# Patient Record
Sex: Female | Born: 1960 | Race: White | Hispanic: No | Marital: Married | Smoking: Former smoker
Health system: Southern US, Community
[De-identification: ages and names within clinical notes are randomized; demographics above are authoritative.]

## PROBLEM LIST (undated history)

## (undated) DIAGNOSIS — M1990 Unspecified osteoarthritis, unspecified site: Secondary | ICD-10-CM

## (undated) DIAGNOSIS — K219 Gastro-esophageal reflux disease without esophagitis: Secondary | ICD-10-CM

## (undated) DIAGNOSIS — Z8719 Personal history of other diseases of the digestive system: Secondary | ICD-10-CM

## (undated) DIAGNOSIS — Z8489 Family history of other specified conditions: Secondary | ICD-10-CM

## (undated) DIAGNOSIS — G629 Polyneuropathy, unspecified: Secondary | ICD-10-CM

## (undated) DIAGNOSIS — S0300XA Dislocation of jaw, unspecified side, initial encounter: Secondary | ICD-10-CM

## (undated) DIAGNOSIS — F419 Anxiety disorder, unspecified: Secondary | ICD-10-CM

## (undated) DIAGNOSIS — Z87442 Personal history of urinary calculi: Secondary | ICD-10-CM

## (undated) DIAGNOSIS — M21371 Foot drop, right foot: Secondary | ICD-10-CM

## (undated) HISTORY — DX: Gastro-esophageal reflux disease without esophagitis: K21.9

## (undated) HISTORY — PX: UMBILICAL HERNIA REPAIR: SHX2598

## (undated) HISTORY — PX: TUBAL LIGATION: SHX77

## (undated) HISTORY — PX: FOOT SURGERY: SHX648

## (undated) HISTORY — PX: OTHER SURGICAL HISTORY: SHX169

---

## 1994-12-31 HISTORY — PX: BREAST BIOPSY: SHX20

## 2003-01-15 ENCOUNTER — Encounter: Payer: Self-pay | Admitting: Unknown Physician Specialty

## 2003-01-15 ENCOUNTER — Encounter: Admission: RE | Admit: 2003-01-15 | Discharge: 2003-01-15 | Payer: Self-pay | Admitting: Unknown Physician Specialty

## 2003-01-28 ENCOUNTER — Encounter: Payer: Self-pay | Admitting: Unknown Physician Specialty

## 2003-01-28 ENCOUNTER — Encounter: Admission: RE | Admit: 2003-01-28 | Discharge: 2003-01-28 | Payer: Self-pay | Admitting: Unknown Physician Specialty

## 2004-02-11 ENCOUNTER — Encounter: Admission: RE | Admit: 2004-02-11 | Discharge: 2004-02-11 | Payer: Self-pay | Admitting: Unknown Physician Specialty

## 2006-05-17 ENCOUNTER — Encounter: Admission: RE | Admit: 2006-05-17 | Discharge: 2006-05-17 | Payer: Self-pay | Admitting: Unknown Physician Specialty

## 2006-07-14 ENCOUNTER — Other Ambulatory Visit: Payer: Self-pay

## 2006-07-14 ENCOUNTER — Emergency Department: Payer: Self-pay | Admitting: Emergency Medicine

## 2006-07-15 ENCOUNTER — Ambulatory Visit: Payer: Self-pay | Admitting: Emergency Medicine

## 2006-07-15 ENCOUNTER — Ambulatory Visit: Payer: Self-pay | Admitting: Internal Medicine

## 2006-07-18 ENCOUNTER — Ambulatory Visit: Payer: Self-pay | Admitting: Internal Medicine

## 2006-07-31 ENCOUNTER — Ambulatory Visit: Payer: Self-pay | Admitting: Internal Medicine

## 2006-11-14 ENCOUNTER — Ambulatory Visit: Payer: Self-pay | Admitting: Family Medicine

## 2009-12-06 ENCOUNTER — Encounter: Admission: RE | Admit: 2009-12-06 | Discharge: 2009-12-06 | Payer: Self-pay | Admitting: Family Medicine

## 2011-12-23 ENCOUNTER — Emergency Department: Payer: Self-pay | Admitting: Emergency Medicine

## 2013-12-10 ENCOUNTER — Ambulatory Visit: Payer: Self-pay | Admitting: Family Medicine

## 2013-12-18 ENCOUNTER — Ambulatory Visit: Payer: Self-pay | Admitting: Family Medicine

## 2014-01-21 ENCOUNTER — Ambulatory Visit: Payer: Self-pay | Admitting: Gastroenterology

## 2015-12-25 ENCOUNTER — Encounter: Payer: Self-pay | Admitting: Emergency Medicine

## 2015-12-25 ENCOUNTER — Emergency Department
Admission: EM | Admit: 2015-12-25 | Discharge: 2015-12-25 | Disposition: A | Discharge status: Home / Self Care | Payer: Self-pay | Attending: Emergency Medicine | Admitting: Emergency Medicine

## 2015-12-25 DIAGNOSIS — R6884 Jaw pain: Secondary | ICD-10-CM

## 2015-12-25 DIAGNOSIS — M26603 Bilateral temporomandibular joint disorder, unspecified: Secondary | ICD-10-CM | POA: Insufficient documentation

## 2015-12-25 DIAGNOSIS — F17200 Nicotine dependence, unspecified, uncomplicated: Secondary | ICD-10-CM | POA: Insufficient documentation

## 2015-12-25 DIAGNOSIS — F419 Anxiety disorder, unspecified: Secondary | ICD-10-CM | POA: Insufficient documentation

## 2015-12-25 DIAGNOSIS — M26609 Unspecified temporomandibular joint disorder, unspecified side: Secondary | ICD-10-CM

## 2015-12-25 DIAGNOSIS — F329 Major depressive disorder, single episode, unspecified: Secondary | ICD-10-CM | POA: Insufficient documentation

## 2015-12-25 HISTORY — DX: Dislocation of jaw, unspecified side, initial encounter: S03.00XA

## 2015-12-25 HISTORY — DX: Polyneuropathy, unspecified: G62.9

## 2015-12-25 MED ORDER — HYDROCODONE-ACETAMINOPHEN 5-325 MG PO TABS: 1.0000 | ORAL_TABLET | ORAL | Status: DC | PRN

## 2015-12-25 MED ORDER — DOCUSATE SODIUM 100 MG PO CAPS: ORAL_CAPSULE | ORAL | Status: DC

## 2015-12-25 NOTE — Discharge Instructions (Signed)
As we discussed, we cannot help with TMJ-related pain, but we have prescribed medication that may help until you can follow up with a specialist.  We provided the contact information for the dentist at [facility] that specializes in TMJ pain.  We recommend that you call as soon as possible for the next available appointment.  We have also provided additional dental care options below.  Continue to take over-the-counter ibuprofen according to the label instructions on the bottle.  Additionally, take Norco as prescribed for severe pain. Do not drink alcohol, drive or participate in any other potentially dangerous activities while taking this medication as it may make you sleepy. Do not take this medication with any other sedating medications, either prescription or over-the-counter. If you were prescribed Percocet or Vicodin, do not take these with acetaminophen (Tylenol) as it is already contained within these medications.   This medication is an opiate (or narcotic) pain medication and can be habit forming.  Use it as little as possible to achieve adequate pain control.  Do not use or use it with extreme caution if you have a history of opiate abuse or dependence.  If you are on a pain contract with your primary care doctor or a pain specialist, be sure to let them know you were prescribed this medication today from the [facility] Emergency Department.  This medication is intended for your use only - do not give any to anyone else and keep it in a secure place where nobody else, especially children, have access to it.  It will also cause or worsen constipation, so you may want to consider taking an over-the-counter stool softener while you are taking this medication.   OPTIONS FOR DENTAL FOLLOW UP CARE  [place] [facility] and [place] [email or web address]  [phone])  [facility] [phone])  [place] [phone])  [place] [phone])  [facility] [phone]) This clinic caters to the indigent population and is on a lottery system. Location: [employer] [facility], [place], [place], [place] Clinic Hours: Wednesdays from 6pm - 9pm, patients seen by a lottery system. For dates, call or go to [email or web address] Services: Cleanings, fillings and simple extractions. Payment Options: DENTAL WORK IS FREE OF CHARGE. Bring proof of income or support. Best way to get seen: Arrive at 5:15 pm - this is a lottery, NOT first come/first serve, so arriving earlier will not increase your chances of being seen.     [facility] [phone] Select option 1 for emergencies   Location: [facility], [place], [address], [place] Clinic Hours: No walk-ins accepted - call the day before to schedule an appointment. Check in times are 9:30 am and 1:30 pm. Services: Simple extractions, temporary fillings, pulpectomy/pulp debridement, uncomplicated abscess drainage. Payment Options: PAYMENT IS DUE AT THE TIME OF SERVICE.  Fee is usually $100-200, additional surgical procedures (e.g. abscess drainage) may be extra. Cash, checks, Visa/MasterCard accepted.  Can file Medicaid if patient is covered for dental - patient should call case worker to check. No discount for [facility] patients. Best way to get seen: MUST call the day before and get onto the schedule. Can usually be seen the next 1-2 days. No walk-ins accepted.     [place] [phone]   Location: [facility], [place], [facility]  Hours: M, W, Th, F 8am or 1:30pm, Tues 9a or 1:30 - first come/first served. Services: Simple extractions, temporary fillings, uncomplicated  abscess drainage.  You do not need to be an [facility] resident. Payment Options: PAYMENT IS DUE AT THE TIME OF SERVICE. Dental insurance, otherwise sliding scale - bring proof of income or support. Depending on income and treatment needed, cost is usually $50-200. Best way to get seen: Arrive early as it is first come/first served.     [facility] [phone]   Location: [place] [facility] Hours: Mon-Thu 8a-5p Services: Most basic dental services including extractions and fillings. Payment Options: PAYMENT IS DUE AT THE TIME OF SERVICE. Sliding scale, up to 50% off - bring proof if income or support. Medicaid with dental option accepted. Best way to get seen: Call to schedule an appointment, can usually be seen within 2 weeks OR they will try to see walk-ins - show up at [place] or 2p (you may have to wait).     [facility] [place] RESIDENTS ONLY   Location: [facility], [place] [address], [place], [place] [address] Clinic Hours: By appointment only. Monday - Thursday 8am-5pm, Friday 8am-12pm Services: Cleanings, fillings, extractions. Payment Options: PAYMENT IS DUE AT THE TIME OF SERVICE. Cash, Visa or MasterCard. Sliding scale - $30 minimum per service. Best way to get seen: Come in to office, complete packet and make an appointment - need proof of income or support monies for each household member and proof of [facility] residence. Usually takes about a month to get in.     [facility] [phone]   Location: [address] [place] Clinic Hours: Walk-in Urgent Care Dental Services are offered Monday-Friday mornings only. The numbers of emergencies accepted daily is limited to the number of providers available. Maximum 15 - Mondays, Wednesdays & Thursdays Maximum 10 - Tuesdays & Fridays Services: You do not need to be a  [facility] resident to be seen for a dental emergency. Emergencies are defined as pain, swelling, abnormal bleeding, or dental trauma. Walkins will receive x-rays if needed. NOTE: Dental cleaning is not an emergency. Payment Options: PAYMENT IS DUE AT THE TIME OF SERVICE. Minimum co-pay is $40.00 for uninsured patients. Minimum co-pay is $3.00 for Medicaid with dental coverage. Dental Insurance is accepted and must be presented at time of visit. Medicare does not cover dental. Forms of payment: Cash, credit card, checks. Best way to get seen: If not previously registered with the clinic, walk-in dental registration begins at 7:15 am and is on a first come/first serve basis. If previously registered with the clinic, call to make an appointment.     [facility] [place] ONLY   Location: [address] [place], [place] Clinic Hours: Mon-Thu 10a-2p Services: Extractions only! Payment Options: FREE (donations accepted) - bring proof of income or support Best way to get seen: Call and schedule an appointment OR come at 8am on the 1st Monday of every month (except for holidays) when it is first come/first served.     [facility] [phone]   Location: [place], [place] Clinic Hours: Friday mornings Services, Payment Options, Best way to get seen: Call for info

## 2015-12-25 NOTE — ED Notes (Signed)
Pt says she had teeth pulled the end of November; she has a history of TMJ and ever since she had the teeth pulled she's been having jaw pain; taking Ibuprofen with no relief; pt says unable to sleep and pain is worse when she lays down; pt tearful in triage

## 2015-12-25 NOTE — ED Notes (Signed)
Pt says she had wisdom teeth pulled at the end of November, and has a h/x of TMJ. Pt reports similar c/o in the past, but "never a flare-up like this". Pt reports taking 800mg  of Ibuprofen about 3 hrs ago with no relief; pt says unable to sleep and pain is worse when she lays down

## 2015-12-25 NOTE — ED Provider Notes (Signed)
[facility] Emergency Department Provider Note  ____________________________________________  Time seen: Approximately 4:13 AM  I have reviewed the triage vital signs and the nursing notes.   HISTORY  Chief Complaint Jaw Pain    HPI Nicole Rich is a 54 y.o. female with no significant past medical history other than history of TMJ-related pain years ago who presents with severe pain and bilateral jaw and NSAIDs of her face.  She says it is been a gradual onset over the last month, persistent, severe, and nothing makes it better.  She has been trying to take ibuprofen but with no relief.  She states it seems worse at night.  She did have some wisdom teeth pulled about a month ago and has try to follow up with her dentist but her doses that she cannot help her with this kind of pain and that she should see a medical doctor.  The patient is in tears due to her distress at this time.  She denies fever/chills, chest pain, shortness of breath, abdominal pain, nausea/vomiting.She states that it is worse when she tries to chew and eat and that it does occur in both sides.  She can feel a clicking sensation at times and feels like it may be dislocating.  She has no pain up in her temples.  She has had no visual symptoms.  She has no abscesses in her mouth which she is aware.   Past Medical History  Diagnosis Date  . Peripheral neuropathy ([place])   . TMJ (dislocation of temporomandibular joint)     There are no active problems to display for this patient.   Past Surgical History  Procedure Laterality Date  . Tubal ligation    . Umbilical hernia repair    . Cesarean section      No current outpatient prescriptions on file.  Allergies Review of patient's allergies indicates no known allergies.  History reviewed. No pertinent family history.  Social History Social History  Substance Use Topics  . Smoking status: Current Some Day Smoker  . Smokeless tobacco:  None  . Alcohol Use: No    Review of Systems Constitutional: No fever/chills Eyes: No visual changes. ENT: No sore throat.  Severe pain in both sides of her jaws worse with chewing and opening her mouth widely Cardiovascular: Denies chest pain. Respiratory: Denies shortness of breath. Gastrointestinal: No abdominal pain.  No nausea, no vomiting.  No diarrhea.  No constipation. Genitourinary: Negative for dysuria. Musculoskeletal: Negative for back pain. Skin: Negative for rash. Neurological: Negative for headaches, focal weakness or numbness.  10-point ROS otherwise negative.  ____________________________________________   PHYSICAL EXAM:  VITAL SIGNS: ED Triage Vitals  Enc Vitals Group     BP 12/25/15 0259 126/70 mmHg     Pulse Rate 12/25/15 0259 62     Resp 12/25/15 0259 18     Temp 12/25/15 0259 97.5 F (36.4 C)     Temp Source 12/25/15 0259 Oral     SpO2 12/25/15 0259 100 %     Weight 12/25/15 0259 148 lb (67.132 kg)     Height 12/25/15 0259 5\' 5"  (1.651 m)     Head Cir --      Peak Flow --      Pain Score 12/25/15 0301 8     Pain Loc --      Pain Edu? --      Excl. in [place]? --     Constitutional: Alert and oriented. Well appearing and  appears to be uncomfortable and in pain. Eyes: Conjunctivae are normal. PERRL. EOMI. Head: Atraumatic.  No tenderness to palpation of the temples. Nose: No congestion/rhinnorhea. Mouth/Throat: Mucous membranes are moist.  Oropharynx non-erythematous.  No evidence of dental abscesses, and her gums are nontender to palpation.  Soft with no induration submandibularly and under the tongue.  No evidence of Ludwig's angina. Neck: No stridor.   Cardiovascular: Normal rate, regular rhythm. Grossly normal heart sounds.  Good peripheral circulation. Respiratory: Normal respiratory effort.  No retractions. Lungs CTAB. Gastrointestinal: Soft and nontender. No distention. No abdominal bruits. No CVA tenderness. Musculoskeletal: No lower  extremity tenderness nor edema.  No joint effusions. Neurologic:  Normal speech and language. No gross focal neurologic deficits are appreciated.  Skin:  Skin is warm, dry and intact. No rash noted. Psychiatric: Mood is somewhat anxious and depressed likely due to her pain.  ____________________________________________   LABS (all labs ordered are listed, but only abnormal results are displayed)  Labs Reviewed - No data to display ____________________________________________  EKG  Not indicated ____________________________________________  RADIOLOGY   No results found.  ____________________________________________   PROCEDURES  Procedure(s) performed: None  Critical Care performed: No ____________________________________________   INITIAL IMPRESSION / ASSESSMENT AND PLAN / ED COURSE  Pertinent labs & imaging results that were available during my care of the patient were reviewed by me and considered in my medical decision making (see chart for details).  There is no evidence of infection or an acute medical issue.  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  She does not have any dislocation of her mandible at this time.  I explained to her that I cannot treat her chronic problem but I am willing to provide medication to help her with her pain until she can follow up with her dentist.  I review the [place] controlled substance database she has had no controlled substances filled within the last 6 months, so I believe that this is appropriate treatment for her acute on chronic pain.  I provided her with some contact information that may help in terms of dental options including a local TMJ specialist.  I cannot give her narcotic pain medication in the emergency department because she drove herself.  She understands and agrees with plan.  ____________________________________________  FINAL CLINICAL IMPRESSION(S) / ED DIAGNOSES  Final  diagnoses:  Jaw pain  TMJ (temporomandibular joint disorder)      NEW MEDICATIONS STARTED DURING THIS VISIT:  New Prescriptions   No medications on file     [name], MD 12/25/15 [phone]

## 2016-01-04 DIAGNOSIS — R200 Anesthesia of skin: Secondary | ICD-10-CM | POA: Insufficient documentation

## 2016-01-04 DIAGNOSIS — M79603 Pain in arm, unspecified: Secondary | ICD-10-CM | POA: Insufficient documentation

## 2016-01-04 DIAGNOSIS — R29898 Other symptoms and signs involving the musculoskeletal system: Secondary | ICD-10-CM | POA: Insufficient documentation

## 2016-01-04 DIAGNOSIS — R202 Paresthesia of skin: Secondary | ICD-10-CM

## 2016-01-11 ENCOUNTER — Emergency Department (HOSPITAL_COMMUNITY): Payer: Self-pay

## 2016-01-11 ENCOUNTER — Encounter (HOSPITAL_COMMUNITY): Payer: Self-pay | Admitting: Emergency Medicine

## 2016-01-11 ENCOUNTER — Emergency Department (HOSPITAL_COMMUNITY)
Admission: EM | Admit: 2016-01-11 | Discharge: 2016-01-11 | Disposition: A | Discharge status: Home / Self Care | Payer: Self-pay | Attending: Emergency Medicine | Admitting: Emergency Medicine

## 2016-01-11 DIAGNOSIS — G501 Atypical facial pain: Secondary | ICD-10-CM | POA: Insufficient documentation

## 2016-01-11 DIAGNOSIS — Z79899 Other long term (current) drug therapy: Secondary | ICD-10-CM | POA: Insufficient documentation

## 2016-01-11 DIAGNOSIS — F17200 Nicotine dependence, unspecified, uncomplicated: Secondary | ICD-10-CM | POA: Insufficient documentation

## 2016-01-11 DIAGNOSIS — G500 Trigeminal neuralgia: Secondary | ICD-10-CM

## 2016-01-11 DIAGNOSIS — Z87828 Personal history of other (healed) physical injury and trauma: Secondary | ICD-10-CM | POA: Insufficient documentation

## 2016-01-11 DIAGNOSIS — G629 Polyneuropathy, unspecified: Secondary | ICD-10-CM | POA: Insufficient documentation

## 2016-01-11 LAB — CBC WITH DIFFERENTIAL/PLATELET
BASOS PCT: 0 %
Basophils Absolute: 0 10*3/uL (ref 0.0–0.1)
EOS ABS: 0.1 10*3/uL (ref 0.0–0.7)
EOS PCT: 1 %
HCT: 45.1 % (ref 36.0–46.0)
Hemoglobin: 14.8 g/dL (ref 12.0–15.0)
Lymphocytes Relative: 32 %
Lymphs Abs: 3.3 10*3/uL (ref 0.7–4.0)
MCH: 29.9 pg (ref 26.0–34.0)
MCHC: 32.8 g/dL (ref 30.0–36.0)
MCV: 91.1 fL (ref 78.0–100.0)
MONO ABS: 0.6 10*3/uL (ref 0.1–1.0)
MONOS PCT: 5 %
Neutro Abs: 6.4 10*3/uL (ref 1.7–7.7)
Neutrophils Relative %: 62 %
Platelets: 314 10*3/uL (ref 150–400)
RBC: 4.95 MIL/uL (ref 3.87–5.11)
RDW: 13.9 % (ref 11.5–15.5)
WBC: 10.4 10*3/uL (ref 4.0–10.5)

## 2016-01-11 LAB — COMPREHENSIVE METABOLIC PANEL
ALK PHOS: 76 U/L (ref 38–126)
ALT: 16 U/L (ref 14–54)
AST: 23 U/L (ref 15–41)
Albumin: 4 g/dL (ref 3.5–5.0)
Anion gap: 10 (ref 5–15)
BUN: 12 mg/dL (ref 6–20)
CALCIUM: 9.9 mg/dL (ref 8.9–10.3)
CO2: 27 mmol/L (ref 22–32)
CREATININE: 0.59 mg/dL (ref 0.44–1.00)
Chloride: 103 mmol/L (ref 101–111)
GFR calc non Af Amer: >60 mL/min (ref >60)
GLUCOSE: 98 mg/dL (ref 65–99)
Potassium: 4.2 mmol/L (ref 3.5–5.1)
SODIUM: 140 mmol/L (ref 135–145)
Total Bilirubin: 1.1 mg/dL (ref 0.3–1.2)
Total Protein: 7 g/dL (ref 6.5–8.1)

## 2016-01-11 LAB — TSH: TSH: 1.76 u[IU]/mL (ref 0.350–4.500)

## 2016-01-11 LAB — PHOSPHORUS: Phosphorus: 3.6 mg/dL (ref 2.5–4.6)

## 2016-01-11 LAB — MAGNESIUM: MAGNESIUM: 2.2 mg/dL (ref 1.7–2.4)

## 2016-01-11 MED ORDER — HYDROCODONE-ACETAMINOPHEN 5-325 MG PO TABS: 1.0000 | ORAL_TABLET | ORAL | Status: DC | PRN

## 2016-01-11 MED ORDER — HYDROCODONE-ACETAMINOPHEN 5-325 MG PO TABS
2.0000 | ORAL_TABLET | Freq: Once | ORAL | Status: AC
  Administered 2016-01-11: 2 via ORAL
  Filled 2016-01-11: qty 2

## 2016-01-11 NOTE — ED Notes (Signed)
Pt states again from previous note: pt says she had wisdom teeth pulled at the end of November, and has a h/x of TMJ. Pt reports similar c/o in the past, but "never a flare-up like this". Pt has been taking gabapentin per neurologist to increase dosage.Pt says "it makes her feel drunk." Pt is suppose to have a MRI in 3 mos. Pt states "she can't wait that long."

## 2016-01-11 NOTE — ED Notes (Signed)
Phlebotomy at bedside.

## 2016-01-11 NOTE — Discharge Instructions (Signed)
Neuropathic Pain Neuropathic pain is pain caused by damage to the nerves that are responsible for certain sensations in your body (sensory nerves). The pain can be caused by damage to:   The sensory nerves that send signals to your spinal cord and brain (peripheral nervous system).  The sensory nerves in your brain or spinal cord (central nervous system). Neuropathic pain can make you more sensitive to pain. What would be a minor sensation for most people may feel very painful if you have neuropathic pain. This is usually a long-term condition that can be difficult to treat. The type of pain can differ from person to person. It may start suddenly (acute), or it may develop slowly and last for a long time (chronic). Neuropathic pain may come and go as damaged nerves heal or may stay at the same level for years. It often causes emotional distress, loss of sleep, and a lower quality of life. CAUSES  The most common cause of damage to a sensory nerve is diabetes. Many other diseases and conditions can also cause neuropathic pain. Causes of neuropathic pain can be classified as:  Toxic. Many drugs and chemicals can cause toxic damage. The most common cause of toxic neuropathic pain is damage from drug treatment for cancer (chemotherapy).  Metabolic. This type of pain can happen when a disease causes imbalances that damage nerves. Diabetes is the most common of these diseases. Vitamin B deficiency caused by long-term alcohol abuse is another common cause.  Traumatic. Any injury that cuts, crushes, or stretches a nerve can cause damage and pain. A common example is feeling pain after losing an arm or leg (phantom limb pain).  Compression-related. If a sensory nerve gets trapped or compressed for a long period of time, the blood supply to the nerve can be cut off.  Vascular. Many blood vessel diseases can cause neuropathic pain by decreasing blood supply and oxygen to nerves.  Autoimmune. This type of  pain results from diseases in which the body's defense system mistakenly attacks sensory nerves. Examples of autoimmune diseases that can cause neuropathic pain include lupus and multiple sclerosis.  Infectious. Many types of viral infections can damage sensory nerves and cause pain. Shingles infection is a common cause of this type of pain.  Inherited. Neuropathic pain can be a symptom of many diseases that are passed down through families (genetic). SIGNS AND SYMPTOMS  The main symptom is pain. Neuropathic pain is often described as:  Burning.  Shock-like.  Stinging.  Hot or cold.  Itching. DIAGNOSIS  No single test can diagnose neuropathic pain. Your health care provider will do a physical exam and ask you about your pain. You may use a pain scale to describe how bad your pain is. You may also have tests to see if you have a high sensitivity to pain and to help find the cause and location of any sensory nerve damage. These tests may include:  Imaging studies, such as:  X-rays.  CT scan.  MRI.  Nerve conduction studies to test how well nerve signals travel through your sensory nerves (electrodiagnostic testing).  Stimulating your sensory nerves through electrodes on your skin and measuring the response in your spinal cord and brain (somatosensory evoked potentials). TREATMENT  Treatment for neuropathic pain may change over time. You may need to try different treatment options or a combination of treatments. Some options include:  Over-the-counter pain relievers.  Prescription medicines. Some medicines used to treat other conditions may also help neuropathic pain. These   include medicines to:  Control seizures (anticonvulsants).  Relieve depression (antidepressants).  Prescription-strength pain relievers (narcotics). These are usually used when other pain relievers do not help.  Transcutaneous nerve stimulation (TENS). This uses electrical currents to block painful nerve  signals. The treatment is painless.  Topical and local anesthetics. These are medicines that numb the nerves. They can be injected as a nerve block or applied to the skin.  Alternative treatments, such as:  Acupuncture.  Meditation.  Massage.  Physical therapy.  Pain management programs.  Counseling. HOME CARE INSTRUCTIONS  Learn as much as you can about your condition.  Take medicines only as directed by your health care provider.  Work closely with all your health care providers to find what works best for you.  Have a good support system at home.  Consider joining a chronic pain support group. SEEK MEDICAL CARE IF:  Your pain treatments are not helping.  You are having side effects from your medicines.  You are struggling with fatigue, mood changes, depression, or anxiety.   This information is not intended to replace advice given to you by your health care provider. Make sure you discuss any questions you have with your health care provider.   Document Released: 09/13/2004 Document Revised: 01/07/2015 Document Reviewed: 05/27/2014 Elsevier Interactive Patient Education 2016 [employer]  

## 2016-01-11 NOTE — ED Provider Notes (Signed)
CSN: [id number]     Arrival date & time 01/11/16  [id number] History   First MD Initiated Contact with Patient 01/11/16 1000     Chief Complaint  Patient presents with  . Neurologic Problem    right side of face pain     (Consider location/radiation/quality/duration/timing/severity/associated sxs/prior Treatment) HPI Patient has been getting episodes of facial pain since Christmas. These occur on the right side. She reports a start around the ear and the side of her head. They become intense and involve her eye, mouth, throat, neck and posterior head. She reports it may last from 10 minutes to an hour with intense pain. She does not have other dysfunction of extremities or legs during these events. Patient has seen her neurologist and been evaluated. As been started on Neurontin without improvement. She reports that they had discussed doing an MRI within the next several weeks but the pain has become so intense that she is getting 3-4 episodes per day. Past Medical History  Diagnosis Date  . Peripheral neuropathy ([employer])   . TMJ (dislocation of temporomandibular joint)    Past Surgical History  Procedure Laterality Date  . Tubal ligation    . Umbilical hernia repair    . Cesarean section     History reviewed. No pertinent family history. Social History  Substance Use Topics  . Smoking status: Current Some Day Smoker  . Smokeless tobacco: None  . Alcohol Use: No   OB History    No data available     Review of Systems 10 Systems reviewed and are negative for acute change except as noted in the HPI.    Allergies  Review of patient's allergies indicates no known allergies.  Home Medications   Prior to Admission medications   Medication Sig Start Date End Date Taking? Authorizing Provider  ALPRAZolam Duanne Moron) 0.5 MG tablet Take 0.5 mg by mouth 2 (two) times daily as needed for anxiety.   Yes Historical Provider, MD  Cyanocobalamin (VITAMIN B 12 PO) Take 1 tablet by mouth daily.    Yes Historical Provider, MD  docusate sodium (COLACE) 100 MG capsule Take 1 tablet once or twice daily as needed for constipation while taking narcotic pain medicine 12/25/15  Yes [name], MD  gabapentin (NEURONTIN) 100 MG capsule Take 300 mg by mouth 3 (three) times daily.   Yes Historical Provider, MD  pantoprazole (PROTONIX) 40 MG tablet Take 40 mg by mouth daily.   Yes Historical Provider, MD  HYDROcodone-acetaminophen (NORCO/VICODIN) 5-325 MG tablet Take 1-2 tablets by mouth every 4 (four) hours as needed. 01/11/16   Charlesetta Shanks, MD   BP 103/74 mmHg  Pulse 62  Temp(Src) 97.7 F (36.5 C) (Oral)  Resp 17  Wt 148 lb (67.132 kg)  SpO2 97% Physical Exam  Constitutional: She is oriented to person, place, and time. She appears well-developed and well-nourished.  HENT:  Head: Normocephalic and atraumatic.  Eyes: EOM are normal. Pupils are equal, round, and reactive to light.  There is apparent drooping of the right eyelid. Extraocular motions are normal. Pupillary responses are normal. Patient has reproducible sensitivity palpation along the temporal region of her head as well as along her posterior ear and jaw. No palpable abnormality. No skin changes.  Neck: Neck supple. No tracheal deviation present. No thyromegaly present.  Cardiovascular: Normal rate, regular rhythm, normal heart sounds and intact distal pulses.   Pulmonary/Chest: Effort normal and breath sounds normal. No stridor.  Abdominal: Soft. Bowel sounds are normal. She exhibits  no distension. There is no tenderness.  Musculoskeletal: Normal range of motion. She exhibits no edema.  Lymphadenopathy:    She has no cervical adenopathy.  Neurological: She is alert and oriented to person, place, and time. She has normal strength and normal reflexes. She exhibits normal muscle tone. Coordination normal. GCS eye subscore is 4. GCS verbal subscore is 5. GCS motor subscore is 6.  Patient appears a slight drooping of the right  eyelid. There is however no proptosis or edema. The extraocular motions are normal. Closure is normal. The remainder of the cranial nerves are intact. Upper and lower extremity strength testing is 5 out of 5.  Skin: Skin is warm, dry and intact.  Psychiatric: She has a normal mood and affect.    ED Course  Procedures (including critical care time) Labs Review Labs Reviewed  COMPREHENSIVE METABOLIC PANEL  CBC WITH DIFFERENTIAL/PLATELET  MAGNESIUM  PHOSPHORUS  TSH    Imaging Review Mr Brain Wo Contrast (neuro Protocol)  01/11/2016  CLINICAL DATA:  Right facial pain and eye droop EXAM: MRI HEAD WITHOUT CONTRAST MRA HEAD WITHOUT CONTRAST TECHNIQUE: Multiplanar, multiecho pulse sequences of the brain and surrounding structures were obtained without intravenous contrast. Angiographic images of the head were obtained using MRA technique without contrast. COMPARISON:  None. FINDINGS: MRI HEAD FINDINGS Ventricle size is normal.  Cerebral volume is normal. Negative for acute or chronic infarction Negative for demyelinating disease. Normal cerebral white matter. Brainstem and basal ganglia normal. 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 No other areas of hemorrhage. Negative for mass or edema. Pituitary normal. Cavernous sinus normal bilaterally. Normal orbit. Minimal mucosal edema in the paranasal sinuses. MRA HEAD FINDINGS Both vertebral arteries patent to the basilar. Left PICA patent. PICA patent bilaterally. Basilar widely patent. Superior cerebellar and posterior cerebral arteries patent bilaterally without stenosis. Cavernous carotid appears normal bilaterally. Anterior and middle cerebral arteries are patent bilaterally Negative for vascular malformation or aneurysm. IMPRESSION: No acute intracranial abnormality. 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 Otherwise normal study of the brain Negative MRA of the brain  Electronically Signed   By: Franchot Gallo M.D.   On: 01/11/2016 13:39   Mr Virgel Paling (cerebral Arteries)  01/11/2016  CLINICAL DATA:  Right facial pain and eye droop EXAM: MRI HEAD WITHOUT CONTRAST MRA HEAD WITHOUT CONTRAST TECHNIQUE: Multiplanar, multiecho pulse sequences of the brain and surrounding structures were obtained without intravenous contrast. Angiographic images of the head were obtained using MRA technique without contrast. COMPARISON:  None. FINDINGS: MRI HEAD FINDINGS Ventricle size is normal.  Cerebral volume is normal. Negative for acute or chronic infarction Negative for demyelinating disease. Normal cerebral white matter. Brainstem and basal ganglia normal. 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 No other areas of hemorrhage. Negative for mass or edema. Pituitary normal. Cavernous sinus normal bilaterally. Normal orbit. Minimal mucosal edema in the paranasal sinuses. MRA HEAD FINDINGS Both vertebral arteries patent to the basilar. Left PICA patent. PICA patent bilaterally. Basilar widely patent. Superior cerebellar and posterior cerebral arteries patent bilaterally without stenosis. Cavernous carotid appears normal bilaterally. Anterior and middle cerebral arteries are patent bilaterally Negative for vascular malformation or aneurysm. IMPRESSION: No acute intracranial abnormality. 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 Otherwise normal study of the brain Negative MRA of the brain Electronically Signed   By: Franchot Gallo M.D.   On: 01/11/2016  13:39   I have personally reviewed and evaluated these images and lab results as part of my medical decision-making.   EKG Interpretation None     Consult: Discussed with Dr. Melrose Nakayama. Patient's neurologist. He will continue to follow-up with her as an outpatient basis MDM   Final diagnoses:  Facial pain syndrome   EMR has been reviewed. The patient is  working with Dr. Melrose Nakayama of neurology for facial pain. Patient described worsening and advancing symptoms as well as a droop of the right lid. MRI was obtained without any acute abnormality identified. Chemistries are normal without any evidence of electrolyte imbalance. At this time the patient will be given a short course of Vicodin to assist with pain control while she readdress his pain management with her neurologist in family physician that now that MRI are complete and no acute abnormalities are identified. Chronic right microhemorrhage identified, nonacute and not likely related to symptoms. Reviewed management with Dr. Melrose Nakayama.    Charlesetta Shanks, MD 01/11/16 [phone]

## 2016-07-31 DIAGNOSIS — G500 Trigeminal neuralgia: Secondary | ICD-10-CM | POA: Insufficient documentation

## 2016-08-12 IMAGING — MR MR HEAD W/O CM
9 of 12 series · 31 of 48 positions shown · non-contrast
Comparison: None.

CLINICAL DATA: Right facial pain and eye droop

EXAM:
MRI HEAD WITHOUT CONTRAST
MRA HEAD WITHOUT CONTRAST
TECHNIQUE: Multiplanar, multiecho pulse sequences of the brain and surrounding
structures were obtained without intravenous contrast. Angiographic
images of the head were obtained using MRA technique without
contrast.

[Series 3: DWI · axial · 3.0mm · 1.09mm/px · z∈[-63,+68]mm · 6 of 90 slices shown (1 of 4)]
[im 1/90]
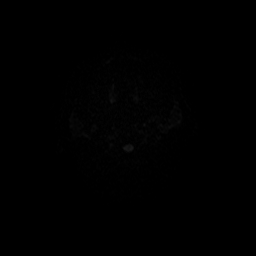
[im 18/90]
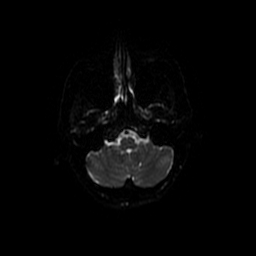
[im 36/90]
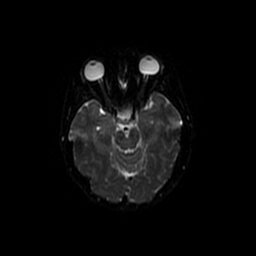
[im 54/90]
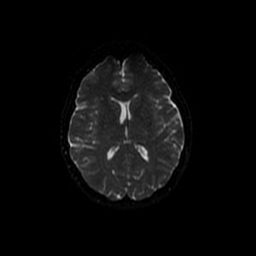
[im 72/90]
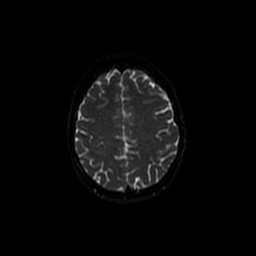
[im 90/90]
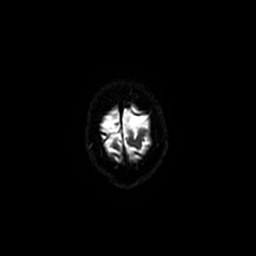

[Series 4: T1 · sagittal · 5.0mm · 0.47mm/px · 2 of 23 slices shown]
[im 1/23]
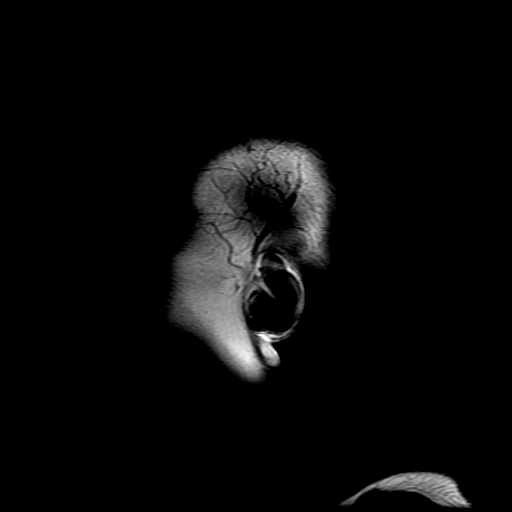
[im 23/23]
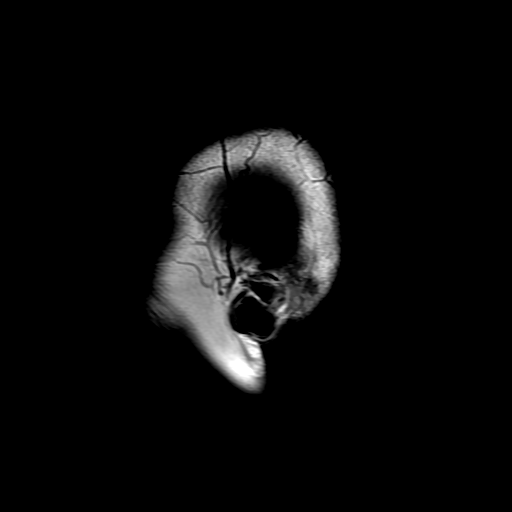

[Series 5: T2 · axial · 5.0mm · 0.43mm/px · z∈[-59,+72]mm · 2 of 23 slices shown]
[im 1/23]
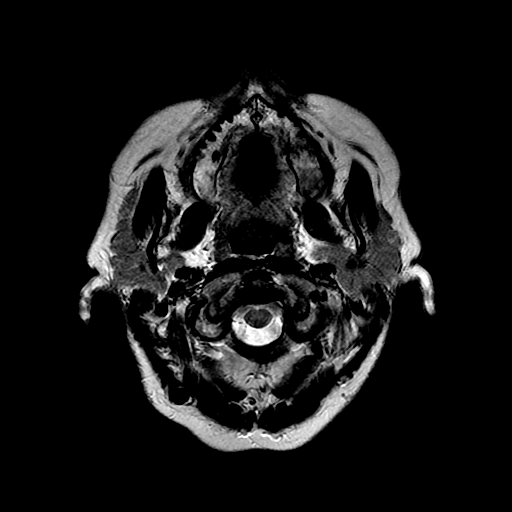
[im 23/23]
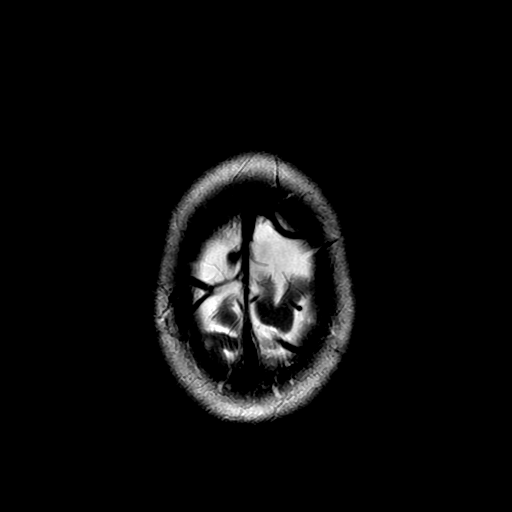

[Series 6: (id) mt fs · axial · 1.4mm · 0.43mm/px · z∈[-76,-19]mm · 5 of 152 slices shown]
[im 1/152]
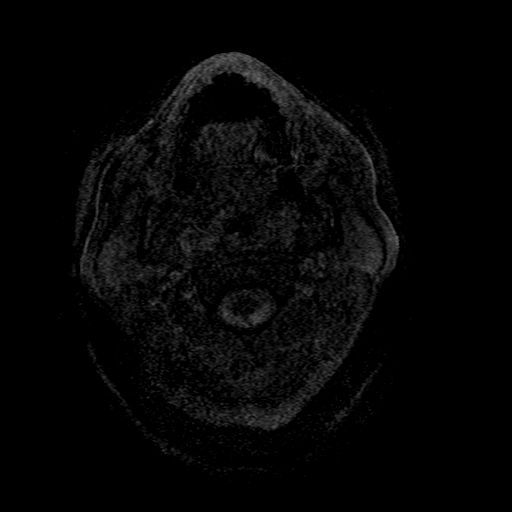
[im 28/152]
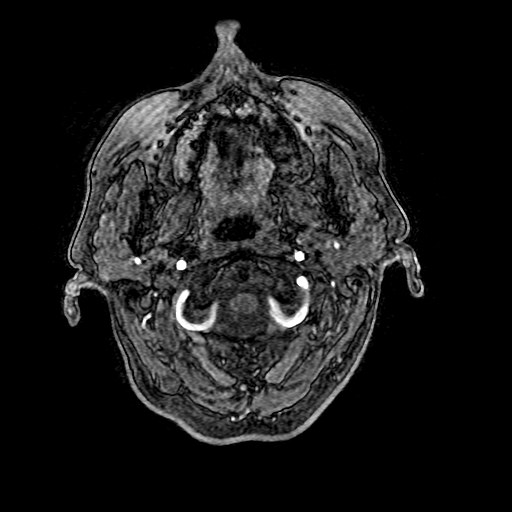
[im 42/152]
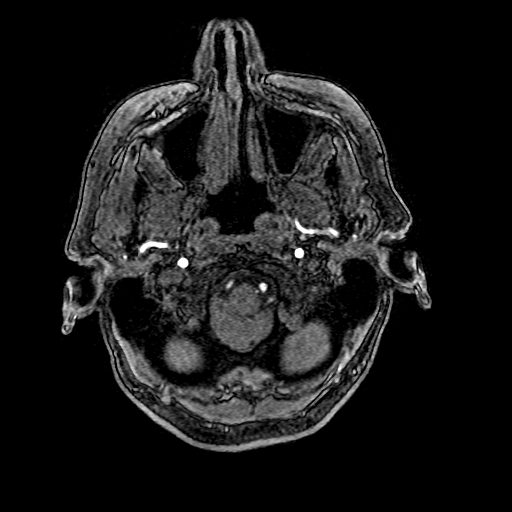
[im 69/152]
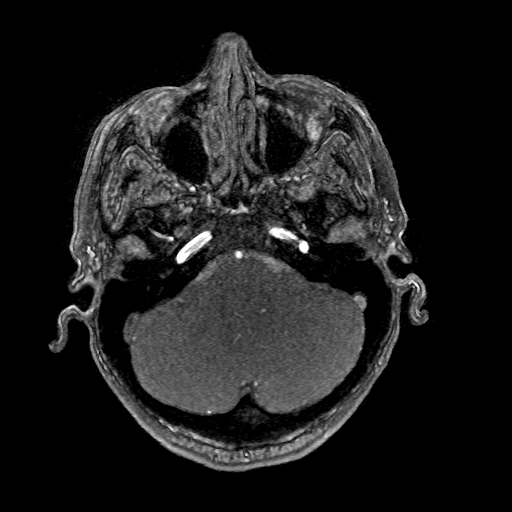
[im 83/152]
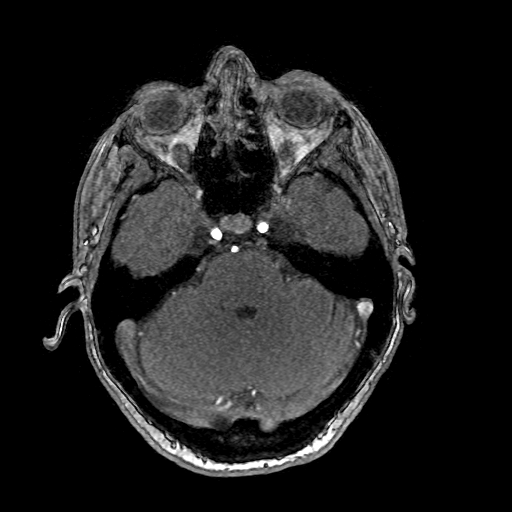

[Series 7: DWI · coronal · 5.0mm · 1.09mm/px · 5 of 62 slices shown (2 of 4)]
[im 1/62]
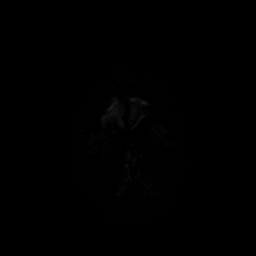
[im 16/62]
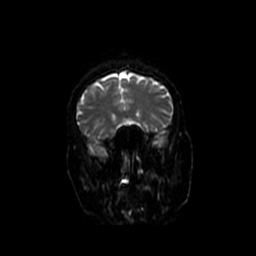
[im 31/62]
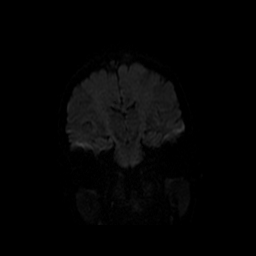
[im 46/62]
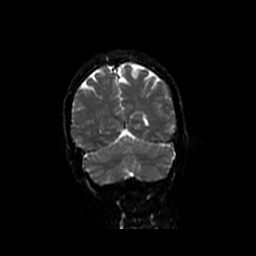
[im 62/62]
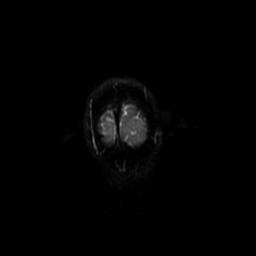

[Series 8: FLAIR · axial · 5.0mm · 0.43mm/px · z∈[-59,+72]mm · 2 of 23 slices shown]
[im 1/23]
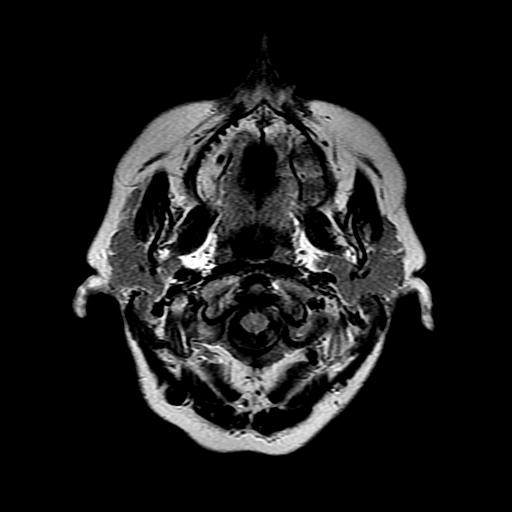
[im 23/23]
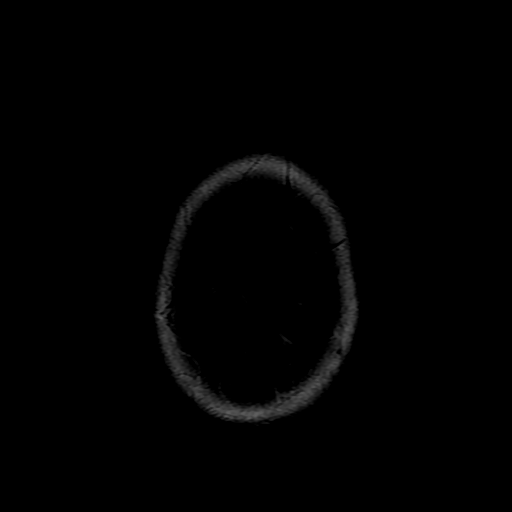

[Series 11: T2 post-contrast · coronal · 5.0mm · 0.39mm/px · 2 of 25 slices shown]
[im 1/25]
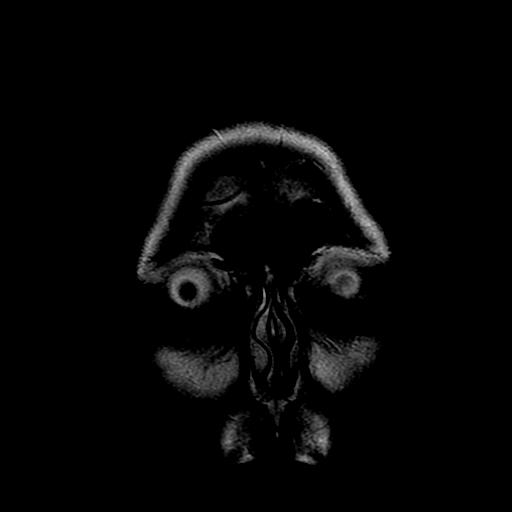
[im 25/25]
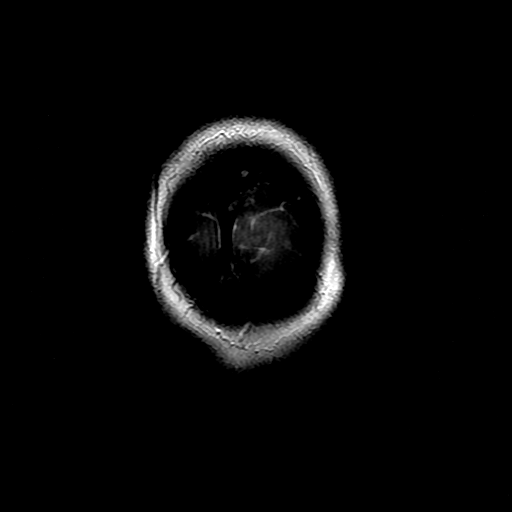

[Series 300: DWI · axial · 3.0mm · 1.09mm/px · z∈[-63,+68]mm · 4 of 45 slices shown (3 of 4)]
[im 1/45]
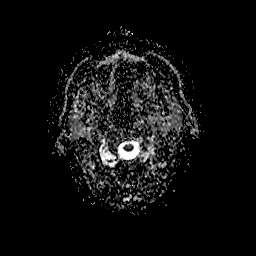
[im 15/45]
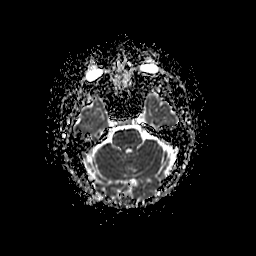
[im 30/45]
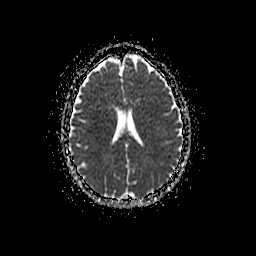
[im 45/45]
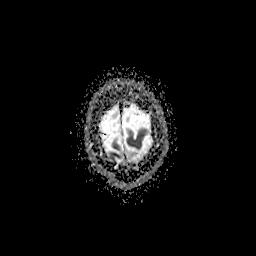

[Series 700: DWI · coronal · 5.0mm · 1.09mm/px · 3 of 31 slices shown (4 of 4)]
[im 1/31]
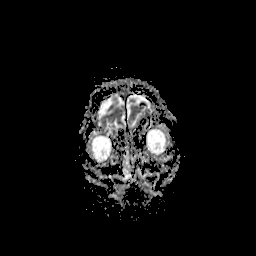
[im 16/31]
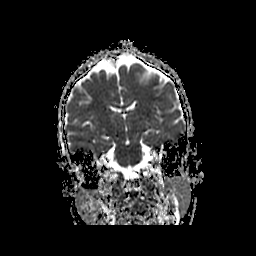
[im 31/31]
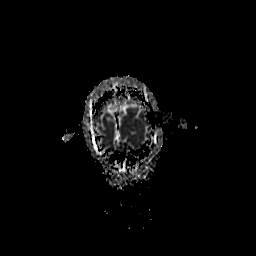

[31 of 48 positions shown; findings below may reference images not displayed]

FINDINGS: MRI HEAD FINDINGS

Ventricle size is normal.  Cerebral volume is normal.

Negative for acute or chronic infarction

Negative for demyelinating disease. Normal cerebral white matter.
Brainstem and basal ganglia normal.

Chronic micro hemorrhage right parietal white matter. No other areas
of hemorrhage.

Negative for mass or edema.

Pituitary normal. Cavernous sinus normal bilaterally. Normal orbit.
Minimal mucosal edema in the paranasal sinuses.

MRA HEAD FINDINGS

Both vertebral arteries patent to the basilar. Left PICA patent.
PICA patent bilaterally. Basilar widely patent. Superior cerebellar
and posterior cerebral arteries patent bilaterally without stenosis.

Cavernous carotid appears normal bilaterally. Anterior and middle
cerebral arteries are patent bilaterally

Negative for vascular malformation or aneurysm.
IMPRESSION: No acute intracranial abnormality. Chronic micro hemorrhage right
parietal white matter which may be due to chronic hypertension or a
small cavernoma. Otherwise normal study of the brain

Negative MRA of the brain

## 2016-09-01 ENCOUNTER — Emergency Department
Admission: EM | Admit: 2016-09-01 | Discharge: 2016-09-01 | Disposition: A | Discharge status: Home / Self Care | Payer: Self-pay | Attending: Emergency Medicine | Admitting: Emergency Medicine

## 2016-09-01 ENCOUNTER — Encounter: Payer: Self-pay | Admitting: Emergency Medicine

## 2016-09-01 DIAGNOSIS — L0231 Cutaneous abscess of buttock: Secondary | ICD-10-CM | POA: Insufficient documentation

## 2016-09-01 DIAGNOSIS — F17200 Nicotine dependence, unspecified, uncomplicated: Secondary | ICD-10-CM | POA: Insufficient documentation

## 2016-09-01 DIAGNOSIS — Z79899 Other long term (current) drug therapy: Secondary | ICD-10-CM | POA: Insufficient documentation

## 2016-09-01 DIAGNOSIS — L03317 Cellulitis of buttock: Secondary | ICD-10-CM | POA: Insufficient documentation

## 2016-09-01 LAB — CBC WITH DIFFERENTIAL/PLATELET
BASOS ABS: 0.1 10*3/uL (ref 0–0.1)
Basophils Relative: 1 %
Eosinophils Absolute: 0.1 10*3/uL (ref 0–0.7)
Eosinophils Relative: 1 %
HEMATOCRIT: 40.6 % (ref 35.0–47.0)
HEMOGLOBIN: 14 g/dL (ref 12.0–16.0)
LYMPHS PCT: 15 %
Lymphs Abs: 2.2 10*3/uL (ref 1.0–3.6)
MCH: 30.8 pg (ref 26.0–34.0)
MCHC: 34.6 g/dL (ref 32.0–36.0)
MCV: 89 fL (ref 80.0–100.0)
MONO ABS: 1.2 10*3/uL — AB (ref 0.2–0.9)
Monocytes Relative: 8 %
NEUTROS ABS: 11.4 10*3/uL — AB (ref 1.4–6.5)
NEUTROS PCT: 75 %
Platelets: 220 10*3/uL (ref 150–440)
RBC: 4.56 MIL/uL (ref 3.80–5.20)
RDW: 13.3 % (ref 11.5–14.5)
WBC: 15.1 10*3/uL — ABNORMAL HIGH (ref 3.6–11.0)

## 2016-09-01 LAB — COMPREHENSIVE METABOLIC PANEL
ALBUMIN: 4.1 g/dL (ref 3.5–5.0)
ALT: 16 U/L (ref 14–54)
ANION GAP: 7 (ref 5–15)
AST: 21 U/L (ref 15–41)
Alkaline Phosphatase: 74 U/L (ref 38–126)
BILIRUBIN TOTAL: 0.7 mg/dL (ref 0.3–1.2)
BUN: 16 mg/dL (ref 6–20)
CO2: 27 mmol/L (ref 22–32)
Calcium: 9.5 mg/dL (ref 8.9–10.3)
Chloride: 103 mmol/L (ref 101–111)
Creatinine, Ser: 0.61 mg/dL (ref 0.44–1.00)
GFR calc non Af Amer: >60 mL/min (ref >60)
GLUCOSE: 79 mg/dL (ref 65–99)
POTASSIUM: 4 mmol/L (ref 3.5–5.1)
SODIUM: 137 mmol/L (ref 135–145)
TOTAL PROTEIN: 7.4 g/dL (ref 6.5–8.1)

## 2016-09-01 MED ORDER — CLINDAMYCIN PHOSPHATE 600 MG/50ML IV SOLN
600.0000 mg | Freq: Once | INTRAVENOUS | Status: AC
  Administered 2016-09-01: 600 mg via INTRAVENOUS
  Filled 2016-09-01: qty 50

## 2016-09-01 MED ORDER — HYDROCODONE-ACETAMINOPHEN 5-325 MG PO TABS: 1.0000 | ORAL_TABLET | ORAL | 0 refills | Status: DC | PRN

## 2016-09-01 MED ORDER — SULFAMETHOXAZOLE-TRIMETHOPRIM 800-160 MG PO TABS: 1.0000 | ORAL_TABLET | Freq: Two times a day (BID) | ORAL | 0 refills | Status: DC

## 2016-09-01 MED ORDER — IBUPROFEN 600 MG PO TABS
600.0000 mg | ORAL_TABLET | Freq: Once | ORAL | Status: AC
  Administered 2016-09-01: 600 mg via ORAL
  Filled 2016-09-01: qty 1

## 2016-09-01 NOTE — Discharge Instructions (Signed)
Warm compresses or sitz bath frequently in warm water. Begin taking antibiotics as directed and finish completely. Norco as needed for pain. Return to the emergency room if any severe worsening or fever, chills, nausea or vomiting. Follow-up with your doctor, Dr. Jamal Collin or [facility]

## 2016-09-01 NOTE — ED Notes (Signed)
Pt was sleeping on nurse's arrival to room . Pt in NAD at this time.

## 2016-09-01 NOTE — ED Notes (Signed)
See triage note, pt c/o painful red area on L butt cheek since Monday.  Area is warm and painful to palpation.  NAD.

## 2016-09-01 NOTE — ED Notes (Signed)
Pt was crying on nurse's arrival to the room. Pt was upset about the infection she found out she had. Nurse spoke and explained why infections like these can happen. Pt is calm at this time and laying on her right side with IV antibiotics infusing.

## 2016-09-01 NOTE — ED Triage Notes (Signed)
Abscess L buttock x 5 days.

## 2016-09-01 NOTE — ED Provider Notes (Signed)
[facility] Emergency Department Provider Note   ____________________________________________   First MD Initiated Contact with Patient 09/01/16 1136     (approximate)  I have reviewed the triage vital signs and the nursing notes.   HISTORY  Chief Complaint Abscess   HPI Nicole Rich is a 55 y.o. female is here with complaint of questionable abscess to her buttocks.Patient states that over the last 5 days it is gradually gotten larger until she is unable to sit without severe pain. Patient is unaware of any fever or chills. There's been no nausea or vomiting. Patient has taken some over-the-counter medication without any relief of her pain. She denies any previous abscesses. Currently she rates her pain as 10 over 10.   Past Medical History:  Diagnosis Date  . Peripheral neuropathy ([place])   . TMJ (dislocation of temporomandibular joint)     There are no active problems to display for this patient.   Past Surgical History:  Procedure Laterality Date  . CESAREAN SECTION    . TUBAL LIGATION    . UMBILICAL HERNIA REPAIR      Prior to Admission medications   Medication Sig Start Date End Date Taking? Authorizing Provider  ALPRAZolam Duanne Moron) 0.5 MG tablet Take 0.5 mg by mouth 2 (two) times daily as needed for anxiety.    Historical Provider, MD  Cyanocobalamin (VITAMIN B 12 PO) Take 1 tablet by mouth daily.    Historical Provider, MD  docusate sodium (COLACE) 100 MG capsule Take 1 tablet once or twice daily as needed for constipation while taking narcotic pain medicine 12/25/15   [name], MD  gabapentin (NEURONTIN) 100 MG capsule Take 300 mg by mouth 3 (three) times daily.    Historical Provider, MD  HYDROcodone-acetaminophen (NORCO/VICODIN) 5-325 MG tablet Take 1 tablet by mouth every 4 (four) hours as needed for moderate pain. 09/01/16   Johnn Hai, PA-C  pantoprazole (PROTONIX) 40 MG tablet Take 40 mg by mouth daily.    Historical  Provider, MD  sulfamethoxazole-trimethoprim (BACTRIM DS,SEPTRA DS) 800-160 MG tablet Take 1 tablet by mouth 2 (two) times daily. 09/01/16   Johnn Hai, PA-C    Allergies Review of patient's allergies indicates no known allergies.  No family history on file.  Social History Social History  Substance Use Topics  . Smoking status: Current Some Day Smoker  . Smokeless tobacco: Not on file  . Alcohol use No    Review of Systems Constitutional: No fever/chills Cardiovascular: Denies chest pain. Respiratory: Denies shortness of breath. Gastrointestinal: No abdominal pain.  No nausea, no vomiting.   Genitourinary: Negative for dysuria. Musculoskeletal: Negative for back pain. Skin: Positive for abscess. Neurological: Negative for headaches, focal weakness or numbness.  10-point ROS otherwise negative.  ____________________________________________   PHYSICAL EXAM:  VITAL SIGNS: ED Triage Vitals  Enc Vitals Group     BP 09/01/16 1102 127/88     Pulse Rate 09/01/16 1102 90     Resp 09/01/16 1102 18     Temp 09/01/16 1102 98 F (36.7 C)     Temp Source 09/01/16 1102 Oral     SpO2 09/01/16 1102 97 %     Weight 09/01/16 1103 153 lb (69.4 kg)     Height 09/01/16 1103 5\' 5"  (1.651 m)     Head Circumference --      Peak Flow --      Pain Score 09/01/16 1103 10     Pain Loc --  Pain Edu? --      Excl. in [place]? --     Constitutional: Alert and oriented. Well appearing and in no acute distress. Eyes: Conjunctivae are normal. PERRL. EOMI. Head: Atraumatic. Nose: No congestion/rhinnorhea. Neck: No stridor.  Cardiovascular: Normal rate, regular rhythm. Grossly normal heart sounds.  Good peripheral circulation. Respiratory: Normal respiratory effort.  No retractions. Lungs CTAB. Gastrointestinal: Soft and nontender. No distention. Musculoskeletal: Moves upper and lower extremities without any difficulty. Normal gait was noted. Neurologic:  Normal speech and language. No  gross focal neurologic deficits are appreciated.  Skin:  Skin is warm, dry and intact. Moderate erythema and tenderness is noted on the left buttocks with a firm nodule midline area. There is warmth to the area and no drainage is noted.There is no fluctuant area noted but cellulitis extending from the area present. Psychiatric: Mood and affect are normal. Speech and behavior are normal.  ____________________________________________   LABS (all labs ordered are listed, but only abnormal results are displayed)  Labs Reviewed  CBC WITH DIFFERENTIAL/PLATELET - Abnormal; Notable for the following:       Result Value   WBC 15.1 (*)    Neutro Abs 11.4 (*)    Monocytes Absolute 1.2 (*)    All other components within normal limits  COMPREHENSIVE METABOLIC PANEL    PROCEDURES  Procedure(s) performed: None  Procedures  Critical Care performed: No  ____________________________________________   INITIAL IMPRESSION / ASSESSMENT AND PLAN / ED COURSE  Pertinent labs & imaging results that were available during my care of the patient were reviewed by me and considered in my medical decision making (see chart for details).    Clinical Course  Patient was given clindamycin IV while in the emergency room and ibuprofen due to patient not having other transportation besides herself. Patient was discharged with a prescription for Norco as needed for pain and Septra DS twice a day for 10 days. Patient is follow-up with Dr. Jamal Collin. She is also encouraged to use warm compresses or sitz baths. She is return to the emergency room over the holiday weekend if any severe worsening of her symptoms or fever, nausea or vomiting.  ___________________________________________   FINAL CLINICAL IMPRESSION(S) / ED DIAGNOSES  Final diagnoses:  Abscess of buttock, left  Cellulitis of buttock      NEW MEDICATIONS STARTED DURING THIS VISIT:  Discharge Medication List as of 09/01/2016  1:42 PM    START  taking these medications   Details  sulfamethoxazole-trimethoprim (BACTRIM DS,SEPTRA DS) 800-160 MG tablet Take 1 tablet by mouth 2 (two) times daily., Starting Sat 09/01/2016, Print         Note:  This document was prepared using Dragon voice recognition software and may include unintentional dictation errors.    Johnn Hai, PA-C 09/01/16 1414    Delman Kitten, MD 09/01/16 1535

## 2017-02-13 ENCOUNTER — Ambulatory Visit
Admission: RE | Admit: 2017-02-13 | Discharge: 2017-02-13 | Disposition: A | Discharge status: Home / Self Care | Payer: Self-pay | Source: Ambulatory Visit | Attending: Oncology | Admitting: Oncology

## 2017-02-13 ENCOUNTER — Encounter (INDEPENDENT_AMBULATORY_CARE_PROVIDER_SITE_OTHER): Payer: Self-pay

## 2017-02-13 ENCOUNTER — Ambulatory Visit: Payer: Self-pay | Attending: Oncology | Admitting: *Deleted

## 2017-02-13 ENCOUNTER — Encounter: Payer: Self-pay | Admitting: *Deleted

## 2017-02-13 VITALS — BP 109/78 | HR 66 | Temp 96.6°F | Resp 18 | Ht 66.54 in | Wt 177.1 lb

## 2017-02-13 DIAGNOSIS — Z0000 Encounter for general adult medical examination without abnormal findings: Secondary | ICD-10-CM

## 2017-02-13 NOTE — Progress Notes (Signed)
Subjective:     Patient ID: Nicole Rich, female   DOB: 1961/05/13, 56 y.o.   MRN: [id number]  HPI   Review of Systems     Objective:   Physical Exam  Pulmonary/Chest: Right breast exhibits no inverted nipple, no mass, no nipple discharge, no skin change and no tenderness. Left breast exhibits no inverted nipple, no mass, no nipple discharge, no skin change and no tenderness. Breasts are asymmetrical.  Left breast larger than the right  Abdominal: There is no splenomegaly or hepatomegaly.  Genitourinary: No labial fusion. There is no rash, tenderness, lesion or injury on the right labia. There is no rash, tenderness, lesion or injury on the left labia. Cervix exhibits no motion tenderness, no discharge and no friability. Right adnexum displays no mass, no tenderness and no fullness. Left adnexum displays no mass, no tenderness and no fullness. No erythema, tenderness or bleeding in the vagina. No foreign body in the vagina. No signs of injury around the vagina. No vaginal discharge found.         Assessment:     56 year old White female presents to [facility] for clinical breast exam, pap and mammogram.  Patient with bilateral braces on her wrist.  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  Clinical breast exam unremarkable.  Taught self breast awareness.  Specimen collected for pap smear without difficulty.  Patient has been screened for eligibility.  She does not have any insurance, Medicare or Medicaid.  She also meets financial eligibility.  Hand-out given on the Affordable Care Act.    Plan:     Screening mammogram ordered.  Specimen for pap sent to the lab.  Will follow-up per [facility] protocol.

## 2017-02-13 NOTE — Patient Instructions (Signed)
Human Papillomavirus Human papillomavirus (HPV) is the most common sexually transmitted infection (STI) and is highly contagious. HPV infections cause genital warts and cancers to the outlet of the womb (cervix), birth canal (vagina), opening of the birth canal (vulva), and anus. There are over 100 types of HPV. Unless wartlike lesions are present in the throat or there are genital warts that you can see or feel, HPV usually does not cause symptoms. It is possible to be infected for long periods and pass it on to others without knowing it. What are the causes? HPV is spread from person to person through sexual contact. This includes oral, vaginal, or anal sex. What increases the risk?  Having unprotected sex. HPV can be spread by oral, vaginal, or anal sex.  Having several sex partners.  Having a sex partner who has other sex partners.  Having or having had another sexually transmitted infection. What are the signs or symptoms? Most people carrying HPV do not have any symptoms. If symptoms are present, symptoms may include:  Wartlike lesions in the throat (from having oral sex).  Warts in the infected skin or mucous membranes.  Genital warts that may itch, burn, or bleed.  Genital warts that may be painful or bleed during sexual intercourse. How is this diagnosed? If wartlike lesions are present in the throat or genital warts are present, your health care provider can usually diagnose HPV by physical examination.  Genital warts are easily seen with the naked eye.  Currently, there is no FDA-approved test to detect HPV in males.  In females, a Pap test can show cells that are infected with HPV.  In females, a scope can be used to view the cervix (colposcopy). A colposcopy can be performed if the pelvic exam or Pap test is abnormal. A sample of tissue may be removed (biopsy) during the colposcopy. How is this treated? There is no treatment for the virus itself. However, there are  treatments for the health problems and symptoms HPV can cause. Your health care provider will follow you closely after you are treated. This is because the HPV can come back and may need treatment again. Treatment of HPV may include:  Medicines, which may be injected or applied in a cream, lotion, or gel form.  Use of a probe to apply extreme cold (cryotherapy).  Application of an intense beam of light (laser treatment).  Use of a probe to apply extreme heat (electrocautery).  Surgery. Follow these instructions at home:  Take medicines only as directed by your health care provider.  Use over-the-counter creams for itching or irritation as directed by your health care provider.  Keep all follow-up visits as directed by your health care provider. This is important.  Do not touch or scratch the warts.  Do not treat genital warts with medicines used for treating hand warts.  Do not have sex while you are being treated.  Do not douche or use tampons during treatment of HPV.  Tell your sex partner about your infection because he or she may also need treatment.  If you become pregnant, tell your health care provider that you have had HPV. Your health care provider will monitor you closely during pregnancy to be sure your baby is safe.  After treatment, use condoms during sex to prevent future infections.  Have only one sex partner.  Have a sex partner who does not have other sex partners. How is this prevented?  Talk to your health care provider about getting  the HPV vaccines. These vaccines prevent some HPV infections and cancers. It is recommended that the vaccine be given to males and females between the ages of 31 and 45 years old. It will not work if you already have HPV, and it is not recommended for pregnant women.  A Pap test is done to screen for cervical cancer in women.  The first Pap test should be done at age 72 years.  Between ages 2 and 60 years, Pap tests are  repeated every 2 years.  Beginning at age 6, you are advised to have a Pap test every 3 years as long as your past 3 Pap tests have been normal.  Some women have medical problems that increase the chance of getting cervical cancer. Talk to your health care provider about these problems. It is especially important to talk to your health care provider if a new problem develops soon after your last Pap test. In these cases, your health care provider may recommend more frequent screening and Pap tests.  The above recommendations are the same for women who have or have not gotten the vaccine for HPV.  If you had a hysterectomy for a problem that was not a cancer or a condition that could lead to cancer, then you no longer need Pap tests. However, even if you no longer need a Pap test, a regular exam is a good idea to make sure no other problems are starting.  If you are between the ages of 51 and 83 years and you have had normal Pap tests going back 10 years, you no longer need Pap tests. However, even if you no longer need a Pap test, a regular exam is a good idea to make sure no other problems are starting.  If you have had past treatment for cervical cancer or a condition that could lead to cancer, you need Pap tests and screening for cancer for at least 20 years after your treatment.  If Pap tests have been discontinued, risk factors (such as a new sexual partner)need to be reassessed to determine if screening should be resumed.  Some women may need screenings more often if they are at high risk for cervical cancer. Contact a health care provider if:  The treated skin becomes red, swollen, or painful.  You have a fever.  You feel generally ill.  You feel lumps or pimple-like projections in and around your genital area.  You develop bleeding of the vagina or the treatment area.  You have painful sexual intercourse. This information is not intended to replace advice given to you by your  health care provider. Make sure you discuss any questions you have with your health care provider. Document Released: 03/08/2004 Document Revised: 05/24/2016 Document Reviewed: 03/24/2014 Elsevier Interactive Patient Education  2017 [place] patient hand-out, Women Staying Healthy, Active and Well from [employer], with education on breast health, pap smears, heart and colon health.

## 2017-02-15 LAB — PAP LB AND HPV HIGH-RISK
HPV, high-risk: NEGATIVE
PAP SMEAR COMMENT: 0

## 2017-02-26 ENCOUNTER — Encounter: Payer: Self-pay | Admitting: *Deleted

## 2017-02-26 NOTE — Progress Notes (Signed)
Letter mailed to inform patient of her normal mammogram and pap smear.  She is to follow-up in one year with annual screening.  Next pap in  5 years.  [facility] to [place].

## 2017-12-03 ENCOUNTER — Other Ambulatory Visit: Payer: Self-pay | Admitting: Neurology

## 2017-12-03 DIAGNOSIS — I639 Cerebral infarction, unspecified: Secondary | ICD-10-CM

## 2017-12-03 DIAGNOSIS — R531 Weakness: Secondary | ICD-10-CM

## 2017-12-04 ENCOUNTER — Ambulatory Visit
Admission: RE | Admit: 2017-12-04 | Discharge: 2017-12-04 | Disposition: A | Discharge status: Home / Self Care | Payer: 59 | Source: Ambulatory Visit | Attending: Neurology | Admitting: Neurology

## 2017-12-04 DIAGNOSIS — I639 Cerebral infarction, unspecified: Secondary | ICD-10-CM | POA: Insufficient documentation

## 2017-12-04 DIAGNOSIS — R531 Weakness: Secondary | ICD-10-CM

## 2017-12-12 DIAGNOSIS — G629 Polyneuropathy, unspecified: Secondary | ICD-10-CM | POA: Insufficient documentation

## 2017-12-12 DIAGNOSIS — R5383 Other fatigue: Secondary | ICD-10-CM | POA: Insufficient documentation

## 2017-12-19 ENCOUNTER — Other Ambulatory Visit: Payer: Self-pay | Admitting: Neurology

## 2017-12-19 DIAGNOSIS — R200 Anesthesia of skin: Secondary | ICD-10-CM

## 2017-12-19 DIAGNOSIS — R202 Paresthesia of skin: Principal | ICD-10-CM

## 2017-12-27 ENCOUNTER — Ambulatory Visit
Admission: RE | Admit: 2017-12-27 | Discharge: 2017-12-27 | Disposition: A | Discharge status: Home / Self Care | Payer: 59 | Source: Ambulatory Visit | Attending: Neurology | Admitting: Neurology

## 2017-12-27 DIAGNOSIS — R200 Anesthesia of skin: Secondary | ICD-10-CM | POA: Insufficient documentation

## 2017-12-27 DIAGNOSIS — M5030 Other cervical disc degeneration, unspecified cervical region: Secondary | ICD-10-CM | POA: Diagnosis not present

## 2017-12-27 DIAGNOSIS — R202 Paresthesia of skin: Secondary | ICD-10-CM | POA: Diagnosis present

## 2018-01-14 ENCOUNTER — Ambulatory Visit
Admission: RE | Admit: 2018-01-14 | Discharge: 2018-01-14 | Disposition: A | Discharge status: Home / Self Care | Payer: 59 | Source: Ambulatory Visit | Attending: Family Medicine | Admitting: Family Medicine

## 2018-01-14 ENCOUNTER — Other Ambulatory Visit: Payer: Self-pay | Admitting: Family Medicine

## 2018-01-14 DIAGNOSIS — M5416 Radiculopathy, lumbar region: Secondary | ICD-10-CM

## 2018-01-14 DIAGNOSIS — M549 Dorsalgia, unspecified: Secondary | ICD-10-CM | POA: Diagnosis present

## 2018-01-14 DIAGNOSIS — M47896 Other spondylosis, lumbar region: Secondary | ICD-10-CM | POA: Insufficient documentation

## 2018-01-28 ENCOUNTER — Ambulatory Visit: Payer: 59 | Admitting: Nurse Practitioner

## 2018-01-28 ENCOUNTER — Encounter: Payer: Self-pay | Admitting: Nurse Practitioner

## 2018-01-28 VITALS — BP 120/82 | HR 74 | Resp 16 | Ht 65.0 in | Wt 147.0 lb

## 2018-01-28 DIAGNOSIS — K219 Gastro-esophageal reflux disease without esophagitis: Secondary | ICD-10-CM | POA: Insufficient documentation

## 2018-01-28 DIAGNOSIS — M5116 Intervertebral disc disorders with radiculopathy, lumbar region: Secondary | ICD-10-CM | POA: Diagnosis not present

## 2018-01-28 DIAGNOSIS — F419 Anxiety disorder, unspecified: Secondary | ICD-10-CM | POA: Insufficient documentation

## 2018-01-28 MED ORDER — ALPRAZOLAM 0.5 MG PO TABS: 0.5000 mg | ORAL_TABLET | Freq: Two times a day (BID) | ORAL | 3 refills | Status: DC | PRN

## 2018-01-28 NOTE — Progress Notes (Addendum)
[facility] [place], [place] [address]  Internal MEDICINE  Office Visit Note  Patient Name: Nicole Rich  [id number]  [id number]  Date of Service: 01/28/2018  Chief Complaint  Patient presents with  . Back Pain    radiates into right leg. having numbness and cramping in her right foot.     Low back pain with cramping and numbness in right foot and lower le happened suddenly during the night. She did have MRI of the brain which did not show evidence of stroke. MRI of cervical spine did show moderate disc bulging at multiple levels. 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 She is scheduled to have EMG doe in 01/2018, but weakness and cramping continue to get worse.. She has had had some physical therapy for her lower back, but thye won't do nay more therapy until problem with her leg is figured out. Gabapentin dose increased and started on nortriptyline per Dr. Melrose Nakayama. She will see him back in about 6 weeks.    Back Pain  This is a recurrent problem. The current episode started more than 1 month ago. The problem occurs constantly. The problem has been gradually worsening since onset. The pain is present in the lumbar spine. The quality of the pain is described as burning. The pain radiates to the right foot and right thigh. The pain is at a severity of 5/10. The pain is moderate. The pain is worse during the night. The symptoms are aggravated by standing and sitting. Stiffness is present all day. Associated symptoms include leg pain, numbness, tingling and weakness. Pertinent negatives include no abdominal pain, chest pain or dysuria. Treatments tried: physical therapy.  The treatment provided mild relief.    Pt is here for routine follow up.    Current Medication: Outpatient Encounter Medications as of 01/28/2018  Medication Sig  . ALPRAZolam (XANAX) 0.5 MG tablet Take 0.5 mg by mouth 2 (two)  times daily as needed for anxiety.  . Cyanocobalamin (VITAMIN B 12 PO) Take 1 tablet by mouth daily.  Marland Kitchen gabapentin (NEURONTIN) 300 MG capsule Take 300 mg by mouth 3 (three) times daily.  . nortriptyline (PAMELOR) 10 MG capsule Start Nortriptyline (Pamelor) 10 mg nightly for one week, then increase to 20 mg nightly for  . pantoprazole (PROTONIX) 40 MG tablet Take 40 mg by mouth daily.  Marland Kitchen docusate sodium (COLACE) 100 MG capsule Take 1 tablet once or twice daily as needed for constipation while taking narcotic pain medicine (Patient not taking: Reported on 01/28/2018)  . HYDROcodone-acetaminophen (NORCO/VICODIN) 5-325 MG tablet Take 1 tablet by mouth every 4 (four) hours as needed for moderate pain. (Patient not taking: Reported on 01/28/2018)  . sulfamethoxazole-trimethoprim (BACTRIM DS,SEPTRA DS) 800-160 MG tablet Take 1 tablet by mouth 2 (two) times daily. (Patient not taking: Reported on 01/28/2018)  . [DISCONTINUED] gabapentin (NEURONTIN) 100 MG capsule Take 300 mg by mouth 3 (three) times daily.   No facility-administered encounter medications on file as of 01/28/2018.     Surgical History: Past Surgical History:  Procedure Laterality Date  . BREAST BIOPSY Left 1996   NEG  . CESAREAN SECTION    . TUBAL LIGATION    . UMBILICAL HERNIA REPAIR      Medical History: Past Medical History:  Diagnosis Date  . GERD (gastroesophageal reflux disease)   . Peripheral neuropathy   . TMJ (dislocation of temporomandibular joint)     Family  History: Family History  Problem Relation Age of Onset  . Breast cancer Neg Hx     Social History   Socioeconomic History  . Marital status: Married    Spouse name: Not on file  . Number of children: Not on file  . Years of education: Not on file  . Highest education level: Not on file  Social Needs  . Financial resource strain: Not on file  . Food insecurity - worry: Not on file  . Food insecurity - inability: Not on file  . Transportation needs -  medical: Not on file  . Transportation needs - non-medical: Not on file  Occupational History  . Not on file  Tobacco Use  . Smoking status: Former Smoker    Types: Cigarettes  . Smokeless tobacco: Never Used  Substance and Sexual Activity  . Alcohol use: No  . Drug use: No  . Sexual activity: Not on file  Other Topics Concern  . Not on file  Social History Narrative  . Not on file    Review of Systems  Constitutional: Positive for activity change. Negative for appetite change, chills, fatigue and unexpected weight change.  HENT: Negative for congestion, postnasal drip, rhinorrhea, sneezing and sore throat.   Eyes: Negative for redness.  Respiratory: Negative for cough, chest tightness and shortness of breath.   Cardiovascular: Negative for chest pain and palpitations.  Gastrointestinal: Negative for abdominal pain, constipation, diarrhea, nausea and vomiting.  Genitourinary: Negative for dysuria and frequency.  Musculoskeletal: Positive for arthralgias and back pain. Negative for joint swelling and neck pain.  Skin: Negative.  Negative for rash.  Allergic/Immunologic: Negative.   Neurological: Positive for tingling, facial asymmetry, weakness and numbness. Negative for tremors.  Hematological: Negative for adenopathy. Does not bruise/bleed easily.  Psychiatric/Behavioral: Negative for behavioral problems (Depression), sleep disturbance and suicidal ideas. The patient is nervous/anxious.     Today's Vitals   01/28/18 0903  BP: 120/82  Pulse: 74  Resp: 16  SpO2: 99%  Weight: 147 lb (66.7 kg)  Height: 5\' 5"  (1.651 m)    Physical Exam  Constitutional: She is oriented to person, place, and time. She appears well-developed and well-nourished. No distress.  HENT:  Head: Normocephalic and atraumatic.  Mouth/Throat: Oropharynx is clear and moist. No oropharyngeal exudate.  Eyes: EOM are normal. Pupils are equal, round, and reactive to light.  Neck: Normal range of motion.  Neck supple. No JVD present. Tracheal tenderness, spinous process tenderness and muscular tenderness present. No tracheal deviation present. No thyromegaly present.  Cardiovascular: Normal rate, regular rhythm and normal heart sounds. Exam reveals no gallop and no friction rub.  No murmur heard. Pulmonary/Chest: Effort normal and breath sounds normal. No respiratory distress. She has no wheezes. She exhibits no tenderness.  Abdominal: Soft. Bowel sounds are normal. There is no tenderness.  Musculoskeletal: Normal range of motion.       Back:  Lymphadenopathy:    She has no cervical adenopathy.  Neurological: She is alert and oriented to person, place, and time.  Skin: Skin is warm and dry. She is not diaphoretic.  Psychiatric: Her behavior is normal. Judgment and thought content normal. Her mood appears anxious.  Nursing note and vitals reviewed.   Assessment/Plan:   1. Intervertebral disc disorder with radiculopathy of lumbar region Reviewed MRI of lumbar spine, ordered per Dr. Manuella Ghazi. 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  - Ambulatory referral to Neurosurgery  2. Anxiety disorder, unspecified type - ALPRAZolam (XANAX) 0.5 MG tablet; Take 1  tablet (0.5 mg total) by mouth 2 (two) times daily as needed for anxiety.  Dispense: 60 tablet; Refill: 3  3. Gastroesophageal reflux disease without esophagitis Using OTC medications to control.   General Counseling: emaley applin understanding of the findings of todays visit and agrees with plan of treatment. I have discussed any further diagnostic evaluation that may be needed or ordered today. We also reviewed her medications today. she has been encouraged to call the office with any questions or concerns that should arise related to todays visit.     Orders Placed This Encounter  Procedures  . Ambulatory referral to Neurosurgery   This patient was seen by Leretha Pol, FNP- C in Collaboration with Dr Lavera Guise as a part of  collaborative care agreement  Time spent: 20  Minutes .   Dr Lavera Guise Internal medicine

## 2018-05-27 ENCOUNTER — Ambulatory Visit: Payer: Self-pay | Admitting: Nurse Practitioner

## 2018-06-17 ENCOUNTER — Encounter: Payer: Self-pay | Admitting: Nurse Practitioner

## 2018-06-17 ENCOUNTER — Ambulatory Visit: Payer: 59 | Admitting: Nurse Practitioner

## 2018-06-17 ENCOUNTER — Other Ambulatory Visit: Payer: Self-pay | Admitting: Nurse Practitioner

## 2018-06-17 VITALS — BP 105/73 | HR 74 | Resp 16 | Ht 65.0 in | Wt 139.2 lb

## 2018-06-17 DIAGNOSIS — M5116 Intervertebral disc disorders with radiculopathy, lumbar region: Secondary | ICD-10-CM

## 2018-06-17 DIAGNOSIS — G629 Polyneuropathy, unspecified: Secondary | ICD-10-CM | POA: Diagnosis not present

## 2018-06-17 DIAGNOSIS — Z1239 Encounter for other screening for malignant neoplasm of breast: Secondary | ICD-10-CM

## 2018-06-17 DIAGNOSIS — F419 Anxiety disorder, unspecified: Secondary | ICD-10-CM

## 2018-06-17 MED ORDER — ALPRAZOLAM 0.5 MG PO TABS: 0.5000 mg | ORAL_TABLET | Freq: Two times a day (BID) | ORAL | 3 refills | Status: DC | PRN

## 2018-06-17 MED ORDER — NORTRIPTYLINE HCL 10 MG PO CAPS: ORAL_CAPSULE | ORAL | 3 refills | Status: DC

## 2018-06-17 NOTE — Progress Notes (Signed)
[facility] [place], [place] [address]  Internal MEDICINE  Office Visit Note  Patient Name: Nicole Rich  [id number]  [id number]  Date of Service: 12/29/2018   Pt is here for routine follow up.   Chief Complaint  Patient presents with  . intervertebral disc disorder    4 month follow up  . 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  Weakness and cramping continue to get worse.. She has had had some physical therapy for her lower back, but thye won't do nay more therapy until problem with her leg is figured out. Gabapentin dose increased and started on nortriptyline per Dr. Melrose Nakayama. She feels as though gabapentin is not working at all.   Back Pain  This is a recurrent problem. The current episode started more than 1 month ago. The problem occurs constantly. The problem has been gradually worsening since onset. The pain is present in the lumbar spine. The quality of the pain is described as burning and cramping. The pain radiates to the right foot and right thigh. The pain is at a severity of 5/10. The pain is moderate. The pain is worse during the night. The symptoms are aggravated by standing and sitting. Stiffness is present all day. Associated symptoms include leg pain, numbness, tingling and weakness. Pertinent negatives include no abdominal pain, chest pain or dysuria. She has tried analgesics and muscle relaxant (physical therapy. ) for the symptoms. The treatment provided mild relief.       Current Medication: Outpatient Encounter Medications as of 06/17/2018  Medication Sig  . Cyanocobalamin (VITAMIN B 12 PO) Take 1 tablet by mouth daily.  . [DISCONTINUED] ALPRAZolam (XANAX) 0.5 MG tablet Take 1 tablet (0.5 mg total) by mouth 2 (two) times daily as  needed for anxiety.  . [DISCONTINUED] ALPRAZolam (XANAX) 0.5 MG tablet Take 1 tablet (0.5 mg total) by mouth 2 (two) times daily as needed for anxiety. (Patient taking differently: Take 0.5 mg by mouth See admin instructions. Take 0.5 mg by mouth at bedtime. Take 0.5 mg by mouth daily if needed for anxiety)  . [DISCONTINUED] gabapentin (NEURONTIN) 300 MG capsule Take 300 mg by mouth 3 (three) times daily.  . [DISCONTINUED] nortriptyline (PAMELOR) 10 MG capsule Start Nortriptyline (Pamelor) 10 mg nightly for one week, then increase to 20 mg nightly for  . [DISCONTINUED] nortriptyline (PAMELOR) 10 MG capsule Start Nortriptyline (Pamelor) 10 mg nightly for one week, then increase to 20 mg nightly for (Patient taking differently: Take 20 mg by mouth at bedtime. )  . [DISCONTINUED] docusate sodium (COLACE) 100 MG capsule Take 1 tablet once or twice daily as needed for constipation while taking narcotic pain medicine (Patient not taking: Reported on 01/28/2018)  . [DISCONTINUED] HYDROcodone-acetaminophen (NORCO/VICODIN) 5-325 MG tablet Take 1 tablet by mouth every 4 (four) hours as needed for moderate pain. (Patient not taking: Reported on 01/28/2018)  . [DISCONTINUED] sulfamethoxazole-trimethoprim (BACTRIM DS,SEPTRA DS) 800-160 MG tablet Take 1 tablet by mouth 2 (two) times daily. (Patient not taking: Reported on 01/28/2018)   No facility-administered encounter medications on file as of 06/17/2018.     Surgical History: Past Surgical History:  Procedure Laterality Date  . BREAST BIOPSY Left 1996   NEG  . CESAREAN SECTION    .  CYSTOSCOPY/URETEROSCOPY/HOLMIUM LASER/STENT PLACEMENT Right 08/13/2018   Procedure: CYSTOSCOPY/URETEROSCOPY/HOLMIUM LASER/STENT PLACEMENT;  Surgeon: Hollice Espy, MD;  Location: [facility] ORS;  Service: Urology;  Laterality: Right;  . FOOT SURGERY Right    mortons neuroma  . TUBAL LIGATION    . UMBILICAL HERNIA REPAIR      Medical History: Past Medical History:  Diagnosis Date    . Anxiety   . Arthritis    neck  . Family history of adverse reaction to anesthesia    dad can only stay sedated for a certain amount of time per pt  . Foot drop, right foot    WEARS BRACE  . GERD (gastroesophageal reflux disease)   . History of hiatal hernia   . History of kidney stones   . Peripheral neuropathy   . TMJ (dislocation of temporomandibular joint)     Family History: Family History  Problem Relation Age of Onset  . Cancer Mother   . Diabetes Father   . Hypertension Father   . Cancer Brother   . Breast cancer Neg Hx     Social History   Socioeconomic History  . Marital status: Married    Spouse name: Not on file  . Number of children: Not on file  . Years of education: Not on file  . Highest education level: Not on file  Occupational History  . Not on file  Social Needs  . Financial resource strain: Not on file  . Food insecurity:    Worry: Not on file    Inability: Not on file  . Transportation needs:    Medical: Not on file    Non-medical: Not on file  Tobacco Use  . Smoking status: Former Smoker    Packs/day: 0.50    Years: 40.00    Pack years: 20.00    Types: Cigarettes    Last attempt to quit: 08/06/2016    Years since quitting: 2.3  . Smokeless tobacco: Never Used  Substance and Sexual Activity  . Alcohol use: No  . Drug use: No  . Sexual activity: Yes  Lifestyle  . Physical activity:    Days per week: 7 days    Minutes per session: 60 min  . Stress: Not at all  Relationships  . Social connections:    Talks on phone: Three times a week    Gets together: Three times a week    Attends religious service: Never    Active member of club or organization: No    Attends meetings of clubs or organizations: Never    Relationship status: Married  . Intimate partner violence:    Fear of current or ex partner: No    Emotionally abused: No    Physically abused: No    Forced sexual activity: No  Other Topics Concern  . Not on file  Social  History Narrative  . Not on file      Review of Systems  Constitutional: Positive for activity change and fatigue. Negative for appetite change, chills and unexpected weight change.  HENT: Negative for congestion, postnasal drip, rhinorrhea, sneezing and sore throat.   Respiratory: Negative for cough, chest tightness and shortness of breath.   Cardiovascular: Negative for chest pain and palpitations.  Gastrointestinal: Negative for abdominal pain, constipation, diarrhea, nausea and vomiting.  Endocrine: Negative for cold intolerance, heat intolerance, polydipsia and polyuria.  Genitourinary: Negative for dysuria and frequency.  Musculoskeletal: Positive for arthralgias, back pain, gait problem and myalgias. Negative for joint swelling and neck pain.  Skin: Negative for rash.  Allergic/Immunologic: Negative.   Neurological: Positive for tingling, weakness and numbness. Negative for tremors.  Hematological: Negative for adenopathy. Does not bruise/bleed easily.  Psychiatric/Behavioral: Negative for behavioral problems (Depression), sleep disturbance and suicidal ideas. The patient is nervous/anxious.     Today's Vitals   06/17/18 1119  BP: 105/73  Pulse: 74  Resp: 16  SpO2: 96%  Weight: 139 lb 3.2 oz (63.1 kg)  Height: 5\' 5"  (1.651 m)    Physical Exam Vitals signs and nursing note reviewed.  Constitutional:      General: She is not in acute distress.    Appearance: Normal appearance. She is well-developed. She is not diaphoretic.  HENT:     Head: Normocephalic and atraumatic.     Mouth/Throat:     Pharynx: No oropharyngeal exudate.  Eyes:     Extraocular Movements: Extraocular movements intact.     Pupils: Pupils are equal, round, and reactive to light.  Neck:     Musculoskeletal: Normal range of motion and neck supple. Spinous process tenderness and muscular tenderness present.     Thyroid: No thyromegaly.     Vascular: No JVD.     Trachea: Tracheal tenderness present.  No tracheal deviation.  Cardiovascular:     Rate and Rhythm: Normal rate and regular rhythm.     Heart sounds: Normal heart sounds. No murmur. No friction rub. No gallop.   Pulmonary:     Effort: Pulmonary effort is normal. No respiratory distress.     Breath sounds: Normal breath sounds. No wheezing.  Chest:     Chest wall: No tenderness.  Abdominal:     General: Bowel sounds are normal.     Palpations: Abdomen is soft.     Tenderness: There is no abdominal tenderness.  Musculoskeletal: Normal range of motion.       Back:     Comments: Moderate tenderness of lower back which gets worse with movement of the torso as well as bending and twisting at the waist. Weakness on right side while walking is noted .  Lymphadenopathy:     Cervical: No cervical adenopathy.  Skin:    General: Skin is warm and dry.  Neurological:     Mental Status: She is alert and oriented to person, place, and time. Mental status is at baseline.  Psychiatric:        Mood and Affect: Mood is anxious.        Behavior: Behavior normal.        Thought Content: Thought content normal.        Judgment: Judgment normal.   Assessment/Plan: 1. Intervertebral disc disorder with radiculopathy of lumbar region Continue regular visits with orthopedics/neurosurgery. Physical therapy as indicated and prescribed   2. Neuropathy Trial of lyrica 75mg  twice daily. Hold gabapentin. Will send prescription to pharmacy if effective.   3. Anxiety disorder, unspecified type May continue alprazolam as needed and as prescribed   General Counseling: jazzmyn filion understanding of the findings of todays visit and agrees with plan of treatment. I have discussed any further diagnostic evaluation that may be needed or ordered today. We also reviewed her medications today. she has been encouraged to call the office with any questions or concerns that should arise related to todays visit.    Counseling:  This patient was seen by  [place] with Dr Lavera Guise as a part of collaborative care agreement  Meds ordered this encounter  Medications  . DISCONTD:  ALPRAZolam (XANAX) 0.5 MG tablet    Sig: Take 1 tablet (0.5 mg total) by mouth 2 (two) times daily as needed for anxiety.    Dispense:  60 tablet    Refill:  3    Order Specific Question:   Supervising Provider    Answer:   Lavera Guise [[id number]]  . DISCONTD: nortriptyline (PAMELOR) 10 MG capsule    Sig: Start Nortriptyline (Pamelor) 10 mg nightly for one week, then increase to 20 mg nightly for    Dispense:  30 capsule    Refill:  3    Order Specific Question:   Supervising Provider    Answer:   Lavera Guise [[id number]]    Time spent: 57 Minutes          Dr Lavera Guise Internal medicine

## 2018-07-11 ENCOUNTER — Other Ambulatory Visit: Payer: Self-pay | Admitting: Nurse Practitioner

## 2018-07-11 ENCOUNTER — Telehealth: Payer: Self-pay

## 2018-07-11 DIAGNOSIS — G629 Polyneuropathy, unspecified: Secondary | ICD-10-CM

## 2018-07-11 MED ORDER — PREGABALIN 75 MG PO CAPS: 75.0000 mg | ORAL_CAPSULE | Freq: Two times a day (BID) | ORAL | 2 refills | Status: DC

## 2018-07-11 NOTE — Telephone Encounter (Signed)
Sent new rx for lyrical 75mg  BID.

## 2018-07-11 NOTE — Progress Notes (Signed)
Sent new rx for lyrical 75mg  BID.

## 2018-07-12 ENCOUNTER — Emergency Department: Payer: 59

## 2018-07-12 ENCOUNTER — Emergency Department
Admission: EM | Admit: 2018-07-12 | Discharge: 2018-07-12 | Disposition: A | Discharge status: Home / Self Care | Payer: 59 | Attending: Emergency Medicine | Admitting: Emergency Medicine

## 2018-07-12 ENCOUNTER — Encounter: Payer: Self-pay | Admitting: Emergency Medicine

## 2018-07-12 ENCOUNTER — Other Ambulatory Visit: Payer: Self-pay

## 2018-07-12 DIAGNOSIS — N200 Calculus of kidney: Secondary | ICD-10-CM | POA: Insufficient documentation

## 2018-07-12 DIAGNOSIS — R112 Nausea with vomiting, unspecified: Secondary | ICD-10-CM | POA: Insufficient documentation

## 2018-07-12 DIAGNOSIS — N201 Calculus of ureter: Secondary | ICD-10-CM | POA: Diagnosis not present

## 2018-07-12 DIAGNOSIS — Z87891 Personal history of nicotine dependence: Secondary | ICD-10-CM | POA: Insufficient documentation

## 2018-07-12 DIAGNOSIS — R109 Unspecified abdominal pain: Secondary | ICD-10-CM | POA: Diagnosis present

## 2018-07-12 DIAGNOSIS — Z79899 Other long term (current) drug therapy: Secondary | ICD-10-CM | POA: Diagnosis not present

## 2018-07-12 LAB — URINALYSIS, ROUTINE W REFLEX MICROSCOPIC: Specific Gravity, Urine: 1.022 (ref 1.005–1.030)

## 2018-07-12 LAB — CBC
HEMATOCRIT: 49.2 % — AB (ref 35.0–47.0)
HEMOGLOBIN: 16.6 g/dL — AB (ref 12.0–16.0)
MCH: 30.4 pg (ref 26.0–34.0)
MCHC: 33.8 g/dL (ref 32.0–36.0)
MCV: 89.9 fL (ref 80.0–100.0)
Platelets: 295 10*3/uL (ref 150–440)
RBC: 5.47 MIL/uL — AB (ref 3.80–5.20)
RDW: 13.6 % (ref 11.5–14.5)
WBC: 18.4 10*3/uL — AB (ref 3.6–11.0)

## 2018-07-12 LAB — COMPREHENSIVE METABOLIC PANEL
ALBUMIN: 4.7 g/dL (ref 3.5–5.0)
ALK PHOS: 77 U/L (ref 38–126)
ALT: 15 U/L (ref 0–44)
ANION GAP: 14 (ref 5–15)
AST: 26 U/L (ref 15–41)
BILIRUBIN TOTAL: 2 mg/dL — AB (ref 0.3–1.2)
BUN: 20 mg/dL (ref 6–20)
CALCIUM: 9.8 mg/dL (ref 8.9–10.3)
CO2: 21 mmol/L — ABNORMAL LOW (ref 22–32)
Chloride: 107 mmol/L (ref 98–111)
Creatinine, Ser: 0.68 mg/dL (ref 0.44–1.00)
GFR calc non Af Amer: >60 mL/min (ref >60)
Glucose, Bld: 116 mg/dL — ABNORMAL HIGH (ref 70–99)
POTASSIUM: 3.8 mmol/L (ref 3.5–5.1)
Sodium: 142 mmol/L (ref 135–145)
TOTAL PROTEIN: 8 g/dL (ref 6.5–8.1)

## 2018-07-12 LAB — LIPASE, BLOOD: LIPASE: 29 U/L (ref 11–51)

## 2018-07-12 MED ORDER — CEPHALEXIN 500 MG PO CAPS: 500.0000 mg | ORAL_CAPSULE | Freq: Two times a day (BID) | ORAL | 0 refills | Status: DC

## 2018-07-12 MED ORDER — ONDANSETRON HCL 4 MG/2ML IJ SOLN
4.0000 mg | Freq: Once | INTRAMUSCULAR | Status: AC
  Administered 2018-07-12: 4 mg via INTRAVENOUS
  Filled 2018-07-12: qty 2

## 2018-07-12 MED ORDER — KETOROLAC TROMETHAMINE 30 MG/ML IJ SOLN
30.0000 mg | Freq: Once | INTRAMUSCULAR | Status: AC
  Administered 2018-07-12: 30 mg via INTRAVENOUS
  Filled 2018-07-12: qty 1

## 2018-07-12 MED ORDER — TAMSULOSIN HCL 0.4 MG PO CAPS: 0.4000 mg | ORAL_CAPSULE | Freq: Every day | ORAL | 0 refills | Status: DC

## 2018-07-12 MED ORDER — ONDANSETRON 4 MG PO TBDP: 4.0000 mg | ORAL_TABLET | Freq: Three times a day (TID) | ORAL | 0 refills | Status: DC | PRN

## 2018-07-12 MED ORDER — OXYCODONE-ACETAMINOPHEN 5-325 MG PO TABS: 1.0000 | ORAL_TABLET | ORAL | 0 refills | Status: DC | PRN

## 2018-07-12 MED ORDER — SODIUM CHLORIDE 0.9 % IV BOLUS
1000.0000 mL | Freq: Once | INTRAVENOUS | Status: AC
  Administered 2018-07-12: 1000 mL via INTRAVENOUS

## 2018-07-12 NOTE — Discharge Instructions (Signed)
As we discussed please follow-up with urology for recheck/reevaluation.  Please bring your stone with you if you are able to capture it.  Return to the emergency department for any fever, worsening pain, or any painful urination.  Please take your medications as needed, as written.  Please take Flomax every day as prescribed.  Drink plenty fluids.  Also please follow-up with your primary care doctor regarding your CT scan findings of left ovarian fullness, we do recommend getting a pelvic ultrasound performed at some point in the near future as a precaution.

## 2018-07-12 NOTE — ED Provider Notes (Addendum)
-----------------------------------------   3:02 PM on 07/12/2018 -----------------------------------------   Assuming care from Dr. Kerman Passey.  In short, Nicole Rich is a 57 y.o. female with a chief complaint of flank pain.  Refer to the original H&P for additional details.  The current plan of care is to follow up on UA and discharge with kidney stone info.    ----------------------------------------- 5:03 PM on 07/12/2018 -----------------------------------------  Somewhat equivocal urinalysis findings.  Probably the results are just due to the gross hematuria, but given that there were a few bacteria present and the morbidity of a UTI associated with kidney stones, I will put her on Keflex. Also ordered urine culture. Continuing with the discharge as per Dr. Lucilla Lame recommendations.  I updated the patient and her family she is very comfortable at this time and agrees with the plan.   [name], MD 07/12/18 1705

## 2018-07-12 NOTE — ED Triage Notes (Signed)
C/O vomiting over night and right flank pain this am.

## 2018-07-12 NOTE — ED Provider Notes (Signed)
[facility] Emergency Department Provider Note  Time seen: 1:22 PM  I have reviewed the triage vital signs and the nursing notes.   HISTORY  Chief Complaint Flank Pain    HPI Nicole Rich is a 57 y.o. female with a past medical history of gastric reflux, peripheral neuropathy, anxiety, presents to the emergency department for right flank pain nausea and vomiting.  According to the patient last night she walked in the middle the night feeling very nauseated and vomited once.  She states this morning she developed sudden severe pain in her right flank, states it was initially in the right lower abdomen but is now in the right mid back.  Denies any fever.  Continues to be nauseated with occasional vomiting.  Denies any diarrhea.  No history of kidney stones.  Denies any dysuria or hematuria.   Past Medical History:  Diagnosis Date  . GERD (gastroesophageal reflux disease)   . Peripheral neuropathy   . TMJ (dislocation of temporomandibular joint)     Patient Active Problem List   Diagnosis Date Noted  . Gastroesophageal reflux disease 01/28/2018  . Anxiety disorder 01/28/2018  . Neuropathy 12/12/2017  . Weakness 12/12/2017  . Numbness and tingling 01/04/2016  . Pain of upper extremity 01/04/2016    Past Surgical History:  Procedure Laterality Date  . BREAST BIOPSY Left 1996   NEG  . CESAREAN SECTION    . TUBAL LIGATION    . UMBILICAL HERNIA REPAIR      Prior to Admission medications   Medication Sig Start Date End Date Taking? Authorizing Provider  ALPRAZolam Duanne Moron) 0.5 MG tablet Take 1 tablet (0.5 mg total) by mouth 2 (two) times daily as needed for anxiety. 06/17/18   Ronnell Freshwater, NP  Cyanocobalamin (VITAMIN B 12 PO) Take 1 tablet by mouth daily.    [provider]  docusate sodium (COLACE) 100 MG capsule Take 1 tablet once or twice daily as needed for constipation while taking narcotic pain medicine Patient not taking: Reported  on 01/28/2018 12/25/15   [name], MD  HYDROcodone-acetaminophen (NORCO/VICODIN) 5-325 MG tablet Take 1 tablet by mouth every 4 (four) hours as needed for moderate pain. Patient not taking: Reported on 01/28/2018 09/01/16   Johnn Hai, PA-C  nortriptyline (PAMELOR) 10 MG capsule Start Nortriptyline (Pamelor) 10 mg nightly for one week, then increase to 20 mg nightly for 06/17/18   Ronnell Freshwater, NP  pregabalin (LYRICA) 75 MG capsule Take 1 capsule (75 mg total) by mouth 2 (two) times daily. 07/11/18   Ronnell Freshwater, NP  sulfamethoxazole-trimethoprim (BACTRIM DS,SEPTRA DS) 800-160 MG tablet Take 1 tablet by mouth 2 (two) times daily. Patient not taking: Reported on 01/28/2018 09/01/16   Johnn Hai, PA-C    No Known Allergies  Family History  Problem Relation Age of Onset  . Breast cancer Neg Hx     Social History Social History   Tobacco Use  . Smoking status: Former Smoker    Types: Cigarettes  . Smokeless tobacco: Never Used  Substance Use Topics  . Alcohol use: No  . Drug use: No    Review of Systems Constitutional: Negative for fever. Cardiovascular: Negative for chest pain. Respiratory: Negative for shortness of breath. Gastrointestinal: Right sided abdominal/flank pain.  Positive for nausea vomiting. Genitourinary: Negative for urinary compaints Musculoskeletal: Right mid back pain Skin: Negative for skin complaints  Neurological: Negative for headache All other ROS negative  ____________________________________________   PHYSICAL EXAM:  VITAL SIGNS: ED Triage Vitals  Enc Vitals Group     BP 07/12/18 1306 132/70     Pulse Rate 07/12/18 1306 (!) 50     Resp 07/12/18 1306 (!) 24     Temp 07/12/18 1306 (!) 97.5 F (36.4 C)     Temp Source 07/12/18 1306 Oral     SpO2 07/12/18 1306 100 %     Weight 07/12/18 1305 135 lb (61.2 kg)     Height 07/12/18 1305 5\' 5"  (1.651 m)     Head Circumference --      Peak Flow --      Pain Score 07/12/18 1304  10     Pain Loc --      Pain Edu? --      Excl. in [place]? --    Constitutional: Alert, mild distress, appears to be in a moderate amount of pain bent over dry heaving at times. Eyes: Normal exam ENT   Head: Normocephalic and atraumatic.   Mouth/Throat: Mucous membranes are moist. Cardiovascular: Normal rate, regular rhythm. No murmur Respiratory: Normal respiratory effort without tachypnea nor retractions. Breath sounds are clear Gastrointestinal: Soft and nontender. No distention.  Moderate right CVA tenderness. Musculoskeletal: Nontender with normal range of motion in all extremities. Neurologic:  Normal speech and language. No gross focal neurologic deficits Skin:  Skin is warm, dry and intact.  Psychiatric: Mood and affect are normal.   ____________________________________________   RADIOLOGY  4 mm right UPJ, left ovarian fullness, unchanged hepatic lesions  ____________________________________________   INITIAL IMPRESSION / ASSESSMENT AND PLAN / ED COURSE  Pertinent labs & imaging results that were available during my care of the patient were reviewed by me and considered in my medical decision making (see chart for details).  Patient presents emergency department for right flank pain nausea vomiting beginning this morning.  Differential would include ureterolithiasis, muscular skeletal pain, appendicitis, colitis or diverticulitis, pyelonephritis urinary tract infection.  We will check labs, dose pain and nausea medication, IV hydrate and obtain a CT renal scan to further evaluate.  Patient's presentation is very consistent with ureterolithiasis but the patient has no history of the same.  Patient CT is consistent with a 4 mm UPJ stone.  I discussed the remainder the CT with the patient including the left ovarian fullness and recommended outpatient ultrasound.  Patient agreeable.  Urinalysis pending.  If the urinalysis is normal anticipate likely discharge home with pain  control nausea control and Flomax.  I discussed this plan with the patient as well as urology follow-up, urine strainer use and return precautions for dysuria fever or worsening abdominal pain. ____________________________________________   FINAL CLINICAL IMPRESSION(S) / ED DIAGNOSES  Flank pain Kidney stone   Harvest Dark, MD 07/12/18 1456

## 2018-07-14 LAB — URINE CULTURE: Special Requests: NORMAL

## 2018-07-14 NOTE — Telephone Encounter (Signed)
PT WAS NOTIFIED. 

## 2018-07-22 ENCOUNTER — Ambulatory Visit: Payer: 59 | Admitting: Adult Health

## 2018-07-22 ENCOUNTER — Encounter: Payer: Self-pay | Admitting: Adult Health

## 2018-07-22 VITALS — BP 104/68 | HR 71 | Resp 16 | Ht 65.0 in | Wt 135.4 lb

## 2018-07-22 DIAGNOSIS — N200 Calculus of kidney: Secondary | ICD-10-CM

## 2018-07-22 DIAGNOSIS — N838 Other noninflammatory disorders of ovary, fallopian tube and broad ligament: Secondary | ICD-10-CM | POA: Diagnosis not present

## 2018-07-22 DIAGNOSIS — K219 Gastro-esophageal reflux disease without esophagitis: Secondary | ICD-10-CM

## 2018-07-22 NOTE — Progress Notes (Signed)
[facility] [place], [place] [address]  Internal MEDICINE  Office Visit Note  Patient Name: Nicole Rich  [id number]  [id number]  Date of Service: 07/22/2018     Chief Complaint  Patient presents with  . Nephrolithiasis    Hospital follow up , enlarged ovary      HPI Pt is here for recent hospital follow up.  She was diagnosed with multiple kidney stones in the emergency department.  She reports continued pain intermittently.  She is using Zofran for nausea and to tolerate her pain medication.  She states she has not required much pain medicine, but the when the pain is significant she takes it.  Incidentally, the Ct also showed an enlarged left ovary.    Current Medication: Outpatient Encounter Medications as of 07/22/2018  Medication Sig  . ALPRAZolam (XANAX) 0.5 MG tablet Take 1 tablet (0.5 mg total) by mouth 2 (two) times daily as needed for anxiety.  . cephALEXin (KEFLEX) 500 MG capsule Take 1 capsule (500 mg total) by mouth 2 (two) times daily.  . Cyanocobalamin (VITAMIN B 12 PO) Take 1 tablet by mouth daily.  Marland Kitchen docusate sodium (COLACE) 100 MG capsule Take 1 tablet once or twice daily as needed for constipation while taking narcotic pain medicine (Patient not taking: Reported on 01/28/2018)  . HYDROcodone-acetaminophen (NORCO/VICODIN) 5-325 MG tablet Take 1 tablet by mouth every 4 (four) hours as needed for moderate pain. (Patient not taking: Reported on 01/28/2018)  . nortriptyline (PAMELOR) 10 MG capsule Start Nortriptyline (Pamelor) 10 mg nightly for one week, then increase to 20 mg nightly for (Patient taking differently: Take 20 mg by mouth at bedtime. )  . ondansetron (ZOFRAN ODT) 4 MG disintegrating tablet Take 1 tablet (4 mg total) by mouth every 8 (eight) hours as needed for nausea or vomiting.  Marland Kitchen oxyCODONE-acetaminophen (PERCOCET) 5-325 MG tablet Take 1 tablet by mouth every 4 (four) hours as needed for severe pain.  . pregabalin (LYRICA) 75  MG capsule Take 1 capsule (75 mg total) by mouth 2 (two) times daily.  . tamsulosin (FLOMAX) 0.4 MG CAPS capsule Take 1 capsule (0.4 mg total) by mouth daily.   No facility-administered encounter medications on file as of 07/22/2018.     Surgical History: Past Surgical History:  Procedure Laterality Date  . BREAST BIOPSY Left 1996   NEG  . CESAREAN SECTION    . TUBAL LIGATION    . UMBILICAL HERNIA REPAIR      Medical History: Past Medical History:  Diagnosis Date  . GERD (gastroesophageal reflux disease)   . Peripheral neuropathy   . TMJ (dislocation of temporomandibular joint)     Family History: Family History  Problem Relation Age of Onset  . Breast cancer Neg Hx     Social History   Socioeconomic History  . Marital status: Married    Spouse name: Not on file  . Number of children: Not on file  . Years of education: Not on file  . Highest education level: Not on file  Occupational History  . Not on file  Social Needs  . Financial resource strain: Not on file  . Food insecurity:    Worry: Not on file    Inability: Not on file  . Transportation needs:    Medical: Not on file    Non-medical: Not on file  Tobacco Use  . Smoking status: Former Smoker    Types: Cigarettes  . Smokeless tobacco: Never Used  Substance  and Sexual Activity  . Alcohol use: No  . Drug use: No  . Sexual activity: Not on file  Lifestyle  . Physical activity:    Days per week: Not on file    Minutes per session: Not on file  . Stress: Not on file  Relationships  . Social connections:    Talks on phone: Not on file    Gets together: Not on file    Attends religious service: Not on file    Active member of club or organization: Not on file    Attends meetings of clubs or organizations: Not on file    Relationship status: Not on file  . Intimate partner violence:    Fear of current or ex partner: Not on file    Emotionally abused: Not on file    Physically abused: Not on file     Forced sexual activity: Not on file  Other Topics Concern  . Not on file  Social History Narrative  . Not on file      Review of Systems  Constitutional: Negative for chills, fatigue and unexpected weight change.  HENT: Negative for congestion, rhinorrhea, sneezing and sore throat.   Eyes: Negative for photophobia, pain and redness.  Respiratory: Negative for cough, chest tightness and shortness of breath.   Cardiovascular: Negative for chest pain and palpitations.  Gastrointestinal: Negative for abdominal pain, constipation, diarrhea, nausea and vomiting.  Endocrine: Negative.   Genitourinary: Negative for dysuria and frequency.  Musculoskeletal: Negative for arthralgias, back pain, joint swelling and neck pain.  Skin: Negative for rash.  Allergic/Immunologic: Negative.   Neurological: Negative for tremors and numbness.  Hematological: Negative for adenopathy. Does not bruise/bleed easily.  Psychiatric/Behavioral: Negative for behavioral problems and sleep disturbance. The patient is not nervous/anxious.     Vital Signs: BP 104/68   Pulse 71   Resp 16   Ht 5\' 5"  (1.651 m)   Wt 135 lb 6.4 oz (61.4 kg)   SpO2 98%   BMI 22.53 kg/m    Physical Exam  Constitutional: She is oriented to person, place, and time. She appears well-developed and well-nourished. No distress.  HENT:  Head: Normocephalic and atraumatic.  Mouth/Throat: Oropharynx is clear and moist. No oropharyngeal exudate.  Eyes: Pupils are equal, round, and reactive to light. EOM are normal.  Neck: Normal range of motion. Neck supple. No JVD present. No tracheal deviation present. No thyromegaly present.  Cardiovascular: Normal rate, regular rhythm and normal heart sounds. Exam reveals no gallop and no friction rub.  No murmur heard. Pulmonary/Chest: Effort normal and breath sounds normal. No respiratory distress. She has no wheezes. She has no rales. She exhibits no tenderness.  Abdominal: Soft. There is no  tenderness. There is no guarding.  Musculoskeletal: Normal range of motion.  Lymphadenopathy:    She has no cervical adenopathy.  Neurological: She is alert and oriented to person, place, and time. No cranial nerve deficit.  Skin: Skin is warm and dry. She is not diaphoretic.  Psychiatric: She has a normal mood and affect. Her behavior is normal. Judgment and thought content normal.  Nursing note and vitals reviewed.   Assessment/Plan: 1. Nephrolithiasis Multiple kidney stones present on ED CT Scan. Most are larger than 50mm. - Ambulatory referral to Urology  2. Enlarged ovary Incidental finding on CT.  Will investigate with Pelvic US, and refer to GYN.  - Ambulatory referral to Gynecology - US PELVIS (TRANSABDOMINAL ONLY); Future  3. Gastroesophageal reflux disease without esophagitis Pt  has stopped RX medication and is taking Super digestive enzymes, she reports relief, and no recent symptoms of GERD.    General Counseling: vee bahe understanding of the findings of todays visit and agrees with plan of treatment. I have discussed any further diagnostic evaluation that may be needed or ordered today. We also reviewed her medications today. she has been encouraged to call the office with any questions or concerns that should arise related to todays visit.   No orders of the defined types were placed in this encounter.     I have reviewed all medical records from hospital follow up including radiology reports and consults from other physicians. Appropriate follow up diagnostics will be scheduled as needed. Patient/ Family understands the plan of treatment. Time spent25 minutes.   Dr Lavera Guise, MD Internal Medicine

## 2018-07-22 NOTE — Patient Instructions (Signed)

## 2018-07-28 ENCOUNTER — Ambulatory Visit
Admission: RE | Admit: 2018-07-28 | Discharge: 2018-07-28 | Disposition: A | Discharge status: Home / Self Care | Payer: 59 | Source: Ambulatory Visit | Attending: Adult Health | Admitting: Adult Health

## 2018-07-28 DIAGNOSIS — N838 Other noninflammatory disorders of ovary, fallopian tube and broad ligament: Secondary | ICD-10-CM | POA: Insufficient documentation

## 2018-07-28 DIAGNOSIS — D259 Leiomyoma of uterus, unspecified: Secondary | ICD-10-CM | POA: Diagnosis not present

## 2018-07-29 ENCOUNTER — Ambulatory Visit (INDEPENDENT_AMBULATORY_CARE_PROVIDER_SITE_OTHER): Payer: 59 | Admitting: Obstetrics and Gynecology

## 2018-07-29 ENCOUNTER — Encounter: Payer: Self-pay | Admitting: Obstetrics and Gynecology

## 2018-07-29 VITALS — BP 92/58 | HR 66 | Ht 65.0 in | Wt 139.0 lb

## 2018-07-29 DIAGNOSIS — D219 Benign neoplasm of connective and other soft tissue, unspecified: Secondary | ICD-10-CM | POA: Diagnosis not present

## 2018-07-29 NOTE — Progress Notes (Addendum)
07/30/2018 1:41 PM   Nicole Rich [birth year] [id number]  Referring provider: Ronnell Freshwater, NP [address] [place], [place] [address]  Chief Complaint  Patient presents with  . Nephrolithiasis    HPI: Patient is a 57 year old Caucasian female who was referred by [facility] ED for nephrolithiasis.    She presented to the ED on 07/12/2018 with the complaint of right flank pain radiating to the right lower quadrant associated with nausea and vomiting.   CT Renal stone study noted 4 mm calculus at the right UPJ with resultant mild to moderate right hydronephrosis.  Additional bilateral nonobstructive renal calculi measuring up to 8 mm on the right and 6 mm on the left.  Asymmetric fullness of the left ovary without discrete lesion.  Consider nonemergent pelvic ultrasound for further evaluation.  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 Unchanged associated mild biliary ductal dilatation in the left hepatic lobe.  Aortic atherosclerosis   Labs in the ED:  Creatinine 0.68.  WBC count 18.4.  UA > 50 RBC's and 11-20 WBC's.     Meds given in the ED: Keflex, Toradol IM, Zofran ODT, Percocet and Flomax.    Prior urological history:  No prior history of stones.     Current NSAID/anticoagulation:   ASA 81 mg.  She hasn't taken the ASA in one week.     Today, she feels full and boated and pressure.  She is having right lower quadrant pain.  She is also having frequency and urgency.  Patient denies any gross hematuria, dysuria or suprapubic/flank pain.  Patient denies any fevers, chills, nausea or vomiting.     PMH: Past Medical History:  Diagnosis Date  . GERD (gastroesophageal reflux disease)   . Peripheral neuropathy   . TMJ (dislocation of temporomandibular joint)     Surgical History: Past Surgical History:  Procedure Laterality Date  . BREAST BIOPSY Left 1996   NEG  . CESAREAN  SECTION    . TUBAL LIGATION    . UMBILICAL HERNIA REPAIR      Home Medications:  Allergies as of 07/30/2018   No Known Allergies     Medication List        Accurate as of 07/30/18  1:41 PM. Always use your most recent med list.          ALPRAZolam 0.5 MG tablet Commonly known as:  XANAX Take 1 tablet (0.5 mg total) by mouth 2 (two) times daily as needed for anxiety.   nortriptyline 10 MG capsule Commonly known as:  PAMELOR Start Nortriptyline (Pamelor) 10 mg nightly for one week, then increase to 20 mg nightly for   ondansetron 4 MG disintegrating tablet Commonly known as:  ZOFRAN ODT Take 1 tablet (4 mg total) by mouth every 8 (eight) hours as needed for nausea or vomiting.   oxyCODONE-acetaminophen 5-325 MG tablet Commonly known as:  PERCOCET Take 1 tablet by mouth every 4 (four) hours as needed for severe pain.   pregabalin 75 MG capsule Commonly known as:  LYRICA Take 1 capsule (75 mg total) by mouth 2 (two) times daily.   tamsulosin 0.4 MG Caps capsule Commonly known as:  FLOMAX Take 1 capsule (0.4 mg total) by mouth daily.   VITAMIN B 12 PO Take 1 tablet by mouth daily.       Allergies: No Known Allergies  Family History: Family History  Problem Relation Age of Onset  .  Cancer Mother   . Diabetes Father   . Hypertension Father   . Cancer Brother   . Breast cancer Neg Hx     Social History:  reports that she has quit smoking. Her smoking use included cigarettes. She has never used smokeless tobacco. She reports that she does not drink alcohol or use drugs.  ROS: UROLOGY Frequent Urination?: Yes Hard to postpone urination?: Yes Burning/pain with urination?: No Get up at night to urinate?: No Leakage of urine?: No Urine stream starts and stops?: No Trouble starting stream?: No Do you have to strain to urinate?: No Blood in urine?: No Urinary tract infection?: No Sexually transmitted disease?: No Injury to kidneys or bladder?: No Painful  intercourse?: No Weak stream?: No Currently pregnant?: No Vaginal bleeding?: No Last menstrual period?: n  Gastrointestinal Nausea?: No Vomiting?: No Indigestion/heartburn?: No Diarrhea?: No Constipation?: No  Constitutional Fever: No Night sweats?: Yes Weight loss?: No Fatigue?: Yes  Skin Skin rash/lesions?: No Itching?: No  Eyes Blurred vision?: Yes Double vision?: No  Ears/Nose/Throat Sore throat?: No Sinus problems?: No  Hematologic/Lymphatic Swollen glands?: No Easy bruising?: No  Cardiovascular Leg swelling?: No Chest pain?: No  Respiratory Cough?: No Shortness of breath?: No  Endocrine Excessive thirst?: No  Musculoskeletal Back pain?: No Joint pain?: Yes  Neurological Headaches?: No Dizziness?: Yes  Psychologic Depression?: No Anxiety?: No  Physical Exam: BP 106/66 (BP Location: Left Arm, Patient Position: Sitting, Cuff Size: Normal)   Pulse 71   Ht '5\' 5"'  (1.651 m)   Wt 141 lb 12.8 oz (64.3 kg)   BMI 23.60 kg/m   Constitutional:  Well nourished. Alert and oriented, No acute distress. HEENT:  AT, moist mucus membranes.  Trachea midline, no masses. Cardiovascular: No clubbing, cyanosis, or edema. Respiratory: Normal respiratory effort, no increased work of breathing. GI: Abdomen is soft, non tender, non distended, no abdominal masses. Liver and spleen not palpable.  No hernias appreciated.  Stool sample for occult testing is not indicated.   GU: No CVA tenderness.  No bladder fullness or masses.   Skin: No rashes, bruises or suspicious lesions. Lymph: No cervical or inguinal adenopathy. Neurologic: Grossly intact, no focal deficits, moving all 4 extremities. Psychiatric: Normal mood and affect.  Laboratory Data: Lab Results  Component Value Date   WBC 18.4 (H) 07/12/2018   HGB 16.6 (H) 07/12/2018   HCT 49.2 (H) 07/12/2018   MCV 89.9 07/12/2018   PLT 295 07/12/2018    Lab Results  Component Value Date   CREATININE 0.68  07/12/2018    No results found for: PSA  No results found for: TESTOSTERONE  No results found for: HGBA1C  Lab Results  Component Value Date   TSH 1.760 01/11/2016    No results found for: CHOL, HDL, CHOLHDL, VLDL, LDLCALC  Lab Results  Component Value Date   AST 26 07/12/2018   Lab Results  Component Value Date   ALT 15 07/12/2018   No components found for: ALKALINEPHOPHATASE No components found for: BILIRUBINTOTAL  No results found for: ESTRADIOL  Urinalysis Negative.  See Epic.  See HPI.    I have reviewed the labs.   Pertinent Imaging: CLINICAL DATA:  Right-sided abdominal and flank pain for the past 2 days.  EXAM: CT ABDOMEN AND PELVIS WITHOUT CONTRAST  TECHNIQUE: Multidetector CT imaging of the abdomen and pelvis was performed following the standard protocol without IV contrast.  COMPARISON:  CT abdomen dated December 18, 2013.  FINDINGS: Lower chest: No acute abnormality.  Hepatobiliary: Interval increase in size of multiple simple cysts in the liver. 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 Unchanged biliary ductal dilatation in the left hepatic lobe. The gallbladder is unremarkable.  Pancreas: Unremarkable. No pancreatic ductal dilatation or surrounding inflammatory changes.  Spleen: Normal in size without focal abnormality.  Adrenals/Urinary Tract: The adrenal glands are unremarkable. There is a 4 mm calculus at the right UPJ with resultant mild to moderate right hydronephrosis. 8 mm calculus in the lower pole of the right kidney. Multiple nonobstructive left renal calculi measuring up to 6 mm. No left hydronephrosis. The bladder is decompressed.  Stomach/Bowel: Small hiatal hernia, unchanged. The stomach is otherwise within normal limits. No bowel wall thickening, distention, or surrounding inflammatory changes. Normal  appendix.  Vascular/Lymphatic: Aortic atherosclerosis. No enlarged abdominal or pelvic lymph nodes.  Reproductive: Fibroid uterus. Asymmetric fullness of the left ovary.  Other: No free fluid or pneumoperitoneum.  Musculoskeletal: No acute or significant osseous findings.  IMPRESSION: 1. 4 mm calculus at the right UPJ with resultant mild to moderate right hydronephrosis. 2. Additional bilateral nonobstructive renal calculi measuring up to 8 mm on the right and 6 mm on the left. 3. Asymmetric fullness of the left ovary without discrete lesion. Consider nonemergent pelvic ultrasound for further evaluation. 4. 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 Unchanged associated mild biliary ductal dilatation in the left hepatic lobe. 5.  Aortic atherosclerosis (ICD10-I70.0).   Electronically Signed   By: Titus Dubin M.D.   On: 07/12/2018 14:35  I have independently reviewed the films.    Assessment & Plan:    Patient with a right ureteral stone and a right renal stone who will undergo right URS/LL/ureteral stent placement.    1. Right ureteral stone Explained to the patient that AUA Guidelines for patients with uncomplicated ureteral stones ?10 mm should be offered observation, and those with distal stones of similar size should be offered MET with ?-blockers URS would be the first lined therapy if stone(s) do not pass, but ESWL is the procedure with the least morbidity and lowest complication rate, but URS has a greater stone-free rate in a single procedure Schedule right ureteroscopy with laser lithotripsy and ureteral stent placement Explained to the patient how the procedure is performed and the risks involved Informed patient that they will have a stent placed during the procedure and will remain in place after the procedure for a short time.  Stent may be removed in the  office with a cystoscope or patient may be instructed to remove the stent themselves by the string Described "stent pain" as feelings of needing to urinate/overactive bladder and a warm, tingling sensation to intense pain in the affected flank Residual stones within the kidney or ureter may be present after the procedure and may need to have these addressed at a different encounter Injury to the ureter is the most common intra-operative risk, it may result in an open procedure to correct the defect Infection and bleeding are also risks Explained the risks of general anesthesia, such as: MI, CVA, paralysis, coma and/or death. Advised to contact our office or seek treatment in the ED if becomes febrile or pain/ vomiting are difficult control in order to arrange for emergent/urgent intervention She last received Percocet 5/325 on 07/12/2018.  Advised patient that Percocet is a narcotic and can be habit  forming.  She should not drive, operate heavy machinery or make important decisions while on this medication.  It can also be a respiratory depressant so she is advised not to take more than prescribed.     2. Right hydronephrosis Obtain RUS to ensure the hydronephrosis has resolved once they have passed and/or recovered from procedure to ensure to iatrogenic hydronephrosis remains - it is explained to the patient that it is important to document resolution of the hydronephrosis as "silent hydronephrosis" can occur and cause damage and/or loss of the kidney  3. Microscopic hematuria UA today demonstrates no hematuria Continue to monitor the patient's UA after the treatment/passage of the stone to ensure the hematuria has resolved I hematuria persists, we will pursue a hematuria workup with CT Urogram and cystoscopy if appropriate  4. Bilateral nephrolithiasis Will offer 24 hour metabolic work up once recovered from right UPJ stone If patient pursues URS she would like the right renal stone addressed at  the time of URS.   I explain that it is our goal to get a patient as stone free as possible after a procedure, but it is always possible that multiple stones may need to be addressed in a staged procedure   5. Left ovarian cyst Has been evaluated by gynecology    Return for right URS/LL/ureteral stent .  These notes generated with voice recognition software. I apologize for typographical errors.  Zara Council, PA-C  [facility] [address]  [place], [place] [address] [phone]

## 2018-07-29 NOTE — Progress Notes (Signed)
HPI:      [name] is a 57 y.o. G1P0 who LMP was No LMP recorded. Patient is postmenopausal.  Subjective:   She presents today as a follow-up from emergency department visit.  She had a CT scan for urethral calculi and it seemed as if she had an enlarged ovary.  She subsequently underwent a follow-up ultrasound which revealed uterine fibroids. She presents today for follow-up.  She denies GYN complaints. She is still dealing with her "kidney stones".  She has an appointment with urology tomorrow. During performance she describes heavy menstrual bleeding with significant cramping-likely consistent with uterine fibroids. She says that she is up-to-date with annual examinations and mammogram but "is due about now".    Hx: The following portions of the patient's history were reviewed and updated as appropriate:             She  has a past medical history of GERD (gastroesophageal reflux disease), Peripheral neuropathy, and TMJ (dislocation of temporomandibular joint). She does not have any pertinent problems on file. She  has a past surgical history that includes Tubal ligation; Umbilical hernia repair; Cesarean section; and Breast biopsy (Left, 1996). Her family history includes Cancer in her brother and mother; Diabetes in her father; Hypertension in her father. She  reports that she has quit smoking. Her smoking use included cigarettes. She has never used smokeless tobacco. She reports that she does not drink alcohol or use drugs. She has a current medication list which includes the following prescription(s): alprazolam, cyanocobalamin, nortriptyline, ondansetron, oxycodone-acetaminophen, pregabalin, tamsulosin, cephalexin, docusate sodium, and hydrocodone-acetaminophen. She has No Known Allergies.       Review of Systems:  Review of Systems  Constitutional: Denied constitutional symptoms, night sweats, recent illness, fatigue, fever, insomnia and weight loss.  Eyes: Denied eye  symptoms, eye pain, photophobia, vision change and visual disturbance.  Ears/Nose/Throat/Neck: Denied ear, nose, throat or neck symptoms, hearing loss, nasal discharge, sinus congestion and sore throat.  Cardiovascular: Denied cardiovascular symptoms, arrhythmia, chest pain/pressure, edema, exercise intolerance, orthopnea and palpitations.  Respiratory: Denied pulmonary symptoms, asthma, pleuritic pain, productive sputum, cough, dyspnea and wheezing.  Gastrointestinal: Denied, gastro-esophageal reflux, melena, nausea and vomiting.  Genitourinary: Denied genitourinary symptoms including symptomatic vaginal discharge, pelvic relaxation issues, and urinary complaints.  Musculoskeletal: Denied musculoskeletal symptoms, stiffness, swelling, muscle weakness and myalgia.  Dermatologic: Denied dermatology symptoms, rash and scar.  Neurologic: Denied neurology symptoms, dizziness, headache, neck pain and syncope.  Psychiatric: Denied psychiatric symptoms, anxiety and depression.  Endocrine: Denied endocrine symptoms including hot flashes and night sweats.   Meds:   Current Outpatient Medications on File Prior to Visit  Medication Sig Dispense Refill  . ALPRAZolam (XANAX) 0.5 MG tablet Take 1 tablet (0.5 mg total) by mouth 2 (two) times daily as needed for anxiety. 60 tablet 3  . Cyanocobalamin (VITAMIN B 12 PO) Take 1 tablet by mouth daily.    . nortriptyline (PAMELOR) 10 MG capsule Start Nortriptyline (Pamelor) 10 mg nightly for one week, then increase to 20 mg nightly for (Patient taking differently: Take 20 mg by mouth at bedtime. ) 30 capsule 3  . ondansetron (ZOFRAN ODT) 4 MG disintegrating tablet Take 1 tablet (4 mg total) by mouth every 8 (eight) hours as needed for nausea or vomiting. 20 tablet 0  . oxyCODONE-acetaminophen (PERCOCET) 5-325 MG tablet Take 1 tablet by mouth every 4 (four) hours as needed for severe pain. 20 tablet 0  . pregabalin (LYRICA) 75 MG capsule Take 1 capsule (  75 mg total)  by mouth 2 (two) times daily. 60 capsule 2  . tamsulosin (FLOMAX) 0.4 MG CAPS capsule Take 1 capsule (0.4 mg total) by mouth daily. 30 capsule 0  . cephALEXin (KEFLEX) 500 MG capsule Take 1 capsule (500 mg total) by mouth 2 (two) times daily. (Patient not taking: Reported on 07/29/2018) 14 capsule 0  . docusate sodium (COLACE) 100 MG capsule Take 1 tablet once or twice daily as needed for constipation while taking narcotic pain medicine (Patient not taking: Reported on 01/28/2018) 30 capsule 0  . HYDROcodone-acetaminophen (NORCO/VICODIN) 5-325 MG tablet Take 1 tablet by mouth every 4 (four) hours as needed for moderate pain. (Patient not taking: Reported on 01/28/2018) 20 tablet 0   No current facility-administered medications on file prior to visit.     Objective:     Vitals:   07/29/18 0812  BP: (!) 92/58  Pulse: 66              Ultrasound results and CT scan reviewed in detail with the patient.  Assessment:    G1P0 Patient Active Problem List   Diagnosis Date Noted  . Gastroesophageal reflux disease 01/28/2018  . Anxiety disorder 01/28/2018  . Neuropathy 12/12/2017  . Weakness 12/12/2017  . Numbness and tingling 01/04/2016  . Pain of upper extremity 01/04/2016     1. Fibroids     Small asymptomatic fibroids ---   no evidence of ovarian enlargement    Plan:            1.  Fibroids Uterine fibroids were discussed in detail.  The natural course and history of fibroids were reviewed.  Multiple treatment options were also discussed including NSAIDS, hormonal options and hysterectomy.  Menopause and its effect on fibroids was also reviewed. Patient to keep her urology appointment and follow-up for kidney stones. Orders No orders of the defined types were placed in this encounter.   No orders of the defined types were placed in this encounter.     F/U  Return for Annual Physical. I spent 31 minutes involved in the care of this patient of which greater than 50% was spent  discussing and reviewing her ultrasound and CT results and explaining the results to her.  Rationale for no further work-up of ovaries or fibroids discussed in detail.  Necessity of annual exam follow-up including Pap smear and mammograms discussed.  Personal history of breast biopsy, family history of neurologic issues discussed.    Finis Bud, M.D. 07/29/2018 8:48 AM

## 2018-07-29 NOTE — H&P (View-Only) (Signed)
07/30/2018 1:41 PM   Nicole Rich December 01, 1961 [id number]  Referring provider: Ronnell Freshwater, NP [address] [place], [place] [address]  Chief Complaint  Patient presents with  . Nephrolithiasis    HPI: Patient is a 57 year old Caucasian female who was referred by [facility] ED for nephrolithiasis.    She presented to the ED on 07/12/2018 with the complaint of right flank pain radiating to the right lower quadrant associated with nausea and vomiting.   CT Renal stone study noted 4 mm calculus at the right UPJ with resultant mild to moderate right hydronephrosis.  Additional bilateral nonobstructive renal calculi measuring up to 8 mm on the right and 6 mm on the left.  Asymmetric fullness of the left ovary without discrete lesion.  Consider nonemergent pelvic ultrasound for further evaluation.  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 Unchanged associated mild biliary ductal dilatation in the left hepatic lobe.  Aortic atherosclerosis   Labs in the ED:  Creatinine 0.68.  WBC count 18.4.  UA > 50 RBC's and 11-20 WBC's.     Meds given in the ED: Keflex, Toradol IM, Zofran ODT, Percocet and Flomax.    Prior urological history:  No prior history of stones.     Current NSAID/anticoagulation:   ASA 81 mg.  She hasn't taken the ASA in one week.     Today, she feels full and boated and pressure.  She is having right lower quadrant pain.  She is also having frequency and urgency.  Patient denies any gross hematuria, dysuria or suprapubic/flank pain.  Patient denies any fevers, chills, nausea or vomiting.     PMH: Past Medical History:  Diagnosis Date  . GERD (gastroesophageal reflux disease)   . Peripheral neuropathy   . TMJ (dislocation of temporomandibular joint)     Surgical History: Past Surgical History:  Procedure Laterality Date  . BREAST BIOPSY Left 1996   NEG  . CESAREAN  SECTION    . TUBAL LIGATION    . UMBILICAL HERNIA REPAIR      Home Medications:  Allergies as of 07/30/2018   No Known Allergies     Medication List        Accurate as of 07/30/18  1:41 PM. Always use your most recent med list.          ALPRAZolam 0.5 MG tablet Commonly known as:  XANAX Take 1 tablet (0.5 mg total) by mouth 2 (two) times daily as needed for anxiety.   nortriptyline 10 MG capsule Commonly known as:  PAMELOR Start Nortriptyline (Pamelor) 10 mg nightly for one week, then increase to 20 mg nightly for   ondansetron 4 MG disintegrating tablet Commonly known as:  ZOFRAN ODT Take 1 tablet (4 mg total) by mouth every 8 (eight) hours as needed for nausea or vomiting.   oxyCODONE-acetaminophen 5-325 MG tablet Commonly known as:  PERCOCET Take 1 tablet by mouth every 4 (four) hours as needed for severe pain.   pregabalin 75 MG capsule Commonly known as:  LYRICA Take 1 capsule (75 mg total) by mouth 2 (two) times daily.   tamsulosin 0.4 MG Caps capsule Commonly known as:  FLOMAX Take 1 capsule (0.4 mg total) by mouth daily.   VITAMIN B 12 PO Take 1 tablet by mouth daily.       Allergies: No Known Allergies  Family History: Family History  Problem Relation Age of Onset  .  Cancer Mother   . Diabetes Father   . Hypertension Father   . Cancer Brother   . Breast cancer Neg Hx     Social History:  reports that she has quit smoking. Her smoking use included cigarettes. She has never used smokeless tobacco. She reports that she does not drink alcohol or use drugs.  ROS: UROLOGY Frequent Urination?: Yes Hard to postpone urination?: Yes Burning/pain with urination?: No Get up at night to urinate?: No Leakage of urine?: No Urine stream starts and stops?: No Trouble starting stream?: No Do you have to strain to urinate?: No Blood in urine?: No Urinary tract infection?: No Sexually transmitted disease?: No Injury to kidneys or bladder?: No Painful  intercourse?: No Weak stream?: No Currently pregnant?: No Vaginal bleeding?: No Last menstrual period?: n  Gastrointestinal Nausea?: No Vomiting?: No Indigestion/heartburn?: No Diarrhea?: No Constipation?: No  Constitutional Fever: No Night sweats?: Yes Weight loss?: No Fatigue?: Yes  Skin Skin rash/lesions?: No Itching?: No  Eyes Blurred vision?: Yes Double vision?: No  Ears/Nose/Throat Sore throat?: No Sinus problems?: No  Hematologic/Lymphatic Swollen glands?: No Easy bruising?: No  Cardiovascular Leg swelling?: No Chest pain?: No  Respiratory Cough?: No Shortness of breath?: No  Endocrine Excessive thirst?: No  Musculoskeletal Back pain?: No Joint pain?: Yes  Neurological Headaches?: No Dizziness?: Yes  Psychologic Depression?: No Anxiety?: No  Physical Exam: BP 106/66 (BP Location: Left Arm, Patient Position: Sitting, Cuff Size: Normal)   Pulse 71   Ht '5\' 5"'  (2.751 m)   Wt 141 lb 12.8 oz (64.3 kg)   BMI 23.60 kg/m   Constitutional:  Well nourished. Alert and oriented, No acute distress. HEENT: [place] AT, moist mucus membranes.  Trachea midline, no masses. Cardiovascular: No clubbing, cyanosis, or edema. Respiratory: Normal respiratory effort, no increased work of breathing. GI: Abdomen is soft, non tender, non distended, no abdominal masses. Liver and spleen not palpable.  No hernias appreciated.  Stool sample for occult testing is not indicated.   GU: No CVA tenderness.  No bladder fullness or masses.   Skin: No rashes, bruises or suspicious lesions. Lymph: No cervical or inguinal adenopathy. Neurologic: Grossly intact, no focal deficits, moving all 4 extremities. Psychiatric: Normal mood and affect.  Laboratory Data: Lab Results  Component Value Date   WBC 18.4 (H) 07/12/2018   HGB 16.6 (H) 07/12/2018   HCT 49.2 (H) 07/12/2018   MCV 89.9 07/12/2018   PLT 295 07/12/2018    Lab Results  Component Value Date   CREATININE 0.68  07/12/2018    No results found for: PSA  No results found for: TESTOSTERONE  No results found for: HGBA1C  Lab Results  Component Value Date   TSH 1.760 01/11/2016    No results found for: CHOL, HDL, CHOLHDL, VLDL, LDLCALC  Lab Results  Component Value Date   AST 26 07/12/2018   Lab Results  Component Value Date   ALT 15 07/12/2018   No components found for: ALKALINEPHOPHATASE No components found for: BILIRUBINTOTAL  No results found for: ESTRADIOL  Urinalysis Negative.  See Epic.  See HPI.    I have reviewed the labs.   Pertinent Imaging: CLINICAL DATA:  Right-sided abdominal and flank pain for the past 2 days.  EXAM: CT ABDOMEN AND PELVIS WITHOUT CONTRAST  TECHNIQUE: Multidetector CT imaging of the abdomen and pelvis was performed following the standard protocol without IV contrast.  COMPARISON:  CT abdomen dated December 18, 2013.  FINDINGS: Lower chest: No acute abnormality.  Hepatobiliary: Interval increase in size of multiple simple cysts in the liver. 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 Unchanged biliary ductal dilatation in the left hepatic lobe. The gallbladder is unremarkable.  Pancreas: Unremarkable. No pancreatic ductal dilatation or surrounding inflammatory changes.  Spleen: Normal in size without focal abnormality.  Adrenals/Urinary Tract: The adrenal glands are unremarkable. There is a 4 mm calculus at the right UPJ with resultant mild to moderate right hydronephrosis. 8 mm calculus in the lower pole of the right kidney. Multiple nonobstructive left renal calculi measuring up to 6 mm. No left hydronephrosis. The bladder is decompressed.  Stomach/Bowel: Small hiatal hernia, unchanged. The stomach is otherwise within normal limits. No bowel wall thickening, distention, or surrounding inflammatory changes. Normal  appendix.  Vascular/Lymphatic: Aortic atherosclerosis. No enlarged abdominal or pelvic lymph nodes.  Reproductive: Fibroid uterus. Asymmetric fullness of the left ovary.  Other: No free fluid or pneumoperitoneum.  Musculoskeletal: No acute or significant osseous findings.  IMPRESSION: 1. 4 mm calculus at the right UPJ with resultant mild to moderate right hydronephrosis. 2. Additional bilateral nonobstructive renal calculi measuring up to 8 mm on the right and 6 mm on the left. 3. Asymmetric fullness of the left ovary without discrete lesion. Consider nonemergent pelvic ultrasound for further evaluation. 4. 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 Unchanged associated mild biliary ductal dilatation in the left hepatic lobe. 5.  Aortic atherosclerosis (ICD10-I70.0).   Electronically Signed   By: Titus Dubin M.D.   On: 07/12/2018 14:35  I have independently reviewed the films.    Assessment & Plan:    Patient with a right ureteral stone and a right renal stone who will undergo right URS/LL/ureteral stent placement.    1. Right ureteral stone Explained to the patient that AUA Guidelines for patients with uncomplicated ureteral stones ?10 mm should be offered observation, and those with distal stones of similar size should be offered MET with ?-blockers URS would be the first lined therapy if stone(s) do not pass, but ESWL is the procedure with the least morbidity and lowest complication rate, but URS has a greater stone-free rate in a single procedure Schedule right ureteroscopy with laser lithotripsy and ureteral stent placement Explained to the patient how the procedure is performed and the risks involved Informed patient that they will have a stent placed during the procedure and will remain in place after the procedure for a short time.  Stent may be removed in the  office with a cystoscope or patient may be instructed to remove the stent themselves by the string Described "stent pain" as feelings of needing to urinate/overactive bladder and a warm, tingling sensation to intense pain in the affected flank Residual stones within the kidney or ureter may be present after the procedure and may need to have these addressed at a different encounter Injury to the ureter is the most common intra-operative risk, it may result in an open procedure to correct the defect Infection and bleeding are also risks Explained the risks of general anesthesia, such as: MI, CVA, paralysis, coma and/or death. Advised to contact our office or seek treatment in the ED if becomes febrile or pain/ vomiting are difficult control in order to arrange for emergent/urgent intervention She last received Percocet 5/325 on 07/12/2018.  Advised patient that Percocet is a narcotic and can be habit  forming.  She should not drive, operate heavy machinery or make important decisions while on this medication.  It can also be a respiratory depressant so she is advised not to take more than prescribed.     2. Right hydronephrosis Obtain RUS to ensure the hydronephrosis has resolved once they have passed and/or recovered from procedure to ensure to iatrogenic hydronephrosis remains - it is explained to the patient that it is important to document resolution of the hydronephrosis as "silent hydronephrosis" can occur and cause damage and/or loss of the kidney  3. Microscopic hematuria UA today demonstrates no hematuria Continue to monitor the patient's UA after the treatment/passage of the stone to ensure the hematuria has resolved I hematuria persists, we will pursue a hematuria workup with CT Urogram and cystoscopy if appropriate  4. Bilateral nephrolithiasis Will offer 24 hour metabolic work up once recovered from right UPJ stone If patient pursues URS she would like the right renal stone addressed at  the time of URS.   I explain that it is our goal to get a patient as stone free as possible after a procedure, but it is always possible that multiple stones may need to be addressed in a staged procedure   5. Left ovarian cyst Has been evaluated by gynecology    Return for right URS/LL/ureteral stent .  These notes generated with voice recognition software. I apologize for typographical errors.  Zara Council, PA-C  [facility] [address]  [place], [place] [address] [phone]

## 2018-07-29 NOTE — Progress Notes (Signed)
Pt states enlarged ovary was seen during CT scan for kidney stones on 7-23. Pt no unusual pain.

## 2018-07-30 ENCOUNTER — Ambulatory Visit
Admission: RE | Admit: 2018-07-30 | Discharge: 2018-07-30 | Disposition: A | Discharge status: Home / Self Care | Payer: 59 | Source: Ambulatory Visit | Attending: Urology | Admitting: Urology

## 2018-07-30 ENCOUNTER — Other Ambulatory Visit: Payer: Self-pay | Admitting: Radiology

## 2018-07-30 ENCOUNTER — Ambulatory Visit (INDEPENDENT_AMBULATORY_CARE_PROVIDER_SITE_OTHER): Payer: 59 | Admitting: Urology

## 2018-07-30 ENCOUNTER — Encounter: Payer: Self-pay | Admitting: Urology

## 2018-07-30 VITALS — BP 106/66 | HR 71 | Ht 65.0 in | Wt 141.8 lb

## 2018-07-30 DIAGNOSIS — N132 Hydronephrosis with renal and ureteral calculous obstruction: Secondary | ICD-10-CM | POA: Diagnosis not present

## 2018-07-30 DIAGNOSIS — R3129 Other microscopic hematuria: Secondary | ICD-10-CM

## 2018-07-30 DIAGNOSIS — N201 Calculus of ureter: Secondary | ICD-10-CM

## 2018-07-30 DIAGNOSIS — N200 Calculus of kidney: Secondary | ICD-10-CM | POA: Insufficient documentation

## 2018-07-30 DIAGNOSIS — R109 Unspecified abdominal pain: Secondary | ICD-10-CM | POA: Diagnosis present

## 2018-07-30 LAB — URINALYSIS, COMPLETE
Bilirubin, UA: NEGATIVE
Glucose, UA: NEGATIVE
KETONES UA: NEGATIVE
Leukocytes, UA: NEGATIVE
NITRITE UA: NEGATIVE
PH UA: 5.5 (ref 5.0–7.5)
Protein, UA: NEGATIVE
RBC UA: NEGATIVE
UUROB: 0.2 mg/dL (ref 0.2–1.0)

## 2018-07-30 MED ORDER — OXYCODONE-ACETAMINOPHEN 5-325 MG PO TABS: 1.0000 | ORAL_TABLET | ORAL | 0 refills | Status: DC | PRN

## 2018-08-01 ENCOUNTER — Telehealth: Payer: Self-pay

## 2018-08-01 NOTE — Telephone Encounter (Signed)
-----   Message from Nori Riis, PA-C sent at 07/31/2018  8:31 AM EDT ----- Please let Mrs. Boesel know that the right stone that was in her ureter is not visualized on the x-ray.   She can still proceed with the surgery as she is symptomatic and not all stones are visible on x-ray.  If the ureteral stone has passed, the right kidney stone can be treated.

## 2018-08-01 NOTE — Telephone Encounter (Signed)
Pt informed, wants to proceed as planned.

## 2018-08-02 LAB — CULTURE, URINE COMPREHENSIVE

## 2018-08-06 ENCOUNTER — Other Ambulatory Visit: Payer: Self-pay

## 2018-08-06 ENCOUNTER — Encounter
Admission: RE | Admit: 2018-08-06 | Discharge: 2018-08-06 | Disposition: A | Discharge status: Home / Self Care | Payer: 59 | Source: Ambulatory Visit | Attending: Urology | Admitting: Urology

## 2018-08-06 HISTORY — DX: Unspecified osteoarthritis, unspecified site: M19.90

## 2018-08-06 HISTORY — DX: Foot drop, right foot: M21.371

## 2018-08-06 HISTORY — DX: Anxiety disorder, unspecified: F41.9

## 2018-08-06 HISTORY — DX: Personal history of urinary calculi: Z87.442

## 2018-08-06 HISTORY — DX: Personal history of other diseases of the digestive system: Z87.19

## 2018-08-06 HISTORY — DX: Family history of other specified conditions: Z84.89

## 2018-08-06 NOTE — Patient Instructions (Signed)
Your procedure is scheduled on: 08-13-18  Report to Same Day Surgery 2nd floor medical mall [facility] Entrance-take elevator on left to 2nd floor.  Check in with surgery information desk.) To find out your arrival time please call [phone] between 1PM - 3PM on 08-12-18  Remember: Instructions that are not followed completely may result in serious medical risk, up to and including death, or upon the discretion of your surgeon and anesthesiologist your surgery may need to be rescheduled.    _x___ 1. Do not eat food after midnight the night before your procedure. NO GUM OR CANDY AFTER MIDNIGHT. You may drink clear liquids up to 2 hours before you are scheduled to arrive at the hospital for your procedure.  Do not drink clear liquids within 2 hours of your scheduled arrival to the hospital.  Clear liquids include  --Water or Apple juice without pulp  --Clear carbohydrate beverage such as ClearFast or Gatorade  --Black Coffee or Clear Tea (No milk, no creamers, do not add anything to the coffee or Tea   ____Ensure clear carbohydrate drink on the way to the hospital for bariatric patients  ____Ensure clear carbohydrate drink 3 hours before surgery for Dr Dwyane Luo patients if physician instructed.     __x__ 2. No Alcohol for 24 hours before or after surgery.   __x__3. No Smoking or e-cigarettes for 24 prior to surgery.  Do not use any chewable tobacco products for at least 6 hour prior to surgery   ____  4. Bring all medications with you on the day of surgery if instructed.    __x__ 5. Notify your doctor if there is any change in your medical condition     (cold, fever, infections).    x___6. On the morning of surgery brush your teeth with toothpaste and water.  You may rinse your mouth with mouth wash if you wish.  Do not swallow any toothpaste or mouthwash.   Do not wear jewelry, make-up, hairpins, clips or nail polish.  Do not wear lotions, powders, or perfumes. You may wear  deodorant.  Do not shave 48 hours prior to surgery. Men may shave face and neck.  Do not bring valuables to the hospital.    [facility] is not responsible for any belongings or valuables.               Contacts, dentures or bridgework may not be worn into surgery.  Leave your suitcase in the car. After surgery it may be brought to your room.  For patients admitted to the hospital, discharge time is determined by your treatment team.  _  Patients discharged the day of surgery will not be allowed to drive home.  You will need someone to drive you home and stay with you the night of your procedure.    Please read over the following fact sheets that you were given:   [facility] Preparing for Surgery   _x___ TAKE THE FOLLOWING MEDICATION THE MORNING OF SURGERY WITH A SMALL SIP OF WATER. These include:  1. LYRICA  2. FLOMAX  3. YOU MAY TAKE YOUR XANAX AM OF SURGERY IF NEEDED  4.  5.  6.  ____Fleets enema or Magnesium Citrate as directed.   ____ Use CHG Soap or sage wipes as directed on instruction sheet   ____ Use inhalers on the day of surgery and bring to hospital day of surgery  ____ Stop Metformin and Janumet 2 days prior to surgery.    ____  Take 1/2 of usual insulin dose the night before surgery and none on the morning surgery.   ____ Follow recommendations from Cardiologist, Pulmonologist or PCP regarding stopping Aspirin, Coumadin, Plavix ,Eliquis, Effient, or Pradaxa, and Pletal.  X____Stop Anti-inflammatories such as Advil, Aleve, Ibuprofen, Motrin, Naproxen, Naprosyn, Goodies powders or aspirin products. OK to take Tylenol OR PERCOCET IF NEEDED   _x___ Stop supplements until after surgery-STOP FISH OIL, MENOPAUSE RELIEF AND SUPER DIGESTIVE ENZYMES NOW-MAY RESUME AFTER SURGERY   ____ Bring C-Pap to the hospital.

## 2018-08-12 MED ORDER — CEFAZOLIN SODIUM-DEXTROSE 2-4 GM/100ML-% IV SOLN
2.0000 g | INTRAVENOUS | Status: AC
  Administered 2018-08-13: 2 g via INTRAVENOUS

## 2018-08-13 ENCOUNTER — Ambulatory Visit
Admission: RE | Admit: 2018-08-13 | Discharge: 2018-08-13 | Disposition: A | Discharge status: Home / Self Care | Payer: 59 | Source: Ambulatory Visit | Attending: Urology | Admitting: Urology

## 2018-08-13 ENCOUNTER — Ambulatory Visit: Payer: 59 | Admitting: Anesthesiology

## 2018-08-13 ENCOUNTER — Encounter
Admission: RE | Disposition: A | Discharge status: Home / Self Care | Payer: Self-pay | Source: Ambulatory Visit | Attending: Urology

## 2018-08-13 DIAGNOSIS — Z87891 Personal history of nicotine dependence: Secondary | ICD-10-CM | POA: Diagnosis not present

## 2018-08-13 DIAGNOSIS — N201 Calculus of ureter: Secondary | ICD-10-CM

## 2018-08-13 DIAGNOSIS — G629 Polyneuropathy, unspecified: Secondary | ICD-10-CM | POA: Insufficient documentation

## 2018-08-13 DIAGNOSIS — N132 Hydronephrosis with renal and ureteral calculous obstruction: Secondary | ICD-10-CM | POA: Insufficient documentation

## 2018-08-13 DIAGNOSIS — F419 Anxiety disorder, unspecified: Secondary | ICD-10-CM | POA: Diagnosis not present

## 2018-08-13 DIAGNOSIS — Z79899 Other long term (current) drug therapy: Secondary | ICD-10-CM | POA: Insufficient documentation

## 2018-08-13 DIAGNOSIS — K219 Gastro-esophageal reflux disease without esophagitis: Secondary | ICD-10-CM | POA: Diagnosis not present

## 2018-08-13 DIAGNOSIS — Z87442 Personal history of urinary calculi: Secondary | ICD-10-CM | POA: Diagnosis not present

## 2018-08-13 DIAGNOSIS — M479 Spondylosis, unspecified: Secondary | ICD-10-CM | POA: Diagnosis not present

## 2018-08-13 DIAGNOSIS — N83202 Unspecified ovarian cyst, left side: Secondary | ICD-10-CM | POA: Insufficient documentation

## 2018-08-13 DIAGNOSIS — N202 Calculus of kidney with calculus of ureter: Secondary | ICD-10-CM

## 2018-08-13 HISTORY — PX: CYSTOSCOPY/URETEROSCOPY/HOLMIUM LASER/STENT PLACEMENT: SHX6546

## 2018-08-13 SURGERY — CYSTOSCOPY/URETEROSCOPY/HOLMIUM LASER/STENT PLACEMENT
Anesthesia: General | Site: Ureter | Laterality: Right | Wound class: Clean Contaminated

## 2018-08-13 MED ORDER — ROCURONIUM BROMIDE 50 MG/5ML IV SOLN
INTRAVENOUS | Status: AC
  Filled 2018-08-13: qty 1

## 2018-08-13 MED ORDER — PROPOFOL 10 MG/ML IV BOLUS
INTRAVENOUS | Status: DC | PRN
  Administered 2018-08-13: 100 mg via INTRAVENOUS
  Administered 2018-08-13: 50 mg via INTRAVENOUS

## 2018-08-13 MED ORDER — MIDAZOLAM HCL 2 MG/2ML IJ SOLN
INTRAMUSCULAR | Status: DC | PRN
  Administered 2018-08-13: 2 mg via INTRAVENOUS

## 2018-08-13 MED ORDER — KETOROLAC TROMETHAMINE 30 MG/ML IJ SOLN
INTRAMUSCULAR | Status: DC | PRN
  Administered 2018-08-13: 30 mg via INTRAVENOUS

## 2018-08-13 MED ORDER — OXYCODONE-ACETAMINOPHEN 5-325 MG PO TABS: 1.0000 | ORAL_TABLET | ORAL | 0 refills | Status: DC | PRN

## 2018-08-13 MED ORDER — FENTANYL CITRATE (PF) 100 MCG/2ML IJ SOLN
INTRAMUSCULAR | Status: AC
  Administered 2018-08-13: 25 ug via INTRAVENOUS
  Filled 2018-08-13: qty 2

## 2018-08-13 MED ORDER — KETOROLAC TROMETHAMINE 30 MG/ML IJ SOLN
INTRAMUSCULAR | Status: AC
  Filled 2018-08-13: qty 1

## 2018-08-13 MED ORDER — MIDAZOLAM HCL 2 MG/2ML IJ SOLN
INTRAMUSCULAR | Status: AC
  Filled 2018-08-13: qty 2

## 2018-08-13 MED ORDER — ONDANSETRON HCL 4 MG/2ML IJ SOLN
INTRAMUSCULAR | Status: AC
  Filled 2018-08-13: qty 2

## 2018-08-13 MED ORDER — FAMOTIDINE 20 MG PO TABS
ORAL_TABLET | ORAL | Status: AC
  Administered 2018-08-13: 20 mg via ORAL
  Filled 2018-08-13: qty 1

## 2018-08-13 MED ORDER — OXYCODONE HCL 5 MG PO TABS: 5.0000 mg | ORAL_TABLET | Freq: Once | ORAL | Status: DC | PRN

## 2018-08-13 MED ORDER — FENTANYL CITRATE (PF) 100 MCG/2ML IJ SOLN
INTRAMUSCULAR | Status: DC | PRN
  Administered 2018-08-13 (×2): 25 ug via INTRAVENOUS
  Administered 2018-08-13: 50 ug via INTRAVENOUS

## 2018-08-13 MED ORDER — DEXAMETHASONE SODIUM PHOSPHATE 10 MG/ML IJ SOLN
INTRAMUSCULAR | Status: AC
  Filled 2018-08-13: qty 1

## 2018-08-13 MED ORDER — EPHEDRINE SULFATE 50 MG/ML IJ SOLN
INTRAMUSCULAR | Status: DC | PRN
  Administered 2018-08-13: 10 mg via INTRAVENOUS

## 2018-08-13 MED ORDER — SUGAMMADEX SODIUM 200 MG/2ML IV SOLN
INTRAVENOUS | Status: AC
  Filled 2018-08-13: qty 2

## 2018-08-13 MED ORDER — PROMETHAZINE HCL 25 MG/ML IJ SOLN: 6.2500 mg | INTRAMUSCULAR | Status: DC | PRN

## 2018-08-13 MED ORDER — OXYCODONE HCL 5 MG/5ML PO SOLN: 5.0000 mg | Freq: Once | ORAL | Status: DC | PRN

## 2018-08-13 MED ORDER — FENTANYL CITRATE (PF) 100 MCG/2ML IJ SOLN
25.0000 ug | INTRAMUSCULAR | Status: DC | PRN
  Administered 2018-08-13 (×2): 25 ug via INTRAVENOUS

## 2018-08-13 MED ORDER — FENTANYL CITRATE (PF) 100 MCG/2ML IJ SOLN
INTRAMUSCULAR | Status: AC
  Filled 2018-08-13: qty 2

## 2018-08-13 MED ORDER — LACTATED RINGERS IV SOLN
INTRAVENOUS | Status: DC
  Administered 2018-08-13: 11:00:00 via INTRAVENOUS

## 2018-08-13 MED ORDER — DOCUSATE SODIUM 100 MG PO CAPS: 100.0000 mg | ORAL_CAPSULE | Freq: Two times a day (BID) | ORAL | 0 refills | Status: DC

## 2018-08-13 MED ORDER — OXYBUTYNIN CHLORIDE 5 MG PO TABS: 5.0000 mg | ORAL_TABLET | Freq: Three times a day (TID) | ORAL | 0 refills | Status: DC | PRN

## 2018-08-13 MED ORDER — TAMSULOSIN HCL 0.4 MG PO CAPS: 0.4000 mg | ORAL_CAPSULE | Freq: Every day | ORAL | 0 refills | Status: DC

## 2018-08-13 MED ORDER — EPHEDRINE SULFATE 50 MG/ML IJ SOLN
INTRAMUSCULAR | Status: AC
Start: 2018-08-13 — End: ?
  Filled 2018-08-13: qty 1

## 2018-08-13 MED ORDER — ROCURONIUM BROMIDE 100 MG/10ML IV SOLN
INTRAVENOUS | Status: DC | PRN
  Administered 2018-08-13: 20 mg via INTRAVENOUS

## 2018-08-13 MED ORDER — MEPERIDINE HCL 50 MG/ML IJ SOLN: 6.2500 mg | INTRAMUSCULAR | Status: DC | PRN

## 2018-08-13 MED ORDER — IOTHALAMATE MEGLUMINE 43 % IV SOLN
INTRAVENOUS | Status: DC | PRN
  Administered 2018-08-13: 15 mL via URETHRAL

## 2018-08-13 MED ORDER — PROPOFOL 10 MG/ML IV BOLUS
INTRAVENOUS | Status: AC
  Filled 2018-08-13: qty 20

## 2018-08-13 MED ORDER — DEXAMETHASONE SODIUM PHOSPHATE 10 MG/ML IJ SOLN
INTRAMUSCULAR | Status: DC | PRN
  Administered 2018-08-13: 5 mg via INTRAVENOUS

## 2018-08-13 MED ORDER — FAMOTIDINE 20 MG PO TABS
20.0000 mg | ORAL_TABLET | Freq: Once | ORAL | Status: AC
  Administered 2018-08-13: 20 mg via ORAL

## 2018-08-13 MED ORDER — CEFAZOLIN SODIUM-DEXTROSE 2-4 GM/100ML-% IV SOLN
INTRAVENOUS | Status: AC
  Filled 2018-08-13: qty 100

## 2018-08-13 MED ORDER — SUGAMMADEX SODIUM 200 MG/2ML IV SOLN
INTRAVENOUS | Status: DC | PRN
  Administered 2018-08-13: 120 mg via INTRAVENOUS

## 2018-08-13 MED ORDER — ONDANSETRON HCL 4 MG/2ML IJ SOLN
INTRAMUSCULAR | Status: DC | PRN
  Administered 2018-08-13: 4 mg via INTRAVENOUS

## 2018-08-13 SURGICAL SUPPLY — 35 items
ADH LQ OCL WTPRF AMP STRL LF (MISCELLANEOUS) ×1
ADHESIVE MASTISOL STRL (MISCELLANEOUS) ×2 IMPLANT
BAG DRAIN CYSTO-URO LG1000N (MISCELLANEOUS) ×3 IMPLANT
BASKET ZERO TIP 1.9FR (BASKET) ×2 IMPLANT
BRUSH SCRUB EZ 1% IODOPHOR (MISCELLANEOUS) ×3 IMPLANT
BSKT STON RTRVL ZERO TP 1.9FR (BASKET) ×1
CATH URETL 5X70 OPEN END (CATHETERS) ×3 IMPLANT
CNTNR SPEC 2.5X3XGRAD LEK (MISCELLANEOUS) ×1
CONRAY 43 FOR UROLOGY 50M (MISCELLANEOUS) ×3 IMPLANT
CONT SPEC 4OZ STER OR WHT (MISCELLANEOUS) ×2
CONT SPEC 4OZ STRL OR WHT (MISCELLANEOUS) ×1
CONTAINER SPEC 2.5X3XGRAD LEK (MISCELLANEOUS) IMPLANT
DRAPE UTILITY 15X26 TOWEL STRL (DRAPES) ×3 IMPLANT
DRSG TEGADERM 2-3/8X2-3/4 SM (GAUZE/BANDAGES/DRESSINGS) ×2 IMPLANT
FIBER LASER LITHO 273 (Laser) ×2 IMPLANT
GLOVE BIO SURGEON STRL SZ 6.5 (GLOVE) ×2 IMPLANT
GLOVE BIO SURGEONS STRL SZ 6.5 (GLOVE) ×1
GOWN STRL REUS W/ TWL LRG LVL3 (GOWN DISPOSABLE) ×2 IMPLANT
GOWN STRL REUS W/TWL LRG LVL3 (GOWN DISPOSABLE) ×6
GUIDEWIRE GREEN .038 145CM (MISCELLANEOUS) ×2 IMPLANT
INFUSOR MANOMETER BAG 3000ML (MISCELLANEOUS) ×3 IMPLANT
INTRODUCER DILATOR DOUBLE (INTRODUCER) ×2 IMPLANT
KIT TURNOVER CYSTO (KITS) ×3 IMPLANT
PACK CYSTO AR (MISCELLANEOUS) ×3 IMPLANT
SENSORWIRE 0.038 NOT ANGLED (WIRE) ×3
SET CYSTO W/LG BORE CLAMP LF (SET/KITS/TRAYS/PACK) ×3 IMPLANT
SHEATH URETERAL 12FRX35CM (MISCELLANEOUS) ×2 IMPLANT
SOL .9 NS 3000ML IRR  AL (IV SOLUTION) ×2
SOL .9 NS 3000ML IRR AL (IV SOLUTION) ×1
SOL .9 NS 3000ML IRR UROMATIC (IV SOLUTION) ×1 IMPLANT
STENT URET 6FRX24 CONTOUR (STENTS) ×2 IMPLANT
STENT URET 6FRX26 CONTOUR (STENTS) IMPLANT
SURGILUBE 2OZ TUBE FLIPTOP (MISCELLANEOUS) ×3 IMPLANT
WATER STERILE IRR 1000ML POUR (IV SOLUTION) ×3 IMPLANT
WIRE SENSOR 0.038 NOT ANGLED (WIRE) ×1 IMPLANT

## 2018-08-13 NOTE — Op Note (Signed)
Date of procedure: 08/13/18  Preoperative diagnosis:  1. Right ureteral stone 2. Right nephrolithiasis  Postoperative diagnosis:  1. Same as above  Procedure: 1. Right ureteroscopy with laser lithotripsy 2. Right retrograde pyelogram 3. Right basket extraction of stone fragment 4. Right ureteral stent placement  Surgeon: Hollice Espy, MD  Anesthesia: General  Complications: None  Intraoperative findings: Right proximal ureteral stone in unchanged position with associated hydronephrosis and surrounding edema.  Nonobstructing stones including largest 8 mm right lower pole treated.  All fragments removed via basket.  EBL: Minimal  Specimens: Stone fragment  Drains: 6 x 24 French double-J ureteral stent on right, tether left in place  Indication: Nicole Rich is a 57 y.o. patient with a 4 mm right proximal obstructing ureteral stone and additional nonobstructing stones.  After reviewing the management options for treatment, she elected to proceed with the above surgical procedure(s). We have discussed the potential benefits and risks of the procedure, side effects of the proposed treatment, the likelihood of the patient achieving the goals of the procedure, and any potential problems that might occur during the procedure or recuperation. Informed consent has been obtained.  Description of procedure:  The patient was taken to the operating room and general anesthesia was induced.  The patient was placed in the dorsal lithotomy position, prepped and draped in the usual sterile fashion, and preoperative antibiotics were administered. A preoperative time-out was performed.   A 21 French cystoscope was advanced per urethra into the bladder.  Attention was turned to the right ureteral orifice which was cannulated using a 5 [place] open-ended ureteral catheter.  Gentle retrograde pyelogram was performed which revealed mild hydroureteronephrosis to level the proximal ureter where there is  a small filling defect concerning for persistent stone at this location.  A sensor wire was then able to be placed around the level of the stone with some difficulty but ultimately successful.  A dual-lumen access sheath was then used to introduce a Super Stiff wire just proximal to the level of the stone where it coiled.  A 12/14 Cook access sheath was then advanced just distal to the stone without difficulty.  The inner lumen was removed.  An 8 French dual-lumen flexible Wolf ureteroscope was then advanced up to level the stone.  A 273 m laser fiber was then brought in and using settings of 0.8 J and 10 Hz, the stone was fragmented.  Each of these fragments was then extracted using 1.9 [place] tipless nitinol basket.  The scope was then advanced into the kidney were nonobstructing stones were encountered, the largest of which measured 8 mm in the right lower pole.  These were also fragmented and all pieces were extracted via basket.  Each every calyx was then directly visualized.  A final retrograde pyelogram showed no contrast extravasation and creating a roadmap to ensure that all stone had been adequately removed.  There is no significant residual stone burden appreciated.  The scope was then backed down the length of the ureter removing the access sheath along the way.  There is no ureteral injuries appreciated and no ureteral stone fragments.  A 6 x 24 French double-J ureteral stent was then advanced over the safety wire up to level the kidney.  The wires partially drawn until focal stone within the renal pelvis.  The wires and fully withdrawn a full coils noted within the bladder.  The string was left affixed to the distal coil of the stent which was taped to the  patient's left inner thigh using Mastisol and Tegaderm.  She was then clean and dry, repositioned supine position, reversed anesthesia, taken the PACU in stable condition.  Plan: Patient will remove her own stent on Monday.  She will follow-up in  4 weeks with renal ultrasound prior.  Hollice Espy, M.D.

## 2018-08-13 NOTE — Discharge Instructions (Addendum)
You have a ureteral stent in place.  This is a tube that extends from your kidney to your bladder.  This may cause urinary bleeding, burning with urination, and urinary frequency.  Please call our office or present to the ED if you develop fevers >101 or pain which is not able to be controlled with oral pain medications.  You may be given either Flomax and/ or ditropan to help with bladder spasms and stent pain in addition to pain medications.     You have a stent attached to string.  On Monday morning, please untaped the stent and pulled gently until the entire stent is removed.  If you have any difficulties, please call our office for assistance.  If the stent is pulled prematurely, please call our office during office hours.  This is not an emergency.  Do your best to avoid accidental dislodgment.  [facility] [address] [place], [place] [address] [phone]    AMBULATORY SURGERY  DISCHARGE INSTRUCTIONS   1) The drugs that you were given will stay in your system until tomorrow so for the next 24 hours you should not:  A) Drive an automobile B) Make any legal decisions C) Drink any alcoholic beverage   2) You may resume regular meals tomorrow.  Today it is better to start with liquids and gradually work up to solid foods.  You may eat anything you prefer, but it is better to start with liquids, then soup and crackers, and gradually work up to solid foods.   3) Please notify your doctor immediately if you have any unusual bleeding, trouble breathing, redness and pain at the surgery site, drainage, fever, or pain not relieved by medication.    4) Additional Instructions:        Please contact your physician with any problems or Same Day Surgery at [phone], Monday through Friday 6 am to 4 pm, or [place] at [facility] number at [phone].  AMBULATORY SURGERY  DISCHARGE INSTRUCTIONS   5) The drugs that you were  given will stay in your system until tomorrow so for the next 24 hours you should not:  D) Drive an automobile E) Make any legal decisions F) Drink any alcoholic beverage   6) You may resume regular meals tomorrow.  Today it is better to start with liquids and gradually work up to solid foods.  You may eat anything you prefer, but it is better to start with liquids, then soup and crackers, and gradually work up to solid foods.   7) Please notify your doctor immediately if you have any unusual bleeding, trouble breathing, redness and pain at the surgery site, drainage, fever, or pain not relieved by medication.    8) Additional Instructions:        Please contact your physician with any problems or Same Day Surgery at [phone], Monday through Friday 6 am to 4 pm, or [place] at [facility] number at [phone].

## 2018-08-13 NOTE — Transfer of Care (Signed)
Immediate Anesthesia Transfer of Care Note  Patient: Nicole Rich [place]  Procedure(s) Performed: CYSTOSCOPY/URETEROSCOPY/HOLMIUM LASER/STENT PLACEMENT (Right Ureter)  Patient Location: PACU  Anesthesia Type:General  Level of Consciousness: awake, alert  and oriented  Airway & Oxygen Therapy: Patient Spontanous Breathing and Patient connected to face mask oxygen  Post-op Assessment: Report given to RN and Post -op Vital signs reviewed and stable  Post vital signs: Reviewed and stable  Last Vitals:  Vitals Value Taken Time  BP 103/63 08/13/2018 12:19 PM  Temp    Pulse 78 08/13/2018 12:19 PM  Resp 12 08/13/2018 12:19 PM  SpO2 100 % 08/13/2018 12:19 PM    Last Pain:  Vitals:   08/13/18 1001  TempSrc: Oral         Complications: No apparent anesthesia complications

## 2018-08-13 NOTE — Anesthesia Procedure Notes (Signed)
Procedure Name: Intubation Date/Time: 08/13/2018 11:11 AM Performed by: Jonna Clark, CRNA Pre-anesthesia Checklist: Patient identified, Patient being monitored, Timeout performed, Emergency Drugs available and Suction available Patient Re-evaluated:Patient Re-evaluated prior to induction Oxygen Delivery Method: Circle system utilized Preoxygenation: Pre-oxygenation with 100% oxygen Induction Type: IV induction Ventilation: Mask ventilation without difficulty Laryngoscope Size: 3 and McGraph Grade View: Grade III Tube type: Oral Tube size: 7.0 mm Number of attempts: 2 Airway Equipment and Method: Stylet Placement Confirmation: ETT inserted through vocal cords under direct vision,  positive ETCO2 and breath sounds checked- equal and bilateral Secured at: 21 cm Tube secured with: Tape Dental Injury: Teeth and Oropharynx as per pre-operative assessment  Difficulty Due To: Difficulty was anticipated and Difficult Airway- due to anterior larynx

## 2018-08-13 NOTE — Anesthesia Post-op Follow-up Note (Signed)
Anesthesia QCDR form completed.        

## 2018-08-13 NOTE — Interval H&P Note (Signed)
History and Physical Interval Note:  08/13/2018 10:46 AM  Nicole Rich  has presented today for surgery, with the diagnosis of right ureteral and right renal stones  The various methods of treatment have been discussed with the patient and family. After consideration of risks, benefits and other options for treatment, the patient has consented to  Procedure(s): CYSTOSCOPY/URETEROSCOPY/HOLMIUM LASER/STENT PLACEMENT (Right) as a surgical intervention .  The patient's history has been reviewed, patient examined, no change in status, stable for surgery.  I have reviewed the patient's chart and labs.  Questions were answered to the patient's satisfaction.    RRR CTAB   Hollice Espy

## 2018-08-13 NOTE — Anesthesia Postprocedure Evaluation (Signed)
Anesthesia Post Note  Patient: Nicole Rich  Procedure(s) Performed: CYSTOSCOPY/URETEROSCOPY/HOLMIUM LASER/STENT PLACEMENT (Right Ureter)  Patient location during evaluation: PACU Anesthesia Type: General Level of consciousness: awake and alert and oriented Pain management: pain level controlled Vital Signs Assessment: post-procedure vital signs reviewed and stable Respiratory status: spontaneous breathing, nonlabored ventilation and respiratory function stable Cardiovascular status: blood pressure returned to baseline and stable Postop Assessment: no signs of nausea or vomiting Anesthetic complications: no     Last Vitals:  Vitals:   08/13/18 1241 08/13/18 1249  BP:  (!) 96/59  Pulse: (!) 59 (!) 47  Resp: (!) 24 15  Temp:    SpO2: 94% 94%    Last Pain:  Vitals:   08/13/18 1249  TempSrc:   PainSc: 2                  Margurete Guaman

## 2018-08-13 NOTE — Anesthesia Preprocedure Evaluation (Signed)
Anesthesia Evaluation  Patient identified by MRN, date of birth, ID band Patient awake    Reviewed: Allergy & Precautions, NPO status , Patient's Chart, lab work & pertinent test results  History of Anesthesia Complications Negative for: history of anesthetic complications  Airway Mallampati: II  TM Distance: >3 FB Neck ROM: Full    Dental  (+) Chipped   Pulmonary neg sleep apnea, neg COPD, former smoker,    breath sounds clear to auscultation- rhonchi (-) wheezing      Cardiovascular Exercise Tolerance: Good (-) hypertension(-) CAD, (-) Past MI, (-) Cardiac Stents and (-) CABG  Rhythm:Regular Rate:Normal - Systolic murmurs and - Diastolic murmurs    Neuro/Psych PSYCHIATRIC DISORDERS Anxiety negative neurological ROS     GI/Hepatic Neg liver ROS, hiatal hernia, GERD  ,  Endo/Other  negative endocrine ROSneg diabetes  Renal/GU Renal disease: nephrolithiasis.     Musculoskeletal  (+) Arthritis ,   Abdominal (+) - obese,   Peds  Hematology negative hematology ROS (+)   Anesthesia Other Findings Past Medical History: No date: Anxiety No date: Arthritis     Comment:  neck No date: Family history of adverse reaction to anesthesia     Comment:  dad can only stay sedated for a certain amount of time               per pt No date: Foot drop, right foot     Comment:  WEARS BRACE No date: GERD (gastroesophageal reflux disease) No date: History of hiatal hernia No date: History of kidney stones No date: Peripheral neuropathy No date: TMJ (dislocation of temporomandibular joint)   Reproductive/Obstetrics                             Anesthesia Physical Anesthesia Plan  ASA: II  Anesthesia Plan: General   Post-op Pain Management:    Induction: Intravenous  PONV Risk Score and Plan: 2 and Ondansetron, Dexamethasone and Midazolam  Airway Management Planned: Oral ETT  Additional  Equipment:   Intra-op Plan:   Post-operative Plan: Extubation in OR  Informed Consent: I have reviewed the patients History and Physical, chart, labs and discussed the procedure including the risks, benefits and alternatives for the proposed anesthesia with the patient or authorized representative who has indicated his/her understanding and acceptance.   Dental advisory given  Plan Discussed with: CRNA and Anesthesiologist  Anesthesia Plan Comments:         Anesthesia Quick Evaluation

## 2018-08-19 LAB — STONE ANALYSIS
CA OXALATE, MONOHYDR.: 90 %
CA PHOS CRY STONE QL IR: 10 %
Stone Weight KSTONE: 32.3 mg

## 2018-08-27 ENCOUNTER — Ambulatory Visit (INDEPENDENT_AMBULATORY_CARE_PROVIDER_SITE_OTHER): Payer: 59 | Admitting: Urology

## 2018-08-27 DIAGNOSIS — N201 Calculus of ureter: Secondary | ICD-10-CM

## 2018-08-27 LAB — URINALYSIS, COMPLETE
BILIRUBIN UA: NEGATIVE
Glucose, UA: NEGATIVE
Ketones, UA: NEGATIVE
Leukocytes, UA: NEGATIVE
NITRITE UA: NEGATIVE
PROTEIN UA: NEGATIVE
RBC UA: NEGATIVE
Specific Gravity, UA: 1.01 (ref 1.005–1.030)
UUROB: 0.2 mg/dL (ref 0.2–1.0)
pH, UA: 6.5 (ref 5.0–7.5)

## 2018-08-27 LAB — MICROSCOPIC EXAMINATION
RBC MICROSCOPIC, UA: NONE SEEN /HPF (ref 0–2)
WBC UA: NONE SEEN /HPF (ref 0–5)

## 2018-08-27 NOTE — Progress Notes (Signed)
Patient was scheduled for cystoscopy, stent removal in the office today, however, she removed her own stent by string last week.  She does not need this appointment today.  We will cancel and have her follow-up as scheduled in 4 weeks with renal ultrasound prior.  Hollice Espy, MD

## 2018-09-03 ENCOUNTER — Ambulatory Visit (INDEPENDENT_AMBULATORY_CARE_PROVIDER_SITE_OTHER): Payer: 59 | Admitting: Adult Health

## 2018-09-03 ENCOUNTER — Encounter: Payer: Self-pay | Admitting: Adult Health

## 2018-09-03 VITALS — BP 96/62 | HR 58 | Resp 16 | Ht 65.0 in | Wt 141.0 lb

## 2018-09-03 DIAGNOSIS — Z23 Encounter for immunization: Secondary | ICD-10-CM

## 2018-09-03 DIAGNOSIS — K219 Gastro-esophageal reflux disease without esophagitis: Secondary | ICD-10-CM

## 2018-09-03 DIAGNOSIS — N838 Other noninflammatory disorders of ovary, fallopian tube and broad ligament: Secondary | ICD-10-CM | POA: Diagnosis not present

## 2018-09-03 DIAGNOSIS — N200 Calculus of kidney: Secondary | ICD-10-CM | POA: Diagnosis not present

## 2018-09-03 NOTE — Progress Notes (Signed)
[facility] [place], [place] [address]  Internal MEDICINE  Office Visit Note  Patient Name: Nicole Rich  [id number]  [id number]  Date of Service: 09/03/2018  Chief Complaint  Patient presents with  . Nephrolithiasis    pt had stent and lithotripsy    HPI Pt here for follow up to Nephrolithiasis, GERD and previous visits to OB-GYN, and urology.  She was seen in urology and her kidney stones were removed without difficulty. She is feeling much better after lithotripsy, and manual removal of stones.  She had a temporary stent which she removed per instructions after 5 days.  She also was seen by OB-GYN for the incidental finding of her enlarged ovary.    Current Medication: Outpatient Encounter Medications as of 09/03/2018  Medication Sig  . ALPRAZolam (XANAX) 0.5 MG tablet Take 1 tablet (0.5 mg total) by mouth 2 (two) times daily as needed for anxiety. (Patient taking differently: Take 0.5 mg by mouth See admin instructions. Take 0.5 mg by mouth at bedtime. Take 0.5 mg by mouth daily if needed for anxiety)  . Cyanocobalamin (VITAMIN B 12 PO) Take 1 tablet by mouth daily.  Marland Kitchen docusate sodium (COLACE) 100 MG capsule Take 1 capsule (100 mg total) by mouth 2 (two) times daily.  Marland Kitchen ibuprofen (ADVIL,MOTRIN) 200 MG tablet Take 600 mg by mouth every 6 (six) hours as needed for headache or moderate pain.  . nortriptyline (PAMELOR) 10 MG capsule Start Nortriptyline (Pamelor) 10 mg nightly for one week, then increase to 20 mg nightly for (Patient taking differently: Take 20 mg by mouth at bedtime. )  . Omega-3 Fatty Acids (FISH OIL PO) Take 4,000 mg by mouth daily.  Marland Kitchen OVER THE COUNTER MEDICATION Take 2 capsules by mouth 2 (two) times daily.  . pregabalin (LYRICA) 75 MG capsule Take 1 capsule (75 mg total) by mouth 2 (two) times daily.  . tamsulosin (FLOMAX) 0.4 MG CAPS capsule Take 1 capsule (0.4 mg total) by mouth daily.  Marland Kitchen oxybutynin (DITROPAN) 5 MG tablet Take 1 tablet (5  mg total) by mouth every 8 (eight) hours as needed for bladder spasms. (Patient not taking: Reported on 09/03/2018)  . [DISCONTINUED] ondansetron (ZOFRAN ODT) 4 MG disintegrating tablet Take 1 tablet (4 mg total) by mouth every 8 (eight) hours as needed for nausea or vomiting. (Patient not taking: Reported on 08/01/2018)  . [DISCONTINUED] OVER THE COUNTER MEDICATION Take 2 capsules by mouth every morning. Amberen Menopause Relief  . [DISCONTINUED] oxyCODONE-acetaminophen (PERCOCET) 5-325 MG tablet Take 1-2 tablets by mouth every 4 (four) hours as needed for severe pain. (Patient not taking: Reported on 09/03/2018)  . [DISCONTINUED] tamsulosin (FLOMAX) 0.4 MG CAPS capsule Take 1 capsule (0.4 mg total) by mouth daily. (Patient taking differently: Take 0.4 mg by mouth daily after breakfast. )   No facility-administered encounter medications on file as of 09/03/2018.     Surgical History: Past Surgical History:  Procedure Laterality Date  . BREAST BIOPSY Left 1996   NEG  . CESAREAN SECTION    . CYSTOSCOPY/URETEROSCOPY/HOLMIUM LASER/STENT PLACEMENT Right 08/13/2018   Procedure: CYSTOSCOPY/URETEROSCOPY/HOLMIUM LASER/STENT PLACEMENT;  Surgeon: Hollice Espy, MD;  Location: [facility] ORS;  Service: Urology;  Laterality: Right;  . FOOT SURGERY Right    mortons neuroma  . TUBAL LIGATION    . UMBILICAL HERNIA REPAIR      Medical History: Past Medical History:  Diagnosis Date  . Anxiety   . Arthritis    neck  . Family history  of adverse reaction to anesthesia    dad can only stay sedated for a certain amount of time per pt  . Foot drop, right foot    WEARS BRACE  . GERD (gastroesophageal reflux disease)   . History of hiatal hernia   . History of kidney stones   . Peripheral neuropathy   . TMJ (dislocation of temporomandibular joint)     Family History: Family History  Problem Relation Age of Onset  . Cancer Mother   . Diabetes Father   . Hypertension Father   . Cancer Brother   . Breast  cancer Neg Hx     Social History   Socioeconomic History  . Marital status: Married    Spouse name: Not on file  . Number of children: Not on file  . Years of education: Not on file  . Highest education level: Not on file  Occupational History  . Not on file  Social Needs  . Financial resource strain: Not on file  . Food insecurity:    Worry: Not on file    Inability: Not on file  . Transportation needs:    Medical: Not on file    Non-medical: Not on file  Tobacco Use  . Smoking status: Former Smoker    Packs/day: 0.50    Years: 40.00    Pack years: 20.00    Types: Cigarettes    Last attempt to quit: 08/06/2016    Years since quitting: 2.0  . Smokeless tobacco: Never Used  Substance and Sexual Activity  . Alcohol use: No  . Drug use: No  . Sexual activity: Yes  Lifestyle  . Physical activity:    Days per week: 7 days    Minutes per session: 60 min  . Stress: Not at all  Relationships  . Social connections:    Talks on phone: Three times a week    Gets together: Three times a week    Attends religious service: Never    Active member of club or organization: No    Attends meetings of clubs or organizations: Never    Relationship status: Married  . Intimate partner violence:    Fear of current or ex partner: No    Emotionally abused: No    Physically abused: No    Forced sexual activity: No  Other Topics Concern  . Not on file  Social History Narrative  . Not on file   Review of Systems  Constitutional: Negative for chills, fatigue and unexpected weight change.  HENT: Negative for congestion, rhinorrhea, sneezing and sore throat.   Eyes: Negative for photophobia, pain and redness.  Respiratory: Negative for cough, chest tightness and shortness of breath.   Cardiovascular: Negative for chest pain and palpitations.  Gastrointestinal: Negative for abdominal pain, constipation, diarrhea, nausea and vomiting.  Endocrine: Negative.   Genitourinary: Negative for  dysuria and frequency.  Musculoskeletal: Negative for arthralgias, back pain, joint swelling and neck pain.  Skin: Negative for rash.  Allergic/Immunologic: Negative.   Neurological: Negative for tremors and numbness.  Hematological: Negative for adenopathy. Does not bruise/bleed easily.  Psychiatric/Behavioral: Negative for behavioral problems and sleep disturbance. The patient is not nervous/anxious.    Vital Signs: BP 96/62   Pulse (!) 58   Resp 16   Ht 5\' 5"  (1.651 m)   Wt 141 lb (64 kg)   SpO2 98%   BMI 23.46 kg/m   Physical Exam  Constitutional: She is oriented to person, place, and time. She appears  well-developed and well-nourished. No distress.  HENT:  Head: Normocephalic and atraumatic.  Mouth/Throat: Oropharynx is clear and moist. No oropharyngeal exudate.  Eyes: Pupils are equal, round, and reactive to light. EOM are normal.  Neck: Normal range of motion. Neck supple. No JVD present. No tracheal deviation present. No thyromegaly present.  Cardiovascular: Normal rate, regular rhythm and normal heart sounds. Exam reveals no gallop and no friction rub.  No murmur heard. Pulmonary/Chest: Effort normal and breath sounds normal. No respiratory distress. She has no wheezes. She has no rales. She exhibits no tenderness.  Abdominal: Soft. There is no tenderness. There is no guarding.  Musculoskeletal: Normal range of motion.  Lymphadenopathy:    She has no cervical adenopathy.  Neurological: She is alert and oriented to person, place, and time. No cranial nerve deficit.  Skin: Skin is warm and dry. She is not diaphoretic.  Psychiatric: She has a normal mood and affect. Her behavior is normal. Judgment and thought content normal.  Nursing note and vitals reviewed.  Assessment/Plan: 1. Nephrolithiasis Resolved by Urology with stent and lithotripsy.    2. Enlarged ovary Benign, per GYN.  Pt denies pain or discomfort.   3. Gastroesophageal reflux disease without  esophagitis Continues to deny symptoms.    4. Flu vaccine need - Flu Vaccine MDCK QUAD PF  General Counseling: Norah verbalizes understanding of the findings of todays visit and agrees with plan of treatment. I have discussed any further diagnostic evaluation that may be needed or ordered today. We also reviewed her medications today. she has been encouraged to call the office with any questions or concerns that should arise related to todays visit.  Orders Placed This Encounter  Procedures  . Flu Vaccine MDCK QUAD PF   Time spent: 25 Minutes This patient was seen by Orson Gear AGNP-C in Collaboration with Dr Lavera Guise as a part of collaborative care agreement   Dr Lavera Guise Internal medicine

## 2018-09-03 NOTE — Patient Instructions (Signed)

## 2018-09-10 ENCOUNTER — Encounter: Payer: Self-pay | Admitting: Urology

## 2018-09-10 ENCOUNTER — Ambulatory Visit: Payer: 59 | Admitting: Urology

## 2018-09-15 ENCOUNTER — Ambulatory Visit
Admission: RE | Admit: 2018-09-15 | Discharge: 2018-09-15 | Disposition: A | Discharge status: Home / Self Care | Payer: 59 | Source: Ambulatory Visit | Attending: Urology | Admitting: Urology

## 2018-09-15 DIAGNOSIS — N201 Calculus of ureter: Secondary | ICD-10-CM | POA: Diagnosis not present

## 2018-09-16 ENCOUNTER — Telehealth: Payer: Self-pay | Admitting: Urology

## 2018-09-16 NOTE — Telephone Encounter (Signed)
Left message to call back to schedule app  Sharyn Lull

## 2018-09-16 NOTE — Telephone Encounter (Signed)
Lm to cb to schedule app with Larene Beach   MB

## 2018-09-16 NOTE — Telephone Encounter (Signed)
-----   Message from Hollice Espy, MD sent at 09/15/2018  2:21 PM EDT ----- This patient was supposed to have a follow-up with [facility] right after her renal ultrasound.  It looks like she does not have this appointment.  Please arrange.    Hollice Espy, MD

## 2018-09-16 NOTE — Telephone Encounter (Signed)
-----   Message from Hollice Espy, MD sent at 09/15/2018  2:21 PM EDT ----- This patient was supposed to have a follow-up with [facility] right after her renal ultrasound.  It looks like she does not have this appointment.  Please arrange.    Hollice Espy, MD

## 2018-09-18 NOTE — Telephone Encounter (Signed)
Appt. Made

## 2018-09-22 ENCOUNTER — Telehealth: Payer: Self-pay | Admitting: Urology

## 2018-09-22 ENCOUNTER — Encounter: Payer: Self-pay | Admitting: Urology

## 2018-09-22 ENCOUNTER — Ambulatory Visit (INDEPENDENT_AMBULATORY_CARE_PROVIDER_SITE_OTHER): Payer: 59 | Admitting: Urology

## 2018-09-22 VITALS — BP 112/76 | HR 67 | Ht 65.0 in | Wt 140.1 lb

## 2018-09-22 DIAGNOSIS — N201 Calculus of ureter: Secondary | ICD-10-CM

## 2018-09-22 DIAGNOSIS — N200 Calculus of kidney: Secondary | ICD-10-CM

## 2018-09-22 DIAGNOSIS — N132 Hydronephrosis with renal and ureteral calculous obstruction: Secondary | ICD-10-CM

## 2018-09-22 NOTE — Telephone Encounter (Signed)
Would you order a Litholink on this patient? 

## 2018-09-22 NOTE — Telephone Encounter (Signed)
faxed

## 2018-09-22 NOTE — Progress Notes (Signed)
09/22/2018 9:39 AM   Nicole Rich 1961/09/22 [id number]  Referring provider: Ronnell Freshwater, NP [address] [place], [place] [address]  No chief complaint on file.   HPI: Patient is a 57 year old Caucasian female with a history of nephrolithiasis who is status-post right URS for a right ureteral stone.  Background history Patient is a 57 year old Caucasian female who was referred by [facility] ED for nephrolithiasis.  She presented to the ED on 07/12/2018 with the complaint of right flank pain radiating to the right lower quadrant associated with nausea and vomiting.   CT Renal stone study noted 4 mm calculus at the right UPJ with resultant mild to moderate right hydronephrosis.  Additional bilateral nonobstructive renal calculi measuring up to 8 mm on the right and 6 mm on the left.  Asymmetric fullness of the left ovary without discrete lesion.  Consider nonemergent pelvic ultrasound for further evaluation.  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 Unchanged associated mild biliary ductal dilatation in the left hepatic lobe.  Aortic atherosclerosis   Labs in the ED:  Creatinine 0.68.  WBC count 18.4.  UA > 50 RBC's and 11-20 WBC's.  Meds given in the ED: Keflex, Toradol IM, Zofran ODT, Percocet and Flomax.  Prior urological history:  No prior history of stones.  Current NSAID/anticoagulation:   ASA 81 mg.  She hasn't taken the ASA in one week.     She underwent a right URS/LL/stent placement on 08/13/2018 with Dr. Erlene Quan.  The post surgical course was as expected and uneventful.  She removed her own stent.    Stone analysis revealed 90% Calcium oxalate monohydrate and 10% Calcium Phosphate composition RUS on 09/15/2018 demonstrated no hydronephrosis bilaterally.  Extrarenal pelvis on the right.  Left lower pole nephrolithiasis.    Today, she does not have any further flank pain.  She  denies any left-sided discomfort.  Patient denies any gross hematuria, dysuria or suprapubic/flank pain.  Patient denies any fevers, chills, nausea or vomiting.     PMH: Past Medical History:  Diagnosis Date  . Anxiety   . Arthritis    neck  . Family history of adverse reaction to anesthesia    dad can only stay sedated for a certain amount of time per pt  . Foot drop, right foot    WEARS BRACE  . GERD (gastroesophageal reflux disease)   . History of hiatal hernia   . History of kidney stones   . Peripheral neuropathy   . TMJ (dislocation of temporomandibular joint)     Surgical History: Past Surgical History:  Procedure Laterality Date  . BREAST BIOPSY Left 1996   NEG  . CESAREAN SECTION    . CYSTOSCOPY/URETEROSCOPY/HOLMIUM LASER/STENT PLACEMENT Right 08/13/2018   Procedure: CYSTOSCOPY/URETEROSCOPY/HOLMIUM LASER/STENT PLACEMENT;  Surgeon: Hollice Espy, MD;  Location: [facility] ORS;  Service: Urology;  Laterality: Right;  . FOOT SURGERY Right    mortons neuroma  . TUBAL LIGATION    . UMBILICAL HERNIA REPAIR      Home Medications:  Allergies as of 09/22/2018   No Known Allergies     Medication List        Accurate as of 09/22/18  9:39 AM. Always use your most recent med list.          ALPRAZolam 0.5 MG tablet Commonly known as:  XANAX Take 1 tablet (0.5 mg total) by mouth 2 (two) times daily  as needed for anxiety.   FISH OIL PO Take 4,000 mg by mouth daily.   nortriptyline 10 MG capsule Commonly known as:  PAMELOR Start Nortriptyline (Pamelor) 10 mg nightly for one week, then increase to 20 mg nightly for   OVER THE COUNTER MEDICATION Take 2 capsules by mouth 2 (two) times daily.   pregabalin 75 MG capsule Commonly known as:  LYRICA Take 1 capsule (75 mg total) by mouth 2 (two) times daily.   tamsulosin 0.4 MG Caps capsule Commonly known as:  FLOMAX Take 1 capsule (0.4 mg total) by mouth daily.   VITAMIN B 12 PO Take 1 tablet by mouth daily.        Allergies: No Known Allergies  Family History: Family History  Problem Relation Age of Onset  . Cancer Mother   . Diabetes Father   . Hypertension Father   . Cancer Brother   . Breast cancer Neg Hx     Social History:  reports that she quit smoking about 2 years ago. Her smoking use included cigarettes. She has a 20.00 pack-year smoking history. She has never used smokeless tobacco. She reports that she does not drink alcohol or use drugs.  ROS: UROLOGY Frequent Urination?: No Hard to postpone urination?: No Burning/pain with urination?: No Get up at night to urinate?: No Leakage of urine?: No Urine stream starts and stops?: No Trouble starting stream?: No Do you have to strain to urinate?: No Blood in urine?: No Urinary tract infection?: No Sexually transmitted disease?: No Injury to kidneys or bladder?: No Painful intercourse?: No Weak stream?: No Currently pregnant?: No Vaginal bleeding?: No Last menstrual period?: n  Gastrointestinal Nausea?: No Vomiting?: No Indigestion/heartburn?: No Diarrhea?: No Constipation?: No  Constitutional Fever: No Night sweats?: Yes Weight loss?: No Fatigue?: Yes  Skin Skin rash/lesions?: No Itching?: No  Eyes Blurred vision?: Yes Double vision?: No  Ears/Nose/Throat Sore throat?: No Sinus problems?: No  Hematologic/Lymphatic Swollen glands?: No Easy bruising?: No  Cardiovascular Leg swelling?: No Chest pain?: No  Respiratory Cough?: No Shortness of breath?: No  Endocrine Excessive thirst?: No  Musculoskeletal Back pain?: No Joint pain?: No  Neurological Headaches?: No Dizziness?: Yes  Psychologic Depression?: No Anxiety?: No  Physical Exam: BP 112/76 (BP Location: Left Arm, Patient Position: Sitting, Cuff Size: Normal)   Pulse 67   Ht 5\' 5"  (1.651 m)   Wt 140 lb 1.6 oz (63.5 kg)   BMI 23.31 kg/m   Constitutional: Well nourished. Alert and oriented, No acute distress. HEENT: [place] AT,  moist mucus membranes. Trachea midline, no masses. Cardiovascular: No clubbing, cyanosis, or edema. Respiratory: Normal respiratory effort, no increased work of breathing. Skin: No rashes, bruises or suspicious lesions. Lymph: No cervical or inguinal adenopathy. Neurologic: Grossly intact, no focal deficits, moving all 4 extremities. Psychiatric: Normal mood and affect.  Laboratory Data: Lab Results  Component Value Date   WBC 18.4 (H) 07/12/2018   HGB 16.6 (H) 07/12/2018   HCT 49.2 (H) 07/12/2018   MCV 89.9 07/12/2018   PLT 295 07/12/2018    Lab Results  Component Value Date   CREATININE 0.68 07/12/2018    No results found for: PSA  No results found for: TESTOSTERONE  No results found for: HGBA1C  Lab Results  Component Value Date   TSH 1.760 01/11/2016    No results found for: CHOL, HDL, CHOLHDL, VLDL, LDLCALC  Lab Results  Component Value Date   AST 26 07/12/2018   Lab Results  Component Value Date  ALT 15 07/12/2018   No components found for: ALKALINEPHOPHATASE No components found for: BILIRUBINTOTAL  No results found for: ESTRADIOL  I have reviewed the labs.   Pertinent Imaging: CLINICAL DATA:  Right ureteral stone.  Post right ureteroscopy  EXAM: RENAL / URINARY TRACT ULTRASOUND COMPLETE  COMPARISON:  07/30/2018  FINDINGS: Right Kidney:  Length: 11.7 cm no hydronephrosis. Mild right extrarenal pelvis. No renal mass.  Left Kidney:  Length: 12.6 cm. 2.9 cm lower pole cyst with thin internal septation. No hydronephrosis. 6 mm lower pole shadowing calculus noted.  Bladder:  Appears normal for degree of bladder distention.  IMPRESSION: No hydronephrosis bilaterally.  Extrarenal pelvis on the right.  Left lower pole nephrolithiasis.   Electronically Signed   By: Rolm Baptise M.D.   On: 09/15/2018 13:06 I have independently reviewed the films.    Assessment & Plan:    1. Right ureteral stone Status post successful  right URS Discussed stone analysis and reviewed general prevention - given ABC's of stone prevention booklet   2. Right hydronephrosis Hydronephrosis has resolved   3. Bilateral nephrolithiasis Will offer 24 hour metabolic work up once recovered from right UPJ stone - order in for [place]  Return for [employer] .  These notes generated with voice recognition software. I apologize for typographical errors.  Zara Council, PA-C  [facility] [address]  [place], [place] [address] [phone]

## 2018-09-22 NOTE — Patient Instructions (Signed)
[facility] Instructions [facility]  You will receive a box/kit in the mail that will have a urine jug and instructions in the kit.  When the box arrives you will need to call our office [phone] to schedule a LAB appointment.  You will need to do a 24hour urine and this should be done during the days that our office will be open.  For example any day from Sunday through Thursday.  How to collect the urine sample: On the day you start the urine sample this 1st morning urine should NOT be collected.  For the rest of the day including all night urines should be collected.  On the next morning the 1st urine should be collected and then you will be finished with the urine collections.  You will need to bring the box with you on your LAB appointment day after urine has been collected and all instructions are complete in the box.  Your blood will be drawn and the box will be collected by our Lab employee to be sent off for analysis.  When urine and blood is complete you will need to schedule a follow up appointment for lab results. 

## 2018-12-26 ENCOUNTER — Encounter: Payer: Self-pay | Admitting: Nurse Practitioner

## 2018-12-26 ENCOUNTER — Ambulatory Visit (INDEPENDENT_AMBULATORY_CARE_PROVIDER_SITE_OTHER): Payer: 59 | Admitting: Nurse Practitioner

## 2018-12-26 VITALS — BP 122/70 | HR 73 | Resp 16 | Ht 65.0 in | Wt 146.2 lb

## 2018-12-26 DIAGNOSIS — R531 Weakness: Secondary | ICD-10-CM

## 2018-12-26 DIAGNOSIS — Z0001 Encounter for general adult medical examination with abnormal findings: Secondary | ICD-10-CM | POA: Diagnosis not present

## 2018-12-26 DIAGNOSIS — Z1239 Encounter for other screening for malignant neoplasm of breast: Secondary | ICD-10-CM

## 2018-12-26 DIAGNOSIS — R5383 Other fatigue: Secondary | ICD-10-CM

## 2018-12-26 DIAGNOSIS — E559 Vitamin D deficiency, unspecified: Secondary | ICD-10-CM

## 2018-12-26 DIAGNOSIS — R300 Dysuria: Secondary | ICD-10-CM

## 2018-12-26 DIAGNOSIS — G629 Polyneuropathy, unspecified: Secondary | ICD-10-CM

## 2018-12-26 DIAGNOSIS — F419 Anxiety disorder, unspecified: Secondary | ICD-10-CM

## 2018-12-26 MED ORDER — ALPRAZOLAM 0.5 MG PO TABS: 0.5000 mg | ORAL_TABLET | Freq: Two times a day (BID) | ORAL | 3 refills | Status: DC | PRN

## 2018-12-26 MED ORDER — PREGABALIN 75 MG PO CAPS: 75.0000 mg | ORAL_CAPSULE | Freq: Two times a day (BID) | ORAL | 3 refills | Status: DC

## 2018-12-26 MED ORDER — NORTRIPTYLINE HCL 10 MG PO CAPS: ORAL_CAPSULE | ORAL | 3 refills | Status: DC

## 2018-12-26 NOTE — Progress Notes (Signed)
[facility] [place], [place] [address]  Internal MEDICINE  Office Visit Note  Patient Name: Nicole Rich  [id number]  [id number]  Date of Service: 12/28/2018    Pt is here for routine health maintenance examination  Chief Complaint  Patient presents with  . Annual Exam    6 month cpe, medication concerns     The patient is here for health maintenance exam. She continues to have low back pain with cramping and numbness in right foot and lower left extremity. 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 Currently taking Lyrica 75mg  twice daily which does help with the pain. Continues to work when possible. Gets tired easily, but doing better, overall. She does have intermittent anxiety. She does take nortriptyline at night and uses alprazolam as needed. These medications help to control symptoms and help her to participate in routine daily activities.    Current Medication: Outpatient Encounter Medications as of 12/26/2018  Medication Sig  . ALPRAZolam (XANAX) 0.5 MG tablet Take 1 tablet (0.5 mg total) by mouth 2 (two) times daily as needed. Take 1 tablet po BID prn anxiety  . Cyanocobalamin (VITAMIN B 12 PO) Take 1 tablet by mouth daily.  . nortriptyline (PAMELOR) 10 MG capsule Take 2 capsules po QPM  . Omega-3 Fatty Acids (FISH OIL PO) Take 4,000 mg by mouth daily.  Marland Kitchen OVER THE COUNTER MEDICATION Take 2 capsules by mouth 2 (two) times daily.  . pregabalin (LYRICA) 75 MG capsule Take 1 capsule (75 mg total) by mouth 2 (two) times daily.  . tamsulosin (FLOMAX) 0.4 MG CAPS capsule Take 1 capsule (0.4 mg total) by mouth daily.  . [DISCONTINUED] ALPRAZolam (XANAX) 0.5 MG tablet Take 1 tablet (0.5 mg total) by mouth 2 (two) times daily as needed for anxiety. (Patient taking differently: Take 0.5 mg by mouth See admin instructions. Take 0.5 mg by mouth at bedtime. Take 0.5 mg by mouth daily if needed for anxiety)  .  [DISCONTINUED] nortriptyline (PAMELOR) 10 MG capsule Start Nortriptyline (Pamelor) 10 mg nightly for one week, then increase to 20 mg nightly for (Patient taking differently: Take 20 mg by mouth at bedtime. )  . [DISCONTINUED] pregabalin (LYRICA) 75 MG capsule Take 1 capsule (75 mg total) by mouth 2 (two) times daily.   No facility-administered encounter medications on file as of 12/26/2018.     Surgical History: Past Surgical History:  Procedure Laterality Date  . BREAST BIOPSY Left 1996   NEG  . CESAREAN SECTION    . CYSTOSCOPY/URETEROSCOPY/HOLMIUM LASER/STENT PLACEMENT Right 08/13/2018   Procedure: CYSTOSCOPY/URETEROSCOPY/HOLMIUM LASER/STENT PLACEMENT;  Surgeon: Hollice Espy, MD;  Location: [facility] ORS;  Service: Urology;  Laterality: Right;  . FOOT SURGERY Right    mortons neuroma  . TUBAL LIGATION    . UMBILICAL HERNIA REPAIR      Medical History: Past Medical History:  Diagnosis Date  . Anxiety   . Arthritis    neck  . Family history of adverse reaction to anesthesia    dad can only stay sedated for a certain amount of time per pt  . Foot drop, right foot    WEARS BRACE  . GERD (gastroesophageal reflux disease)   . History of hiatal hernia   . History of kidney stones   . Peripheral neuropathy   . TMJ (dislocation of temporomandibular joint)     Family History: Family History  Problem Relation Age of Onset  . Cancer Mother   .  Diabetes Father   . Hypertension Father   . Cancer Brother   . Breast cancer Neg Hx       Review of Systems  Constitutional: Positive for fatigue. Negative for activity change, appetite change, chills and unexpected weight change.  HENT: Negative for congestion, postnasal drip, rhinorrhea, sneezing and sore throat.   Respiratory: Negative for cough, chest tightness, shortness of breath and wheezing.   Cardiovascular: Negative for chest pain and palpitations.  Gastrointestinal: Negative for abdominal pain, constipation, diarrhea, nausea  and vomiting.  Endocrine: Negative for cold intolerance, heat intolerance, polydipsia and polyuria.  Genitourinary: Negative for dysuria and frequency.  Musculoskeletal: Positive for arthralgias, back pain, myalgias and neck pain. Negative for joint swelling.  Skin: Negative for rash.  Allergic/Immunologic: Negative.   Neurological: Positive for dizziness, weakness and numbness. Negative for tremors and facial asymmetry.  Hematological: Negative for adenopathy. Does not bruise/bleed easily.  Psychiatric/Behavioral: Negative for behavioral problems (Depression), sleep disturbance and suicidal ideas. The patient is nervous/anxious.        Well managed on current medications.      Today's Vitals   12/26/18 1401  BP: 122/70  Pulse: 73  Resp: 16  SpO2: 98%  Weight: 146 lb 3.2 oz (66.3 kg)  Height: 5\' 5"  (1.651 m)    Physical Exam Vitals signs and nursing note reviewed.  Constitutional:      General: She is not in acute distress.    Appearance: Normal appearance. She is well-developed. She is not diaphoretic.  HENT:     Head: Normocephalic and atraumatic.     Right Ear: Ear canal and external ear normal.     Left Ear: Ear canal and external ear normal.     Nose: Nose normal.     Mouth/Throat:     Pharynx: No oropharyngeal exudate.  Eyes:     Extraocular Movements: Extraocular movements intact.     Conjunctiva/sclera: Conjunctivae normal.     Pupils: Pupils are equal, round, and reactive to light.  Neck:     Musculoskeletal: Normal range of motion and neck supple. Muscular tenderness present. No spinous process tenderness.     Thyroid: No thyromegaly.     Vascular: No JVD.     Trachea: No tracheal tenderness or tracheal deviation.  Cardiovascular:     Rate and Rhythm: Normal rate and regular rhythm.     Pulses: Normal pulses.     Heart sounds: Normal heart sounds. No murmur. No friction rub. No gallop.   Pulmonary:     Effort: Pulmonary effort is normal. No respiratory  distress.     Breath sounds: Normal breath sounds. No wheezing.  Chest:     Chest wall: No tenderness.     Breasts:        Right: Normal. No swelling, bleeding, inverted nipple, mass, nipple discharge, skin change or tenderness.        Left: Normal. No swelling, bleeding, inverted nipple, mass, nipple discharge, skin change or tenderness.  Abdominal:     General: Bowel sounds are normal.     Palpations: Abdomen is soft.     Tenderness: There is no abdominal tenderness.  Musculoskeletal: Normal range of motion.       Back:  Lymphadenopathy:     Cervical: No cervical adenopathy.  Skin:    General: Skin is warm and dry.     Capillary Refill: Capillary refill takes less than 2 seconds.  Neurological:     General: No focal deficit present.  Mental Status: She is alert and oriented to person, place, and time. Mental status is at baseline.  Psychiatric:        Mood and Affect: Mood is anxious.        Behavior: Behavior normal.        Thought Content: Thought content normal.        Judgment: Judgment normal.      LABS: Recent Results (from the past 2160 hour(s))  UA/M w/rflx Culture, Routine     Status: Abnormal (Preliminary result)   Collection Time: 12/26/18  2:10 PM  Result Value Ref Range   Specific Gravity, UA 1.007 1.005 - 1.030   pH, UA 7.0 5.0 - 7.5   Color, UA Yellow Yellow   Appearance Ur Clear Clear   Leukocytes, UA Trace (A) Negative   Protein, UA Negative Negative/Trace   Glucose, UA Negative Negative   Ketones, UA Negative Negative   RBC, UA Negative Negative   Bilirubin, UA Negative Negative   Urobilinogen, Ur 0.2 0.2 - 1.0 mg/dL   Nitrite, UA Negative Negative   Microscopic Examination See below:     Comment: Microscopic was indicated and was performed.   Urinalysis Reflex Comment     Comment: This specimen has reflexed to a Urine Culture.  Microscopic Examination     Status: None   Collection Time: 12/26/18  2:10 PM  Result Value Ref Range   WBC, UA  0-5 0 - 5 /hpf   RBC, UA 0-2 0 - 2 /hpf   Epithelial Cells (non renal) 0-10 0 - 10 /hpf   Casts None seen None seen /lpf   Mucus, UA Present Not Estab.   Bacteria, UA Few None seen/Few  Urine Culture, Reflex     Status: None (Preliminary result)   Collection Time: 12/26/18  2:10 PM  Result Value Ref Range   Urine Culture, Routine WILL FOLLOW    Assessment/Plan: 1. Encounter for general adult medical examination with abnormal findings Annual health maintenance exam today.  - CBC with Differential/Platelet - Comprehensive metabolic panel - Lipid panel  2. Neuropathy Continue pamelor 10mg ,taking two capsules at bedtime. Should also continue lyrica 75mg  twice daily to control neuropathy.  - nortriptyline (PAMELOR) 10 MG capsule; Take 2 capsules po QPM  Dispense: 60 capsule; Refill: 3 - pregabalin (LYRICA) 75 MG capsule; Take 1 capsule (75 mg total) by mouth 2 (two) times daily.  Dispense: 60 capsule; Refill: 3  3. Weakness Check labs and discuss with patient when results are available.  - Comprehensive metabolic panel  4. Other fatigue Check labs.  - T4, free - TSH  5. Vitamin D deficiency - Vitamin D 1,25 dihydroxy  6. Anxiety disorder, unspecified type May continue alprazolam 0.5mg  up to twice daily as needed for acute anxiety. New prescription provided today. - ALPRAZolam (XANAX) 0.5 MG tablet; Take 1 tablet (0.5 mg total) by mouth 2 (two) times daily as needed. Take 1 tablet po BID prn anxiety  Dispense: 45 tablet; Refill: 3 - nortriptyline (PAMELOR) 10 MG capsule; Take 2 capsules po QPM  Dispense: 60 capsule; Refill: 3  7. Screening for breast cancer - MM DIGITAL SCREENING BILATERAL; Future  8. Dysuria - UA/M w/rflx Culture, Routine  General Counseling: katniss weedman understanding of the findings of todays visit and agrees with plan of treatment. I have discussed any further diagnostic evaluation that may be needed or ordered today. We also reviewed her medications  today. she has been encouraged to call the office with any  questions or concerns that should arise related to todays visit.    Counseling:  This patient was seen by Leretha Pol FNP Collaboration with Dr Lavera Guise as a part of collaborative care agreement  Orders Placed This Encounter  Procedures  . Microscopic Examination  . Urine Culture, Reflex  . MM DIGITAL SCREENING BILATERAL  . UA/M w/rflx Culture, Routine  . CBC with Differential/Platelet  . Comprehensive metabolic panel  . T4, free  . TSH  . Lipid panel  . Vitamin D 1,25 dihydroxy    Meds ordered this encounter  Medications  . ALPRAZolam (XANAX) 0.5 MG tablet    Sig: Take 1 tablet (0.5 mg total) by mouth 2 (two) times daily as needed. Take 1 tablet po BID prn anxiety    Dispense:  45 tablet    Refill:  3    Order Specific Question:   Supervising Provider    Answer:   Lavera Guise [[id number]]  . nortriptyline (PAMELOR) 10 MG capsule    Sig: Take 2 capsules po QPM    Dispense:  60 capsule    Refill:  3    Order Specific Question:   Supervising Provider    Answer:   Lavera Guise [[id number]]  . pregabalin (LYRICA) 75 MG capsule    Sig: Take 1 capsule (75 mg total) by mouth 2 (two) times daily.    Dispense:  60 capsule    Refill:  3    Order Specific Question:   Supervising Provider    Answer:   Lavera Guise [[id number]]    Time spent: [place], MD  Internal Medicine

## 2018-12-28 DIAGNOSIS — R300 Dysuria: Secondary | ICD-10-CM | POA: Insufficient documentation

## 2018-12-28 DIAGNOSIS — E559 Vitamin D deficiency, unspecified: Secondary | ICD-10-CM | POA: Insufficient documentation

## 2018-12-28 DIAGNOSIS — Z1239 Encounter for other screening for malignant neoplasm of breast: Secondary | ICD-10-CM | POA: Insufficient documentation

## 2018-12-29 DIAGNOSIS — M5116 Intervertebral disc disorders with radiculopathy, lumbar region: Secondary | ICD-10-CM | POA: Insufficient documentation

## 2018-12-29 LAB — UA/M W/RFLX CULTURE, ROUTINE
BILIRUBIN UA: NEGATIVE
Glucose, UA: NEGATIVE
KETONES UA: NEGATIVE
Nitrite, UA: NEGATIVE
PROTEIN UA: NEGATIVE
RBC, UA: NEGATIVE
SPEC GRAV UA: 1.007 (ref 1.005–1.030)
Urobilinogen, Ur: 0.2 mg/dL (ref 0.2–1.0)
pH, UA: 7 (ref 5.0–7.5)

## 2018-12-29 LAB — MICROSCOPIC EXAMINATION: Casts: NONE SEEN /lpf

## 2018-12-29 LAB — URINE CULTURE, REFLEX

## 2019-03-18 ENCOUNTER — Other Ambulatory Visit: Payer: Self-pay

## 2019-03-18 DIAGNOSIS — F419 Anxiety disorder, unspecified: Secondary | ICD-10-CM

## 2019-03-18 DIAGNOSIS — G629 Polyneuropathy, unspecified: Secondary | ICD-10-CM

## 2019-03-18 MED ORDER — NORTRIPTYLINE HCL 10 MG PO CAPS: ORAL_CAPSULE | ORAL | 1 refills | Status: DC

## 2019-04-28 ENCOUNTER — Ambulatory Visit: Payer: 59 | Admitting: Nurse Practitioner

## 2019-05-28 ENCOUNTER — Other Ambulatory Visit: Payer: Self-pay

## 2019-05-28 DIAGNOSIS — F419 Anxiety disorder, unspecified: Secondary | ICD-10-CM

## 2019-05-28 DIAGNOSIS — G629 Polyneuropathy, unspecified: Secondary | ICD-10-CM

## 2019-05-28 MED ORDER — NORTRIPTYLINE HCL 10 MG PO CAPS: ORAL_CAPSULE | ORAL | 0 refills | Status: DC

## 2019-05-28 NOTE — Telephone Encounter (Signed)
Spoke with pt need appt for further refills send 30 days as dfk

## 2019-05-29 ENCOUNTER — Other Ambulatory Visit: Payer: Self-pay

## 2019-05-29 ENCOUNTER — Encounter: Payer: Self-pay | Admitting: Nurse Practitioner

## 2019-05-29 ENCOUNTER — Ambulatory Visit: Payer: 59 | Admitting: Nurse Practitioner

## 2019-05-29 VITALS — Ht 65.0 in | Wt 142.0 lb

## 2019-05-29 DIAGNOSIS — F419 Anxiety disorder, unspecified: Secondary | ICD-10-CM

## 2019-05-29 DIAGNOSIS — G629 Polyneuropathy, unspecified: Secondary | ICD-10-CM

## 2019-05-29 MED ORDER — PREGABALIN 75 MG PO CAPS: 75.0000 mg | ORAL_CAPSULE | Freq: Two times a day (BID) | ORAL | 3 refills | Status: DC

## 2019-05-29 MED ORDER — ALPRAZOLAM 0.5 MG PO TABS: 0.5000 mg | ORAL_TABLET | Freq: Two times a day (BID) | ORAL | 3 refills | Status: DC | PRN

## 2019-05-29 NOTE — Progress Notes (Signed)
[facility] [place], [place] [address]  Internal MEDICINE  Telephone Visit  Patient Name: Nicole Rich  [id number]  [id number]  Date of Service: 06/09/2019  I connected with the patient at 2:49pm by webcam and verified the patients identity using two identifiers.   I discussed the limitations, risks, security and privacy concerns of performing an evaluation and management service by webcam and the availability of in person appointments. I also discussed with the patient that there may be a patient responsible charge related to the service.  The patient expressed understanding and agrees to proceed.    Chief Complaint  Patient presents with  . Telephone Assessment  . Telephone Screen  . Medical Management of Chronic Issues  . Anxiety  . Gastroesophageal Reflux    The patient has been contacted via webcam for follow up visit due to concerns for spread of novel coronavirus. The patient states that she is currently feels good, however, she is not working due to [place] 19. She states she needs to have refills for her Lyrica and her alprazolam. She would like to hold off having her mammogram until spread of COVID 19 slows.       Current Medication: Outpatient Encounter Medications as of 05/29/2019  Medication Sig  . ALPRAZolam (XANAX) 0.5 MG tablet Take 1 tablet (0.5 mg total) by mouth 2 (two) times daily as needed. Take 1 tablet po BID prn anxiety  . Cyanocobalamin (VITAMIN B 12 PO) Take 1 tablet by mouth daily.  . nortriptyline (PAMELOR) 10 MG capsule Take 2 capsules po QPM  . Omega-3 Fatty Acids (FISH OIL PO) Take 4,000 mg by mouth daily.  Marland Kitchen OVER THE COUNTER MEDICATION Take 2 capsules by mouth 2 (two) times daily.  . pregabalin (LYRICA) 75 MG capsule Take 1 capsule (75 mg total) by mouth 2 (two) times daily.  . [DISCONTINUED] ALPRAZolam (XANAX) 0.5 MG tablet Take 1 tablet (0.5 mg total) by mouth 2 (two) times daily as needed. Take 1 tablet po BID prn anxiety   . [DISCONTINUED] pregabalin (LYRICA) 75 MG capsule Take 1 capsule (75 mg total) by mouth 2 (two) times daily.  . tamsulosin (FLOMAX) 0.4 MG CAPS capsule Take 1 capsule (0.4 mg total) by mouth daily. (Patient not taking: Reported on 05/29/2019)   No facility-administered encounter medications on file as of 05/29/2019.     Surgical History: Past Surgical History:  Procedure Laterality Date  . BREAST BIOPSY Left 1996   NEG  . CESAREAN SECTION    . CYSTOSCOPY/URETEROSCOPY/HOLMIUM LASER/STENT PLACEMENT Right 08/13/2018   Procedure: CYSTOSCOPY/URETEROSCOPY/HOLMIUM LASER/STENT PLACEMENT;  Surgeon: Hollice Espy, MD;  Location: [facility] ORS;  Service: Urology;  Laterality: Right;  . FOOT SURGERY Right    mortons neuroma  . TUBAL LIGATION    . UMBILICAL HERNIA REPAIR      Medical History: Past Medical History:  Diagnosis Date  . Anxiety   . Arthritis    neck  . Family history of adverse reaction to anesthesia    dad can only stay sedated for a certain amount of time per pt  . Foot drop, right foot    WEARS BRACE  . GERD (gastroesophageal reflux disease)   . History of hiatal hernia   . History of kidney stones   . Peripheral neuropathy   . TMJ (dislocation of temporomandibular joint)     Family History: Family History  Problem Relation Age of Onset  . Cancer Mother   . Diabetes Father   .  Hypertension Father   . Cancer Brother   . Breast cancer Neg Hx     Social History   Socioeconomic History  . Marital status: Married    Spouse name: Not on file  . Number of children: Not on file  . Years of education: Not on file  . Highest education level: Not on file  Occupational History  . Not on file  Social Needs  . Financial resource strain: Not on file  . Food insecurity:    Worry: Not on file    Inability: Not on file  . Transportation needs:    Medical: Not on file    Non-medical: Not on file  Tobacco Use  . Smoking status: Former Smoker    Packs/day: 0.50     Years: 40.00    Pack years: 20.00    Types: Cigarettes    Last attempt to quit: 08/06/2016    Years since quitting: 2.8  . Smokeless tobacco: Never Used  Substance and Sexual Activity  . Alcohol use: No  . Drug use: No  . Sexual activity: Yes  Lifestyle  . Physical activity:    Days per week: 7 days    Minutes per session: 60 min  . Stress: Not at all  Relationships  . Social connections:    Talks on phone: Three times a week    Gets together: Three times a week    Attends religious service: Never    Active member of club or organization: No    Attends meetings of clubs or organizations: Never    Relationship status: Married  . Intimate partner violence:    Fear of current or ex partner: No    Emotionally abused: No    Physically abused: No    Forced sexual activity: No  Other Topics Concern  . Not on file  Social History Narrative  . Not on file      Review of Systems  Constitutional: Positive for fatigue. Negative for activity change, appetite change, chills and unexpected weight change.  HENT: Negative for congestion, postnasal drip, rhinorrhea, sneezing and sore throat.   Respiratory: Negative for cough, chest tightness, shortness of breath and wheezing.   Cardiovascular: Negative for chest pain and palpitations.  Gastrointestinal: Negative for abdominal pain, constipation, diarrhea, nausea and vomiting.  Endocrine: Negative for cold intolerance, heat intolerance, polydipsia and polyuria.  Genitourinary: Negative for dysuria and frequency.  Musculoskeletal: Positive for arthralgias, back pain, myalgias and neck pain. Negative for joint swelling.  Skin: Negative for rash.  Allergic/Immunologic: Negative.   Neurological: Positive for dizziness, weakness and numbness. Negative for tremors and facial asymmetry.  Hematological: Negative for adenopathy. Does not bruise/bleed easily.  Psychiatric/Behavioral: Negative for behavioral problems (Depression), sleep disturbance  and suicidal ideas. The patient is nervous/anxious.        Well managed on current medications.     Today's Vitals   05/29/19 1400  Weight: 142 lb (64.4 kg)  Height: 5\' 5"  (1.651 m)   Body mass index is 23.63 kg/m.  Observation/Objective:   The patient is alert and oriented. She is pleasant and answers all questions appropriately. Breathing is non-labored. She is in no acute distress at this time.    Assessment/Plan:  1. Neuropathy patinet may continue lyrica 75mg  twice daily. A new prescription was sent to her pharmacy.  - pregabalin (LYRICA) 75 MG capsule; Take 1 capsule (75 mg total) by mouth 2 (two) times daily.  Dispense: 60 capsule; Refill: 3  2. Anxiety disorder,  unspecified type May continue to take alprazolam 0.5mg  twice ily if needed for acute anxiety.  - ALPRAZolam (XANAX) 0.5 MG tablet; Take 1 tablet (0.5 mg total) by mouth 2 (two) times daily as needed. Take 1 tablet po BID prn anxiety  Dispense: 45 tablet; Refill: 3   General Counseling: ica daye understanding of the findings of today's phone visit and agrees with plan of treatment. I have discussed any further diagnostic evaluation that may be needed or ordered today. We also reviewed her medications today. she has been encouraged to call the office with any questions or concerns that should arise related to todays visit.  Reviewed risks and possible side effects associated with taking opiates, benzodiazepines and other CNS depressants. Combination of these could cause dizziness and drowsiness. Advised patient not to drive or operate machinery when taking these medications, as patient's and other's life can be at risk and will have consequences. Patient verbalized understanding in this matter. Dependence and abuse for these drugs will be monitored closely. A Controlled substance policy and procedure is on file which allows [facility] to order a urine drug screen test at any visit. Patient understands  and agrees with the plan  This patient was seen by Leretha Pol FNP Collaboration with Dr Lavera Guise as a part of collaborative care agreement  Meds ordered this encounter  Medications  . ALPRAZolam (XANAX) 0.5 MG tablet    Sig: Take 1 tablet (0.5 mg total) by mouth 2 (two) times daily as needed. Take 1 tablet po BID prn anxiety    Dispense:  45 tablet    Refill:  3    Order Specific Question:   Supervising Provider    Answer:   Lavera Guise [[id number]]  . pregabalin (LYRICA) 75 MG capsule    Sig: Take 1 capsule (75 mg total) by mouth 2 (two) times daily.    Dispense:  60 capsule    Refill:  3    Order Specific Question:   Supervising Provider    Answer:   Lavera Guise [[id number]]    Time spent: 1 Minutes    Dr Lavera Guise Internal medicine

## 2019-07-22 ENCOUNTER — Other Ambulatory Visit: Payer: Self-pay

## 2019-07-22 DIAGNOSIS — G629 Polyneuropathy, unspecified: Secondary | ICD-10-CM

## 2019-07-22 DIAGNOSIS — F419 Anxiety disorder, unspecified: Secondary | ICD-10-CM

## 2019-07-22 MED ORDER — NORTRIPTYLINE HCL 10 MG PO CAPS: ORAL_CAPSULE | ORAL | 2 refills | Status: DC

## 2019-08-27 IMAGING — US US PELVIS COMPLETE TRANSABD/TRANSVAG
1 series · 13 of 25 positions shown · non-contrast
Comparison: CT 07/12/2017

CLINICAL DATA: Enlarged left ovary on CT

EXAM:
TRANSABDOMINAL AND TRANSVAGINAL ULTRASOUND OF PELVIS
TECHNIQUE: Both transabdominal and transvaginal ultrasound examinations of the
pelvis were performed. Transabdominal technique was performed for
global imaging of the pelvis including uterus, ovaries, adnexal
regions, and pelvic cul-de-sac. It was necessary to proceed with
endovaginal exam following the transabdominal exam to visualize the
endometrium and ovaries.

[Series 1: us pelvis complete transabd/transvag · 0.24mm/px · 13 of 75 slices shown]
[im 1/75]
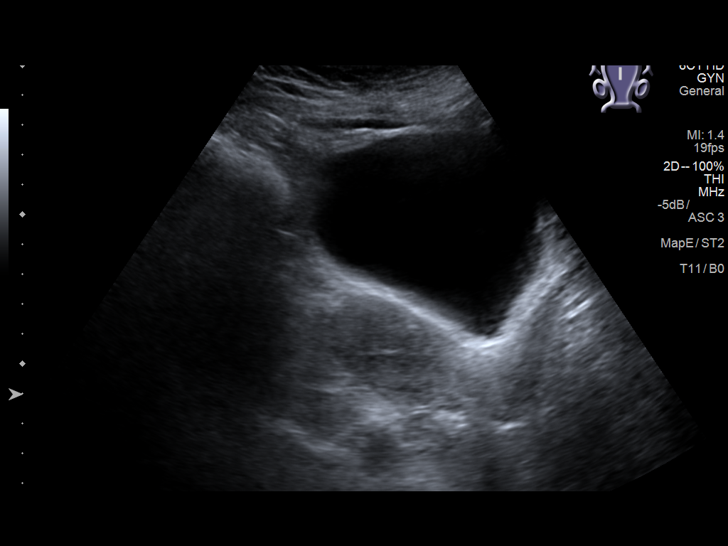
[im 7/75]
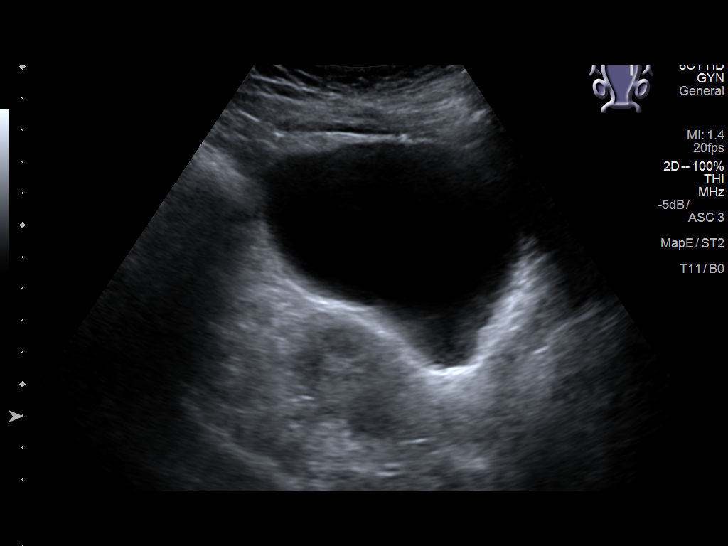
[im 13/75]
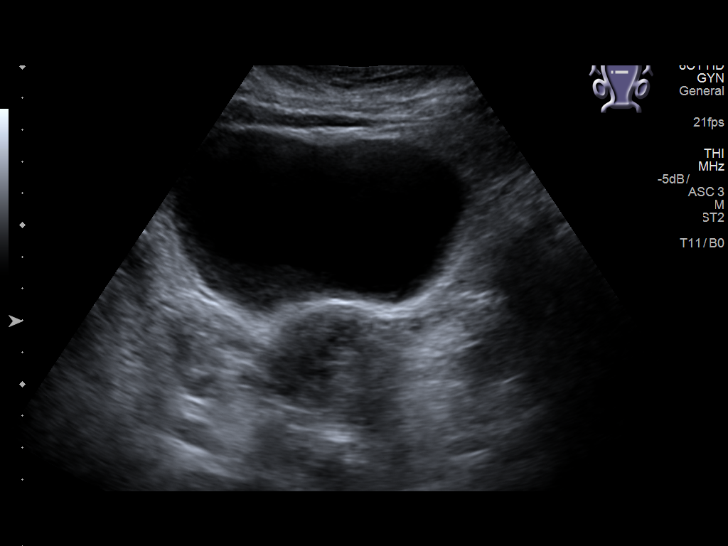
[im 19/75]
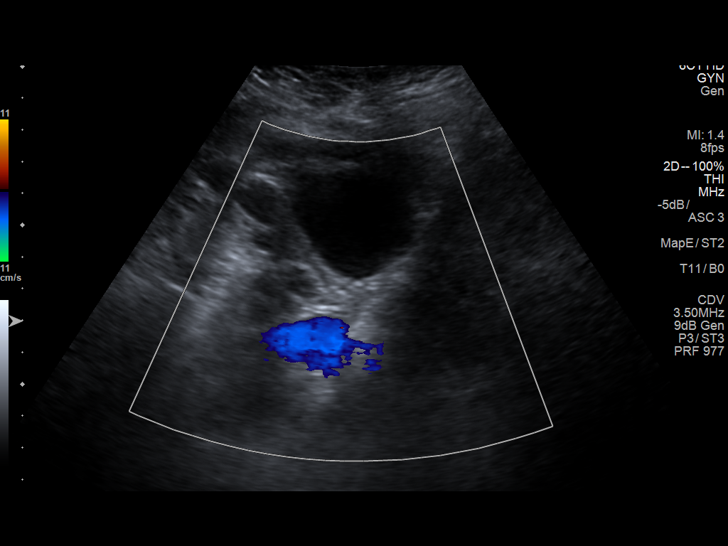
[im 25/75]
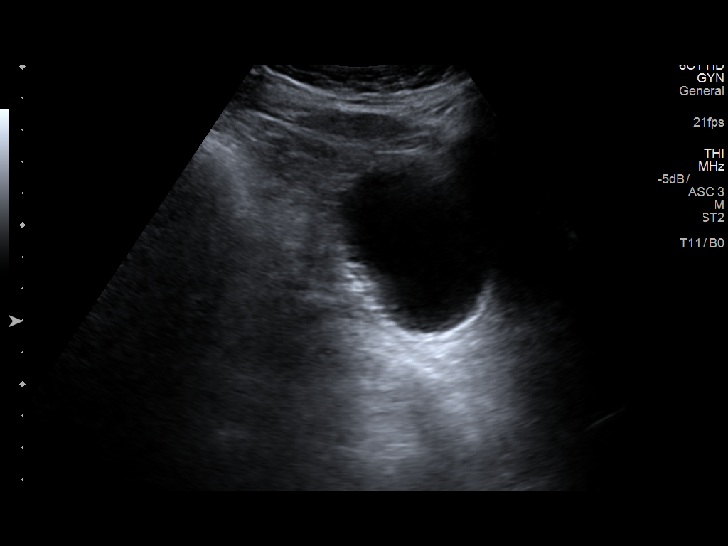
[im 31/75]
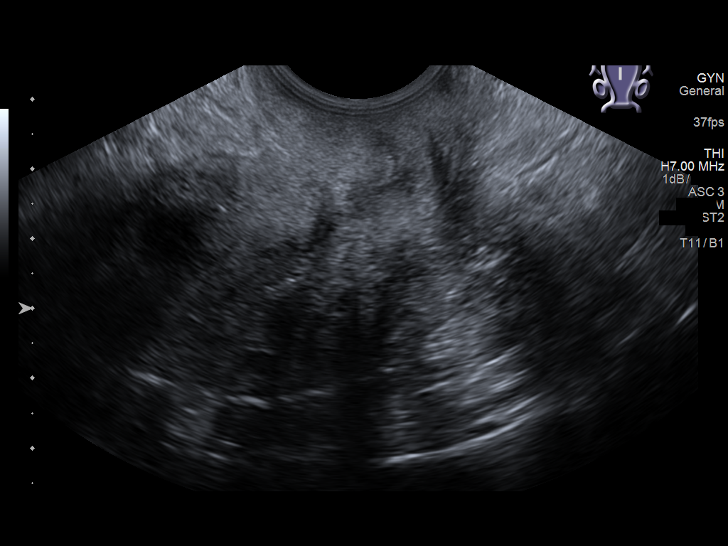
[im 38/75]
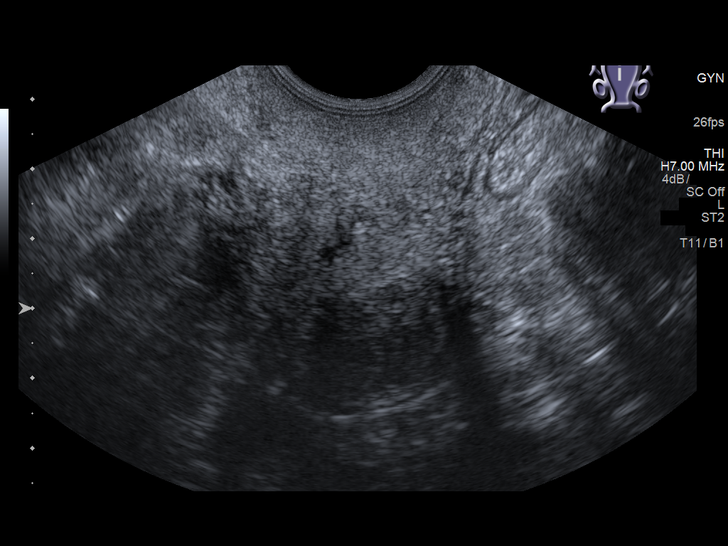
[im 44/75]
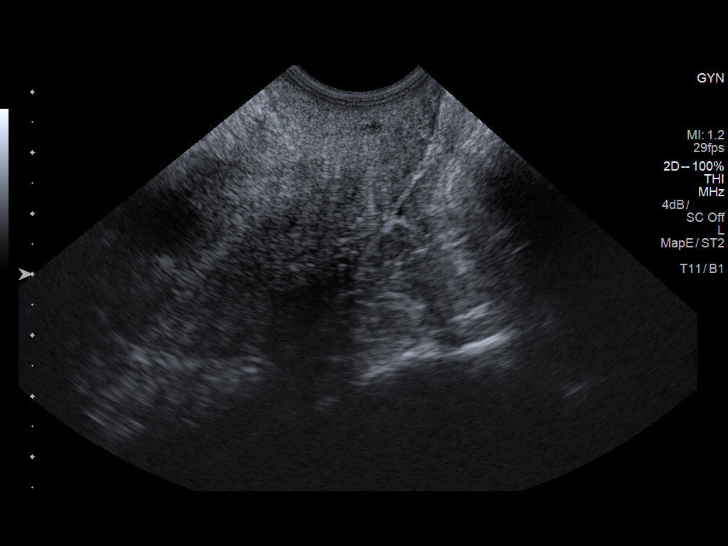
[im 50/75]
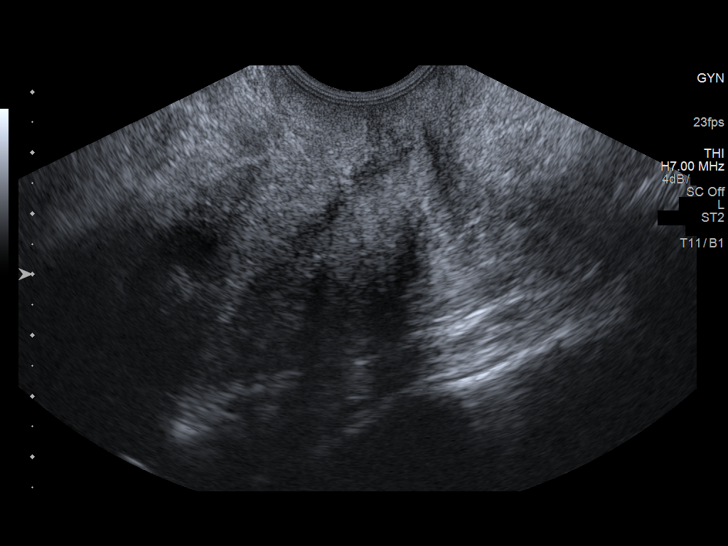
[im 56/75]
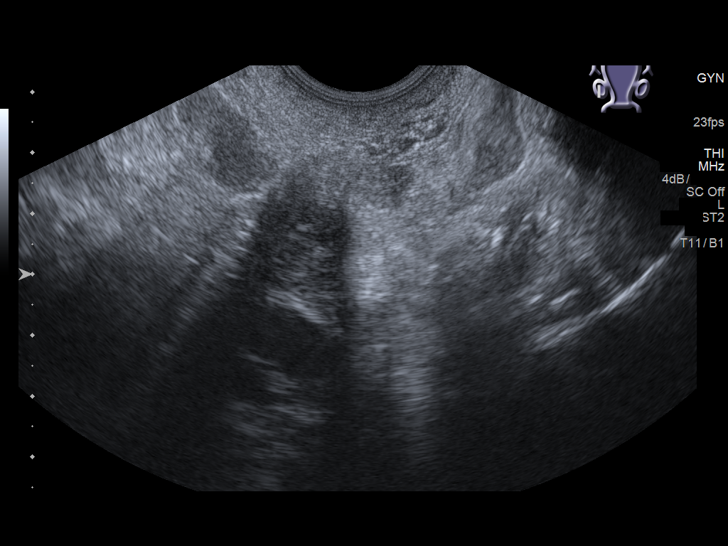
[im 62/75]
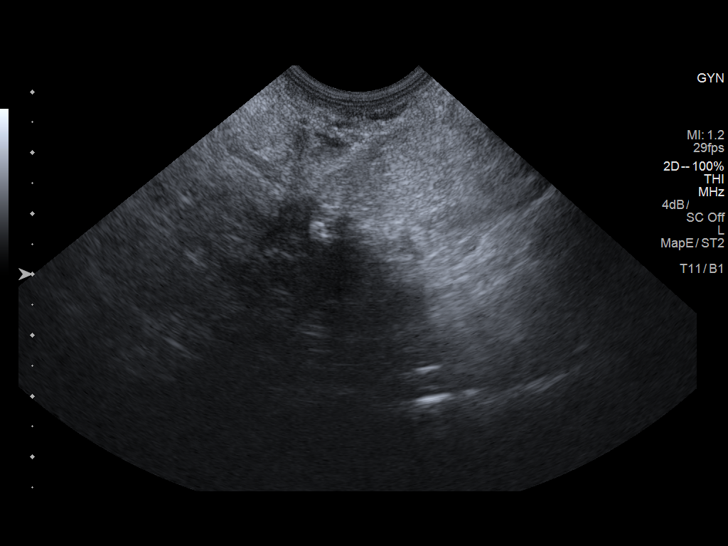
[im 68/75]
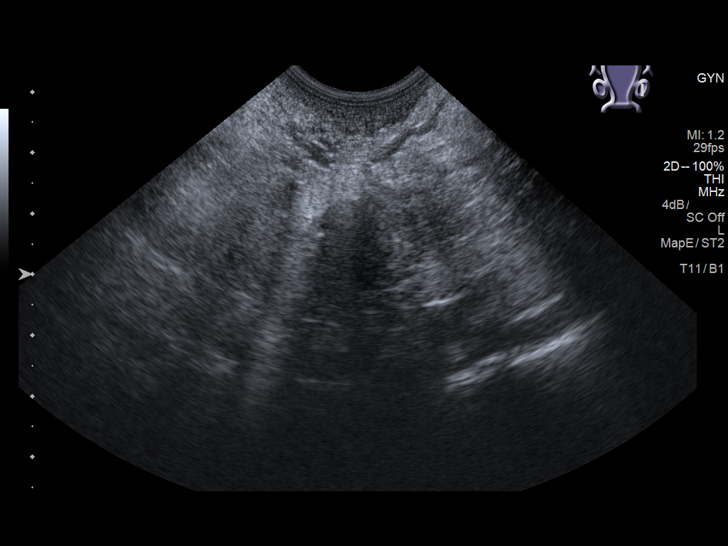
[im 75/75]
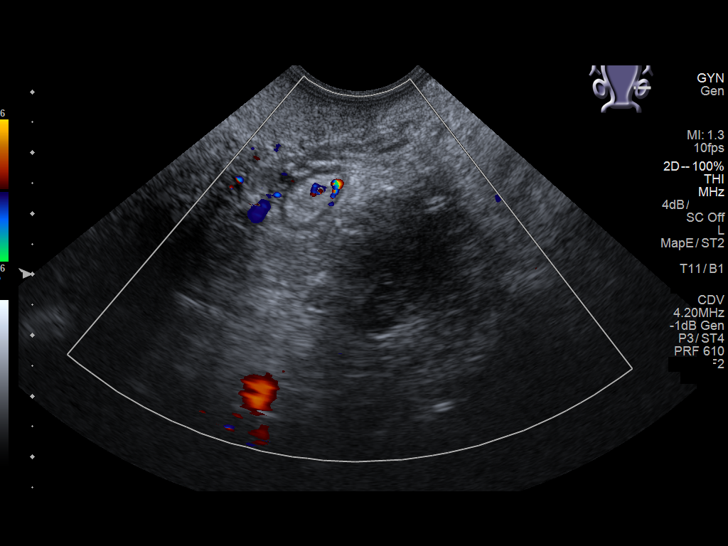

[13 of 25 positions shown; findings below may reference images not displayed]

FINDINGS: Uterus

Measurements: 7.5 x 3 x 3.6 cm.. Multiple hypoechoic fibroids within
the uterus. Partially exophytic fundal fibroid measures 2.5 x 3.4 by
2.7 cm. Posterior intramural fibroid measures 1.5 x 1.7 x 2.2 cm.
Anterior myometrial fibroid measures 1.2 x 1.3 by 1.1 cm.

Endometrium

Thickness: 1.8 mm.  No focal abnormality visualized.

Right ovary

Measurements: 1.6 x 1.4 x 1.1 cm. Normal appearance/no adnexal mass.

Left ovary

Measurements: 2.4 x 2.1 x 1.2 cm. Normal appearance/no adnexal mass.

Other findings

No abnormal free fluid.
IMPRESSION: 1. Negative for ovarian mass. Ovaries within normal limits
sonographically.
2. Multiple uterine fibroids.

## 2019-08-28 ENCOUNTER — Other Ambulatory Visit: Payer: Self-pay

## 2019-08-28 ENCOUNTER — Emergency Department
Admission: EM | Admit: 2019-08-28 | Discharge: 2019-08-28 | Disposition: A | Discharge status: Home / Self Care | Payer: 59 | Attending: Emergency Medicine | Admitting: Emergency Medicine

## 2019-08-28 ENCOUNTER — Emergency Department: Payer: 59

## 2019-08-28 ENCOUNTER — Encounter: Payer: Self-pay | Admitting: Emergency Medicine

## 2019-08-28 DIAGNOSIS — Y9301 Activity, walking, marching and hiking: Secondary | ICD-10-CM | POA: Insufficient documentation

## 2019-08-28 DIAGNOSIS — Z79899 Other long term (current) drug therapy: Secondary | ICD-10-CM | POA: Insufficient documentation

## 2019-08-28 DIAGNOSIS — Y999 Unspecified external cause status: Secondary | ICD-10-CM | POA: Insufficient documentation

## 2019-08-28 DIAGNOSIS — W19XXXA Unspecified fall, initial encounter: Secondary | ICD-10-CM

## 2019-08-28 DIAGNOSIS — Y92007 Garden or yard of unspecified non-institutional (private) residence as the place of occurrence of the external cause: Secondary | ICD-10-CM | POA: Insufficient documentation

## 2019-08-28 DIAGNOSIS — W010XXA Fall on same level from slipping, tripping and stumbling without subsequent striking against object, initial encounter: Secondary | ICD-10-CM | POA: Insufficient documentation

## 2019-08-28 DIAGNOSIS — Z1239 Encounter for other screening for malignant neoplasm of breast: Secondary | ICD-10-CM

## 2019-08-28 DIAGNOSIS — S63502A Unspecified sprain of left wrist, initial encounter: Secondary | ICD-10-CM | POA: Insufficient documentation

## 2019-08-28 DIAGNOSIS — S53402A Unspecified sprain of left elbow, initial encounter: Secondary | ICD-10-CM | POA: Insufficient documentation

## 2019-08-28 DIAGNOSIS — S46912A Strain of unspecified muscle, fascia and tendon at shoulder and upper arm level, left arm, initial encounter: Secondary | ICD-10-CM

## 2019-08-28 DIAGNOSIS — Z87891 Personal history of nicotine dependence: Secondary | ICD-10-CM | POA: Insufficient documentation

## 2019-08-28 MED ORDER — OXYCODONE-ACETAMINOPHEN 5-325 MG PO TABS: 1.0000 | ORAL_TABLET | Freq: Four times a day (QID) | ORAL | 0 refills | Status: DC | PRN

## 2019-08-28 MED ORDER — OXYCODONE-ACETAMINOPHEN 5-325 MG PO TABS
1.0000 | ORAL_TABLET | Freq: Once | ORAL | Status: AC
  Administered 2019-08-28: 1 via ORAL
  Filled 2019-08-28: qty 1

## 2019-08-28 MED ORDER — MELOXICAM 15 MG PO TABS: 15.0000 mg | ORAL_TABLET | Freq: Every day | ORAL | 0 refills | Status: DC

## 2019-08-28 NOTE — ED Notes (Signed)
Pt verbalized understanding of discharge instructions. NAD at this time. 

## 2019-08-28 NOTE — ED Provider Notes (Signed)
[facility] Emergency Department Provider Note  ____________________________________________  Time seen: Approximately 4:30 PM  I have reviewed the triage vital signs and the nursing notes.   HISTORY  Chief Complaint Fall    HPI Nicole Rich is a 58 y.o. female who presents emergency department for evaluation of left elbow, forearm and wrist pain after a fall today.  Patient reports that she has a history of neuropathy to her lower extremity and has very limited sensation to her right foot.  She was working in her garden, stepped backwards and did not feel a raised area of dirt causing her to fall backwards.  She attempted to break her fall with an outstretched left arm.  Patient reports that she landed on an outstretched hand and heard/felt a large pop.  Patient reports that she was able to ambulate inside, place her arm in a sling thinking she had sprained her wrist.  Patient reports that the pain has worsened and she presents concern for fracture.  She did not hit her head or lose consciousness.  She denies any headache, neck pain, back pain, worsening neuropathic symptoms in the lower extremities.  Patient's only complaint at this time is left wrist, forearm and elbow pain.  Patient has a history of anxiety, arthritis, foot drop, GERD, peripheral neuropathy.         Past Medical History:  Diagnosis Date  . Anxiety   . Arthritis    neck  . Family history of adverse reaction to anesthesia    dad can only stay sedated for a certain amount of time per pt  . Foot drop, right foot    WEARS BRACE  . GERD (gastroesophageal reflux disease)   . History of hiatal hernia   . History of kidney stones   . Peripheral neuropathy   . TMJ (dislocation of temporomandibular joint)     Patient Active Problem List   Diagnosis Date Noted  . 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 Screening for breast cancer 12/28/2018  .  Vitamin D deficiency 12/28/2018  . Dysuria 12/28/2018  . Gastroesophageal reflux disease 01/28/2018  . Anxiety disorder 01/28/2018  . Neuropathy 12/12/2017  . Other fatigue 12/12/2017  . Trigeminal neuralgia 07/31/2016  . Numbness and tingling 01/04/2016  . Pain of upper extremity 01/04/2016  . Limb weakness 01/04/2016    Past Surgical History:  Procedure Laterality Date  . BREAST BIOPSY Left 1996   NEG  . CESAREAN SECTION    . CYSTOSCOPY/URETEROSCOPY/HOLMIUM LASER/STENT PLACEMENT Right 08/13/2018   Procedure: CYSTOSCOPY/URETEROSCOPY/HOLMIUM LASER/STENT PLACEMENT;  Surgeon: Hollice Espy, MD;  Location: [facility] ORS;  Service: Urology;  Laterality: Right;  . FOOT SURGERY Right    mortons neuroma  . TUBAL LIGATION    . UMBILICAL HERNIA REPAIR      Prior to Admission medications   Medication Sig Start Date End Date Taking? Authorizing Provider  ALPRAZolam Duanne Moron) 0.5 MG tablet Take 1 tablet (0.5 mg total) by mouth 2 (two) times daily as needed. Take 1 tablet po BID prn anxiety 05/29/19   Ronnell Freshwater, NP  Cyanocobalamin (VITAMIN B 12 PO) Take 1 tablet by mouth daily.    [provider]  meloxicam (MOBIC) 15 MG tablet Take 1 tablet (15 mg total) by mouth daily. 08/28/19   Arlicia Paquette, Charline Bills, PA-C  nortriptyline (PAMELOR) 10 MG capsule Take 2 capsules po QPM 07/22/19   Ronnell Freshwater, NP  Omega-3 Fatty Acids (FISH OIL PO) Take 4,000  mg by mouth daily.    [provider]  OVER THE COUNTER MEDICATION Take 2 capsules by mouth 2 (two) times daily.    [provider]  oxyCODONE-acetaminophen (PERCOCET/ROXICET) 5-325 MG tablet Take 1 tablet by mouth every 6 (six) hours as needed for severe pain. 08/28/19   Quintan Saldivar, Charline Bills, PA-C  pregabalin (LYRICA) 75 MG capsule Take 1 capsule (75 mg total) by mouth 2 (two) times daily. 05/29/19   Ronnell Freshwater, NP  tamsulosin (FLOMAX) 0.4 MG CAPS capsule Take 1 capsule (0.4 mg total) by mouth daily. Patient not  taking: Reported on 05/29/2019 08/13/18   Hollice Espy, MD    Allergies Patient has no known allergies.  Family History  Problem Relation Age of Onset  . Cancer Mother   . Diabetes Father   . Hypertension Father   . Cancer Brother   . Breast cancer Neg Hx     Social History Social History   Tobacco Use  . Smoking status: Former Smoker    Packs/day: 0.50    Years: 40.00    Pack years: 20.00    Types: Cigarettes    Quit date: 08/06/2016    Years since quitting: 3.0  . Smokeless tobacco: Never Used  Substance Use Topics  . Alcohol use: No  . Drug use: No     Review of Systems  Constitutional: No fever/chills Eyes: No visual changes. No discharge ENT: No upper respiratory complaints. Cardiovascular: no chest pain. Respiratory: no cough. No SOB. Gastrointestinal: No abdominal pain.  No nausea, no vomiting.   Musculoskeletal: Positive for left wrist, forearm, elbow injury/pain Skin: Negative for rash, abrasions, lacerations, ecchymosis. Neurological: Negative for headaches, focal weakness or numbness. 10-point ROS otherwise negative.  ____________________________________________   PHYSICAL EXAM:  VITAL SIGNS: ED Triage Vitals  Enc Vitals Group     BP 08/28/19 1516 121/77     Pulse Rate 08/28/19 1516 (!) 56     Resp 08/28/19 1516 16     Temp 08/28/19 1516 98.9 F (37.2 C)     Temp Source 08/28/19 1516 Oral     SpO2 08/28/19 1516 100 %     Weight 08/28/19 1525 150 lb (68 kg)     Height 08/28/19 1525 5\' 5"  (1.651 m)     Head Circumference --      Peak Flow --      Pain Score 08/28/19 1525 8     Pain Loc --      Pain Edu? --      Excl. in [place]? --      Constitutional: Alert and oriented. Well appearing and in no acute distress. Eyes: Conjunctivae are normal. PERRL. EOMI. Head: Atraumatic. ENT:      Ears:       Nose: No congestion/rhinnorhea.      Mouth/Throat: Mucous membranes are moist.  Neck: No stridor.    Cardiovascular: Normal rate, regular  rhythm. Normal S1 and S2.  Good peripheral circulation. Respiratory: Normal respiratory effort without tachypnea or retractions. Lungs CTAB. Good air entry to the bases with no decreased or absent breath sounds. Musculoskeletal: Full range of motion to all extremities. No gross deformities appreciated.  Visualization of the left upper extremity reveals good range of motion to the shoulder, elbow and wrist.  No gross deformities.  Patient does have mild edema about the left elbow when compared with right.  No open wounds, abrasions, lacerations or significant ecchymosis identified.  Patient is able to extend, flex the elbow very  gingerly.  Very limited pronation at this time.  Patient has full range of motion to the wrist joint.  Patient is tender to palpation over both the radial head and olecranon process with no palpable abnormality or deficit.  No ballottement to the elbow.  No significant tenderness to palpation along the forearm but patient does have tenderness to palpation over the distal radius.  No palpable abnormality in this region either.  Radial pulse intact.  Sensation intact all digits left upper extremity. Neurologic:  Normal speech and language. No gross focal neurologic deficits are appreciated.  Skin:  Skin is warm, dry and intact. No rash noted. Psychiatric: Mood and affect are normal. Speech and behavior are normal. Patient exhibits appropriate insight and judgement.   ____________________________________________   LABS (all labs ordered are listed, but only abnormal results are displayed)  Labs Reviewed - No data to display ____________________________________________  EKG   ____________________________________________  RADIOLOGY I personally viewed and evaluated these images as part of my medical decision making, as well as reviewing the written report by the radiologist.  Dg Elbow Complete Left  Result Date: 08/28/2019 CLINICAL DATA:  Fall.  Pain in left forearm and  elbow. EXAM: LEFT WRIST - COMPLETE 3+ VIEW; LEFT FOREARM - 2 VIEW; LEFT ELBOW - COMPLETE 3+ VIEW COMPARISON:  None. FINDINGS: There is no evidence of fracture or dislocation. No elbow joint effusion. There is no evidence of arthropathy or other focal bone abnormality. Soft tissues are unremarkable. IMPRESSION: 1. No acute osseous abnormality of the left wrist, forearm, and elbow. Electronically Signed   By: Titus Dubin M.D.   On: 08/28/2019 16:24   Dg Forearm Left  Result Date: 08/28/2019 CLINICAL DATA:  Fall.  Pain in left forearm and elbow. EXAM: LEFT WRIST - COMPLETE 3+ VIEW; LEFT FOREARM - 2 VIEW; LEFT ELBOW - COMPLETE 3+ VIEW COMPARISON:  None. FINDINGS: There is no evidence of fracture or dislocation. No elbow joint effusion. There is no evidence of arthropathy or other focal bone abnormality. Soft tissues are unremarkable. IMPRESSION: 1. No acute osseous abnormality of the left wrist, forearm, and elbow. Electronically Signed   By: Titus Dubin M.D.   On: 08/28/2019 16:24   Dg Wrist Complete Left  Result Date: 08/28/2019 CLINICAL DATA:  Fall.  Pain in left forearm and elbow. EXAM: LEFT WRIST - COMPLETE 3+ VIEW; LEFT FOREARM - 2 VIEW; LEFT ELBOW - COMPLETE 3+ VIEW COMPARISON:  None. FINDINGS: There is no evidence of fracture or dislocation. No elbow joint effusion. There is no evidence of arthropathy or other focal bone abnormality. Soft tissues are unremarkable. IMPRESSION: 1. No acute osseous abnormality of the left wrist, forearm, and elbow. Electronically Signed   By: Titus Dubin M.D.   On: 08/28/2019 16:24    ____________________________________________    PROCEDURES  Procedure(s) performed:    Procedures    Medications  oxyCODONE-acetaminophen (PERCOCET/ROXICET) 5-325 MG per tablet 1 tablet (1 tablet Oral Given 08/28/19 1619)     ____________________________________________   INITIAL IMPRESSION / ASSESSMENT AND PLAN / ED COURSE  Pertinent labs & imaging results  that were available during my care of the patient were reviewed by me and considered in my medical decision making (see chart for details).  Review of the [place] CSRS was performed in accordance of the [place] prior to dispensing any controlled drugs.           Patient's diagnosis is consistent with fall resulting in wrist and elbow sprain.  Patient presented after  sustaining a fall onto an outstretched hand earlier today.  Patient is having pain to the elbow and wrist.  Imaging reveals no acute fractures.  Patient still has good range of motion and no indication of acute ligamentous rupture.  Patient will continue to use sling at home.  Meloxicam and Percocet at home for pain.  Follow-up with primary care or orthopedics as needed.. Patient is given ED precautions to return to the ED for any worsening or new symptoms.     ____________________________________________  FINAL CLINICAL IMPRESSION(S) / ED DIAGNOSES  Final diagnoses:  Fall, initial encounter  Strain of left elbow, initial encounter  Sprain of left wrist, initial encounter      NEW MEDICATIONS STARTED DURING THIS VISIT:  ED Discharge Orders         Ordered    oxyCODONE-acetaminophen (PERCOCET/ROXICET) 5-325 MG tablet  Every 6 hours PRN     08/28/19 1643    meloxicam (MOBIC) 15 MG tablet  Daily     08/28/19 1643              This chart was dictated using voice recognition software/Dragon. Despite best efforts to proofread, errors can occur which can change the meaning. Any change was purely unintentional.    Darletta Moll, PA-C 08/28/19 1702    Earleen Newport, MD 08/28/19 [phone]

## 2019-08-28 NOTE — ED Triage Notes (Signed)
Patient states she was working outside when she tripped and fell, landing on her left hand. Patient states she heard a "pop" and now having pain in left elbow and forearm. Patient denies hitting head or any other injury with fall.

## 2019-09-29 ENCOUNTER — Ambulatory Visit: Payer: Self-pay | Admitting: Nurse Practitioner

## 2019-10-15 IMAGING — US US RENAL
1 series · 14 of 25 positions shown · non-contrast
Comparison: 07/30/2018

CLINICAL DATA: Right ureteral stone.  Post right ureteroscopy

EXAM:
RENAL / URINARY TRACT ULTRASOUND COMPLETE

[Series 1: us renal · 0.23mm/px · 14 of 40 slices shown]
[im 1/40]
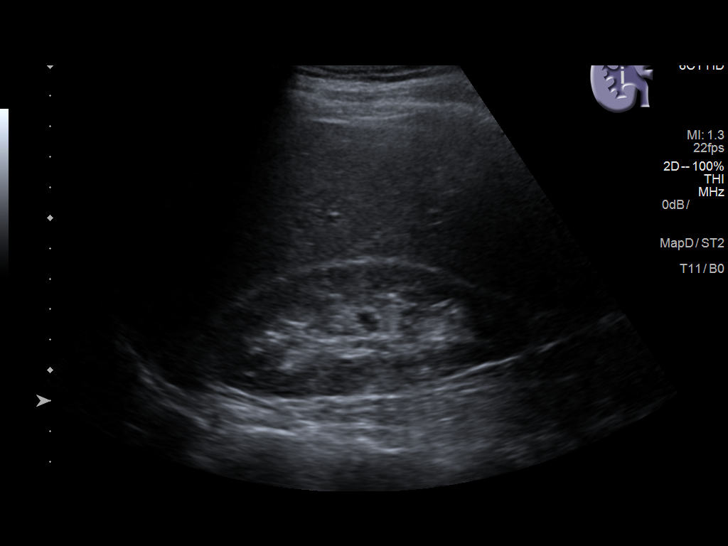
[im 4/40]
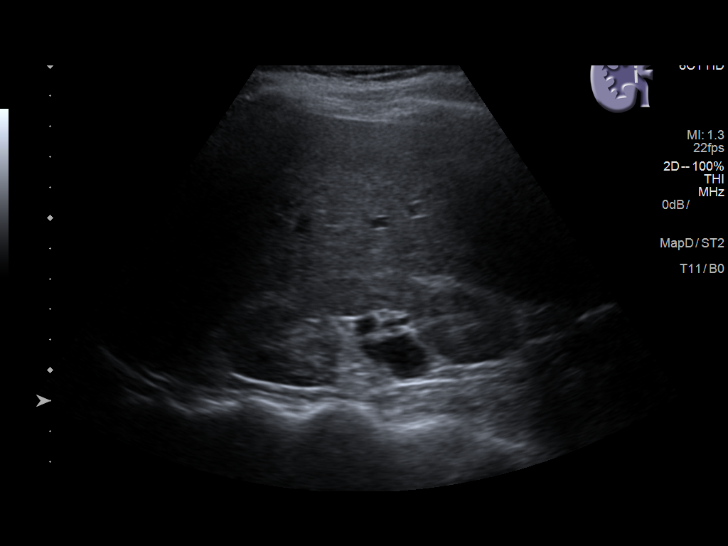
[im 7/40]
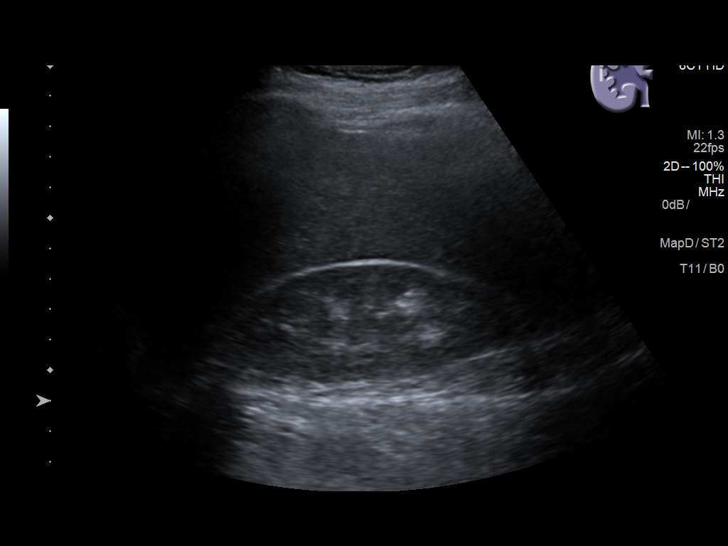
[im 10/40]
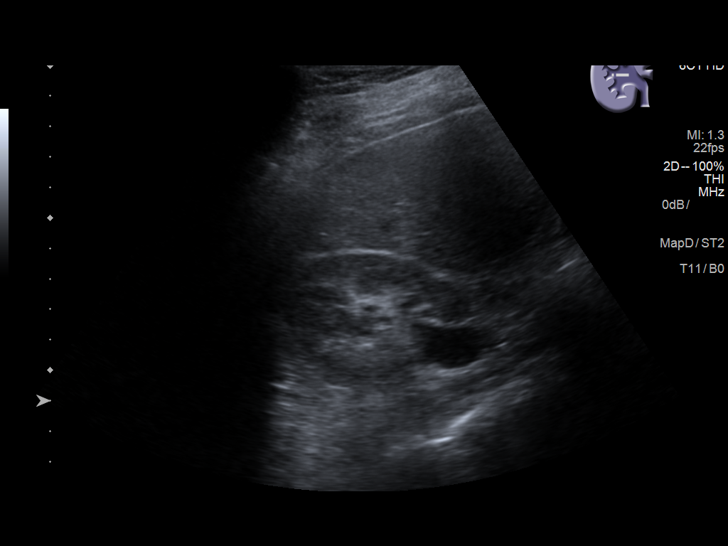
[im 14/40]
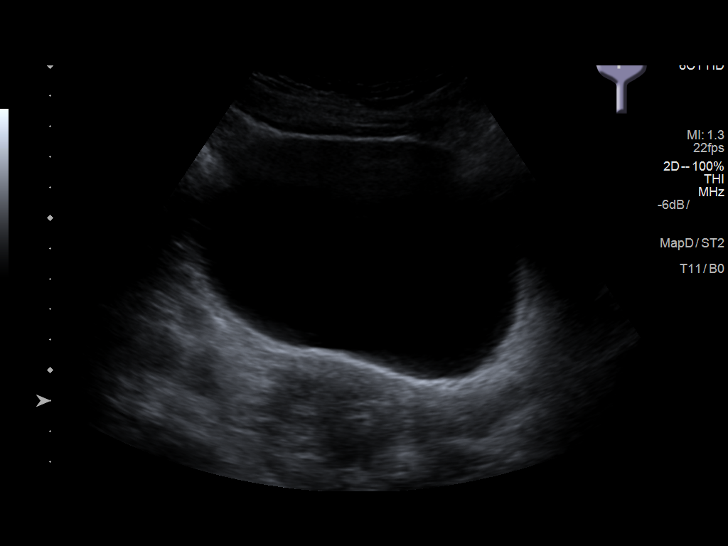
[im 15/40]
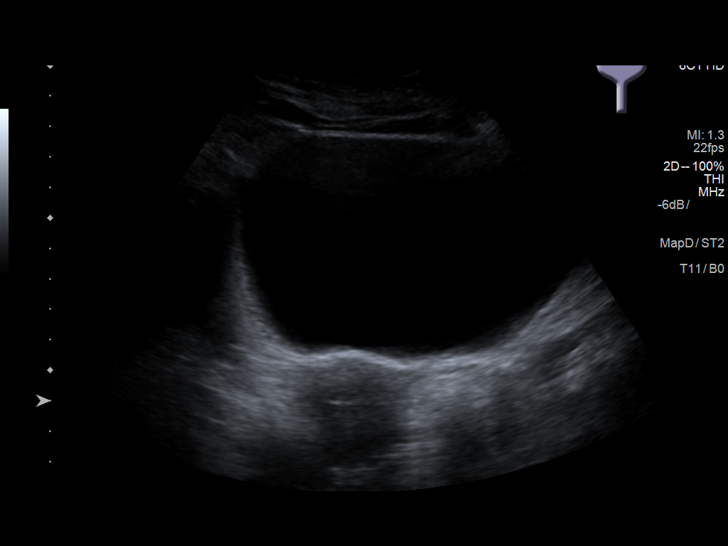
[im 18/40]
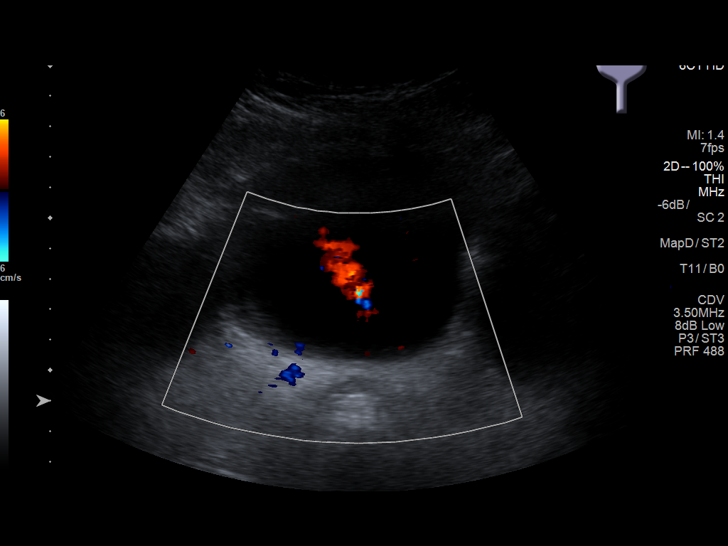
[im 22/40]
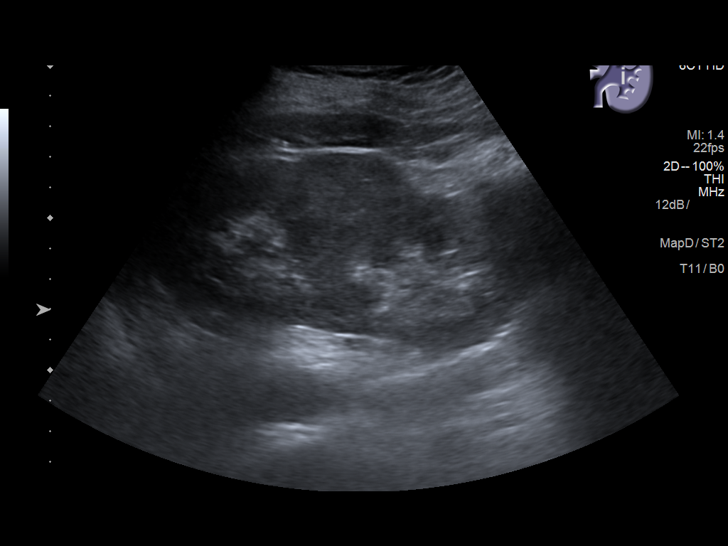
[im 25/40]
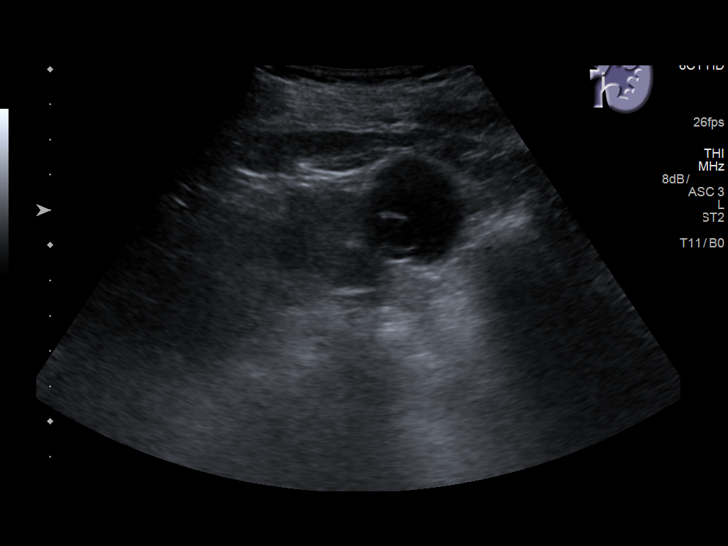
[im 27/40]
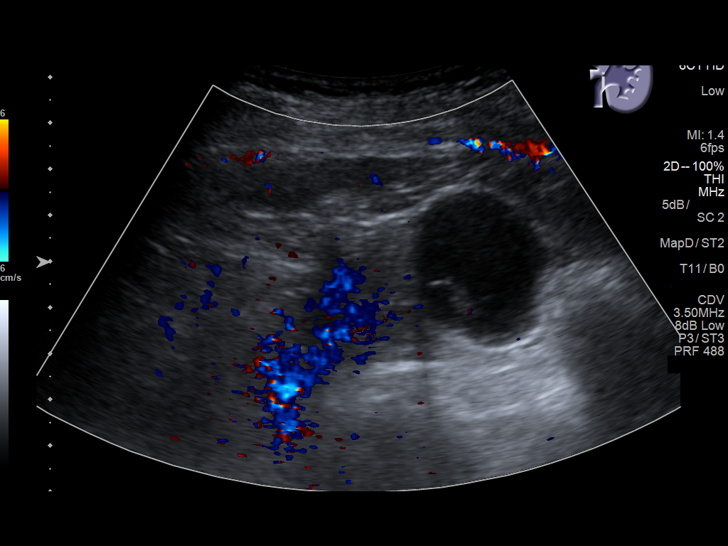
[im 30/40]
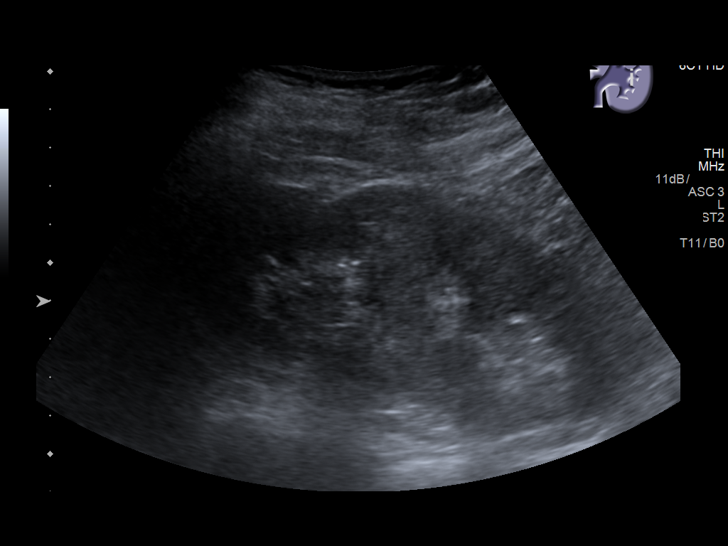
[im 33/40]
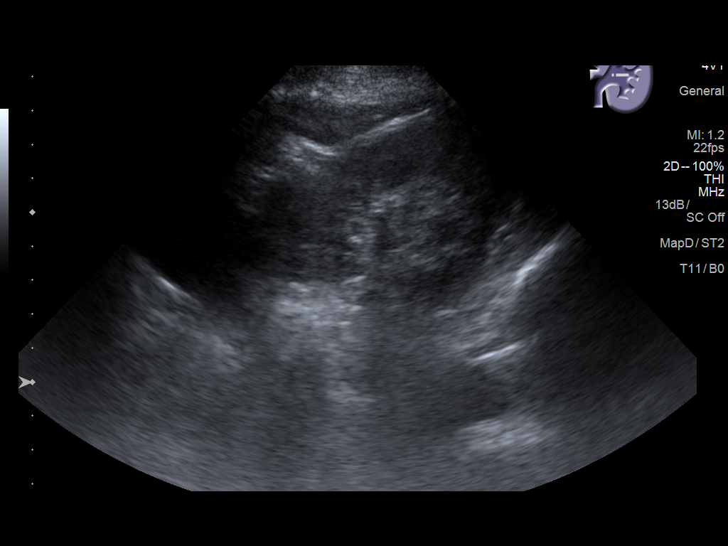
[im 36/40]
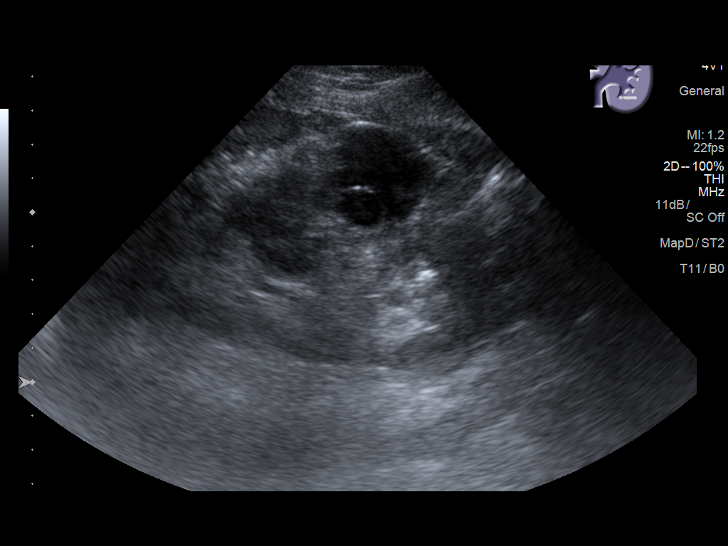
[im 40/40]
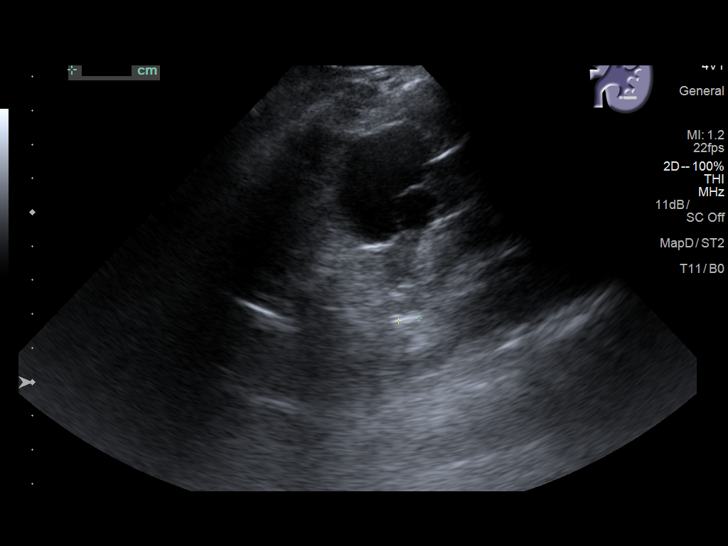

[14 of 25 positions shown; findings below may reference images not displayed]

FINDINGS: Right Kidney:

Length: 11.7 cm no hydronephrosis. Mild right extrarenal pelvis. No
renal mass..

Left Kidney:

Length: 12.6 cm. 2.9 cm lower pole cyst with thin internal
septation. No hydronephrosis. 6 mm lower pole shadowing calculus
noted.

Bladder:

Appears normal for degree of bladder distention.
IMPRESSION: No hydronephrosis bilaterally.  Extrarenal pelvis on the right.

Left lower pole nephrolithiasis.

## 2019-12-24 ENCOUNTER — Telehealth: Payer: Self-pay

## 2019-12-24 NOTE — Telephone Encounter (Signed)
LMOM FOR PATIENT TO CONFIRM AND SCREEN FOR 12-28-19 OV. 

## 2019-12-28 ENCOUNTER — Encounter (INDEPENDENT_AMBULATORY_CARE_PROVIDER_SITE_OTHER): Payer: Self-pay

## 2019-12-28 ENCOUNTER — Encounter: Payer: Self-pay | Admitting: Nurse Practitioner

## 2019-12-28 ENCOUNTER — Other Ambulatory Visit: Payer: Self-pay

## 2019-12-28 ENCOUNTER — Ambulatory Visit: Payer: Self-pay | Admitting: Nurse Practitioner

## 2019-12-28 VITALS — BP 125/79 | HR 76 | Resp 16 | Ht 65.0 in | Wt 161.1 lb

## 2019-12-28 DIAGNOSIS — R42 Dizziness and giddiness: Secondary | ICD-10-CM

## 2019-12-28 DIAGNOSIS — R300 Dysuria: Secondary | ICD-10-CM

## 2019-12-28 DIAGNOSIS — G629 Polyneuropathy, unspecified: Secondary | ICD-10-CM

## 2019-12-28 DIAGNOSIS — F419 Anxiety disorder, unspecified: Secondary | ICD-10-CM

## 2019-12-28 DIAGNOSIS — Z0001 Encounter for general adult medical examination with abnormal findings: Secondary | ICD-10-CM

## 2019-12-28 MED ORDER — NORTRIPTYLINE HCL 25 MG PO CAPS: ORAL_CAPSULE | ORAL | 3 refills | Status: DC

## 2019-12-28 MED ORDER — ALPRAZOLAM 0.5 MG PO TABS
0.5000 mg | ORAL_TABLET | Freq: Two times a day (BID) | ORAL | 3 refills | Status: DC | PRN
Start: 2019-12-28 — End: 2020-05-31

## 2019-12-28 MED ORDER — MECLIZINE HCL 25 MG PO TABS: 25.0000 mg | ORAL_TABLET | Freq: Two times a day (BID) | ORAL | 2 refills | Status: DC | PRN

## 2019-12-28 MED ORDER — PREGABALIN 100 MG PO CAPS: 100.0000 mg | ORAL_CAPSULE | Freq: Two times a day (BID) | ORAL | 3 refills | Status: DC

## 2019-12-28 NOTE — Progress Notes (Signed)
[facility] [place], [place] [address]  Internal MEDICINE  Office Visit Note  Patient Name: Nicole Rich  [id number]  [id number]  Date of Service: 01/02/2020   Pt is here for routine health maintenance examination    Chief Complaint  Patient presents with  . Annual Exam  . Gastroesophageal Reflux  . Arthritis  . Fall    having a problem standing up from chairs, balance is getting worse. feels dizzy most days   . Pain    believes to have breakthrough pain     The patient is here for health maintenance exam. She states that she has had increased vertigo. States that she is having trouble sitting up from a laying down position or standing up from seated position. She can get so dizzy, she loses her balance and will sometimes fall. She states that once, she braced her fall with her right arm and hurt her elbow. Had t o be seen in the ER but had a level two sprain. She used to see neurologist for this, however she has been unable to see them or follow up in some time as she and her husband lost health insurance when her husband lost his job. She has never tried taking meclizine or as needed medication for vertigo.  She does have chronic neuropathy in her legs and arms. She is on Lyrica 75mg  twice daily. She states this has helped a great deal, but the past few months, shooting and stabbing type pains have become much worse. She also feels that trigeminal neuralgia may be getting ready to flare up. She does take nortriptyline for this. Currently on a dose of 20mg  every evening.  She is due to have routine, fasting labs as well as screening mammogram. She wishes to hold off on these tests right now due to lack of health insurance.   Current Medication: Outpatient Encounter Medications as of 12/28/2019  Medication Sig  . ALPRAZolam (XANAX) 0.5 MG tablet Take 1 tablet (0.5 mg total) by mouth 2 (two) times daily as needed. Take 1 tablet po BID prn anxiety  .  Cyanocobalamin (VITAMIN B 12 PO) Take 1 tablet by mouth daily.  . meloxicam (MOBIC) 15 MG tablet Take 1 tablet (15 mg total) by mouth daily.  . nortriptyline (PAMELOR) 25 MG capsule Take 1 to 2 capsules po QPM  . Omega-3 Fatty Acids (FISH OIL PO) Take 4,000 mg by mouth daily.  Marland Kitchen OVER THE COUNTER MEDICATION Take 2 capsules by mouth 2 (two) times daily.  Marland Kitchen oxyCODONE-acetaminophen (PERCOCET/ROXICET) 5-325 MG tablet Take 1 tablet by mouth every 6 (six) hours as needed for severe pain.  . pregabalin (LYRICA) 100 MG capsule Take 1 capsule (100 mg total) by mouth 2 (two) times daily.  . tamsulosin (FLOMAX) 0.4 MG CAPS capsule Take 1 capsule (0.4 mg total) by mouth daily.  . [DISCONTINUED] ALPRAZolam (XANAX) 0.5 MG tablet Take 1 tablet (0.5 mg total) by mouth 2 (two) times daily as needed. Take 1 tablet po BID prn anxiety  . [DISCONTINUED] nortriptyline (PAMELOR) 10 MG capsule Take 2 capsules po QPM  . [DISCONTINUED] pregabalin (LYRICA) 75 MG capsule Take 1 capsule (75 mg total) by mouth 2 (two) times daily.  . meclizine (ANTIVERT) 25 MG tablet Take 1 tablet (25 mg total) by mouth 2 (two) times daily as needed for dizziness.   No facility-administered encounter medications on file as of 12/28/2019.    Surgical History: Past Surgical History:  Procedure Laterality Date  .  BREAST BIOPSY Left 1996   NEG  . CESAREAN SECTION    . CYSTOSCOPY/URETEROSCOPY/HOLMIUM LASER/STENT PLACEMENT Right 08/13/2018   Procedure: CYSTOSCOPY/URETEROSCOPY/HOLMIUM LASER/STENT PLACEMENT;  Surgeon: Hollice Espy, MD;  Location: [facility] ORS;  Service: Urology;  Laterality: Right;  . FOOT SURGERY Right    mortons neuroma  . TUBAL LIGATION    . UMBILICAL HERNIA REPAIR      Medical History: Past Medical History:  Diagnosis Date  . Anxiety   . Arthritis    neck  . Family history of adverse reaction to anesthesia    dad can only stay sedated for a certain amount of time per pt  . Foot drop, right foot    WEARS BRACE  .  GERD (gastroesophageal reflux disease)   . History of hiatal hernia   . History of kidney stones   . Peripheral neuropathy   . TMJ (dislocation of temporomandibular joint)     Family History: Family History  Problem Relation Age of Onset  . Cancer Mother   . Diabetes Father   . Hypertension Father   . Cancer Brother   . Breast cancer Neg Hx       Review of Systems  Constitutional: Positive for fatigue. Negative for activity change, appetite change, chills and unexpected weight change.  HENT: Negative for congestion, postnasal drip, rhinorrhea, sneezing and sore throat.   Respiratory: Negative for cough, chest tightness, shortness of breath and wheezing.   Cardiovascular: Negative for chest pain and palpitations.  Gastrointestinal: Negative for abdominal pain, constipation, diarrhea, nausea and vomiting.  Endocrine: Negative for cold intolerance, heat intolerance, polydipsia and polyuria.  Genitourinary: Negative for dysuria and frequency.  Musculoskeletal: Positive for arthralgias, back pain, myalgias and neck pain. Negative for joint swelling.  Skin: Negative for rash.  Allergic/Immunologic: Negative for environmental allergies.  Neurological: Positive for dizziness, weakness and numbness. Negative for tremors and facial asymmetry.  Hematological: Negative for adenopathy. Does not bruise/bleed easily.  Psychiatric/Behavioral: Negative for behavioral problems (Depression), sleep disturbance and suicidal ideas. The patient is nervous/anxious.        States that she is a little more depressed recently.      Today's Vitals   12/28/19 1406  BP: 125/79  Pulse: 76  Resp: 16  SpO2: 90%  Weight: 161 lb 1.3 oz (73.1 kg)  Height: 5\' 5"  (1.651 m)   Body mass index is 26.81 kg/m.  Physical Exam Vitals and nursing note reviewed.  Constitutional:      General: She is not in acute distress.    Appearance: Normal appearance. She is well-developed. She is not diaphoretic.  HENT:       Head: Normocephalic and atraumatic.     Nose: Nose normal.     Mouth/Throat:     Pharynx: No oropharyngeal exudate.  Eyes:     Extraocular Movements: Extraocular movements intact.     Conjunctiva/sclera: Conjunctivae normal.     Pupils: Pupils are equal, round, and reactive to light.  Neck:     Thyroid: No thyromegaly.     Vascular: No carotid bruit or JVD.     Trachea: No tracheal tenderness or tracheal deviation.  Cardiovascular:     Rate and Rhythm: Normal rate and regular rhythm.     Pulses: Normal pulses.     Heart sounds: Normal heart sounds. No murmur. No friction rub. No gallop.   Pulmonary:     Effort: Pulmonary effort is normal. No respiratory distress.     Breath sounds: Normal breath sounds. No  wheezing.  Chest:     Chest wall: No tenderness.     Breasts:        Right: Normal. No swelling, bleeding, inverted nipple, mass, nipple discharge, skin change or tenderness.        Left: Normal. No swelling, bleeding, inverted nipple, mass, nipple discharge, skin change or tenderness.  Abdominal:     General: Bowel sounds are normal.     Palpations: Abdomen is soft.     Tenderness: There is no abdominal tenderness.  Musculoskeletal:        General: Normal range of motion.     Cervical back: Normal range of motion and neck supple. Muscular tenderness present. No spinous process tenderness.       Back:  Lymphadenopathy:     Cervical: No cervical adenopathy.  Skin:    General: Skin is warm and dry.     Capillary Refill: Capillary refill takes less than 2 seconds.  Neurological:     General: No focal deficit present.     Mental Status: She is alert and oriented to person, place, and time. Mental status is at baseline.  Psychiatric:        Mood and Affect: Mood is anxious.        Behavior: Behavior normal.        Thought Content: Thought content normal.        Judgment: Judgment normal.    Assessment/Plan: 1. Encounter for general adult medical examination with  abnormal findings Annual health maintenance exam today  2. Vertigo Add meclizine 25mg  tablets which may be taken up to twice daily if needed for vertigo. Will refer back to neurology after she gets new health insurance.  - meclizine (ANTIVERT) 25 MG tablet; Take 1 tablet (25 mg total) by mouth 2 (two) times daily as needed for dizziness.  Dispense: 45 tablet; Refill: 2  3. Neuropathy Recently worsening. Increase lyrica to 100mg  twice daily.  - pregabalin (LYRICA) 100 MG capsule; Take 1 capsule (100 mg total) by mouth 2 (two) times daily.  Dispense: 60 capsule; Refill: 3 - nortriptyline (PAMELOR) 25 MG capsule; Take 1 to 2 capsules po QPM  Dispense: 60 capsule; Refill: 3  4. Anxiety disorder, unspecified type Increase nortriptyline 25mg  capsules, one to two capsules every evening. May continue alprazolam 0.5mg  twice daily as needed for acute anxiety. Refills provided today.  - ALPRAZolam (XANAX) 0.5 MG tablet; Take 1 tablet (0.5 mg total) by mouth 2 (two) times daily as needed. Take 1 tablet po BID prn anxiety  Dispense: 45 tablet; Refill: 3 - nortriptyline (PAMELOR) 25 MG capsule; Take 1 to 2 capsules po QPM  Dispense: 60 capsule; Refill: 3    General Counseling: joana sandbothe understanding of the findings of todays visit and agrees with plan of treatment. I have discussed any further diagnostic evaluation that may be needed or ordered today. We also reviewed her medications today. she has been encouraged to call the office with any questions or concerns that should arise related to todays visit.    Counseling:  This patient was seen by [place] with Dr Lavera Guise as a part of collaborative care agreement  Meds ordered this encounter  Medications  . meclizine (ANTIVERT) 25 MG tablet    Sig: Take 1 tablet (25 mg total) by mouth 2 (two) times daily as needed for dizziness.    Dispense:  45 tablet    Refill:  2    Order Specific Question:   Supervising  Provider    Answer:   Lavera Guise [id number]  . ALPRAZolam (XANAX) 0.5 MG tablet    Sig: Take 1 tablet (0.5 mg total) by mouth 2 (two) times daily as needed. Take 1 tablet po BID prn anxiety    Dispense:  45 tablet    Refill:  3    Order Specific Question:   Supervising Provider    Answer:   Lavera Guise [id number]  . pregabalin (LYRICA) 100 MG capsule    Sig: Take 1 capsule (100 mg total) by mouth 2 (two) times daily.    Dispense:  60 capsule    Refill:  3    Order Specific Question:   Supervising Provider    Answer:   Lavera Guise [id number]  . nortriptyline (PAMELOR) 25 MG capsule    Sig: Take 1 to 2 capsules po QPM    Dispense:  60 capsule    Refill:  3    Order Specific Question:   Supervising Provider    Answer:   Lavera Guise [id number]    Time spent: [place], MD  Internal Medicine

## 2020-01-02 DIAGNOSIS — Z0001 Encounter for general adult medical examination with abnormal findings: Secondary | ICD-10-CM | POA: Insufficient documentation

## 2020-01-02 DIAGNOSIS — R42 Dizziness and giddiness: Secondary | ICD-10-CM | POA: Insufficient documentation

## 2020-02-11 ENCOUNTER — Telehealth: Payer: Self-pay

## 2020-02-11 NOTE — Telephone Encounter (Signed)
COMPLETED RECORD REQUEST AND FAXED RECORDS TO [facility].

## 2020-03-22 ENCOUNTER — Other Ambulatory Visit: Payer: Self-pay

## 2020-03-22 DIAGNOSIS — G629 Polyneuropathy, unspecified: Secondary | ICD-10-CM

## 2020-03-22 DIAGNOSIS — F419 Anxiety disorder, unspecified: Secondary | ICD-10-CM

## 2020-03-22 MED ORDER — NORTRIPTYLINE HCL 25 MG PO CAPS: ORAL_CAPSULE | ORAL | 3 refills | Status: DC

## 2020-04-26 ENCOUNTER — Telehealth: Payer: Self-pay

## 2020-04-26 NOTE — Telephone Encounter (Signed)
Confirmed and screened for 04-28-20 ov. 

## 2020-04-28 ENCOUNTER — Encounter: Payer: Self-pay | Admitting: Nurse Practitioner

## 2020-04-28 ENCOUNTER — Ambulatory Visit: Payer: Medicaid Other | Admitting: Nurse Practitioner

## 2020-04-28 ENCOUNTER — Other Ambulatory Visit: Payer: Self-pay

## 2020-04-28 VITALS — BP 118/66 | HR 71 | Temp 97.5°F | Resp 16 | Ht 65.0 in | Wt 167.0 lb

## 2020-04-28 DIAGNOSIS — G500 Trigeminal neuralgia: Secondary | ICD-10-CM | POA: Diagnosis not present

## 2020-04-28 DIAGNOSIS — Z1231 Encounter for screening mammogram for malignant neoplasm of breast: Secondary | ICD-10-CM

## 2020-04-28 DIAGNOSIS — F419 Anxiety disorder, unspecified: Secondary | ICD-10-CM

## 2020-04-28 DIAGNOSIS — G629 Polyneuropathy, unspecified: Secondary | ICD-10-CM | POA: Diagnosis not present

## 2020-04-28 MED ORDER — DULOXETINE HCL 30 MG PO CPEP: 30.0000 mg | ORAL_CAPSULE | Freq: Every day | ORAL | 3 refills | Status: DC

## 2020-04-28 NOTE — Progress Notes (Signed)
[facility] [place], [place] [address]  Internal MEDICINE  Office Visit Note  Patient Name: Nicole Rich  [id number]  [id number]  Date of Service: 05/11/2020  Chief Complaint  Patient presents with  . Gastroesophageal Reflux  . Arthritis  . Anxiety  . Medication Management    lyrica caused weight gain would like to try something else     The patient is here for follow up visit. She states that she is concerned about recent weight gain. She has gained six pounds since her last visit here in 11/2020. She states that every pound she gains she feels more pain in her legs and feet. She does take lyrica which helps with the neuropathy and weakness she has in her legs and feet. She believes that weight gain is attributed to the lyrica. She states that in the past, she has taken cymbalta. She took it when she was going through a hard period of time in her life. She had three deaths in her family within one year. She stopped taking it when she recovered from the emotional trauma. The patient is due to have routine, fasting labs as well as screening mammogram.       Current Medication: Outpatient Encounter Medications as of 04/28/2020  Medication Sig  . ALPRAZolam (XANAX) 0.5 MG tablet Take 1 tablet (0.5 mg total) by mouth 2 (two) times daily as needed. Take 1 tablet po BID prn anxiety  . Cyanocobalamin (VITAMIN B 12 PO) Take 1 tablet by mouth daily.  . meloxicam (MOBIC) 15 MG tablet Take 1 tablet (15 mg total) by mouth daily.  . nortriptyline (PAMELOR) 25 MG capsule Take 1 to 2 capsules po QPM  . Omega-3 Fatty Acids (FISH OIL PO) Take 4,000 mg by mouth daily.  Marland Kitchen OVER THE COUNTER MEDICATION Take 2 capsules by mouth 2 (two) times daily.  Marland Kitchen oxyCODONE-acetaminophen (PERCOCET/ROXICET) 5-325 MG tablet Take 1 tablet by mouth every 6 (six) hours as needed for severe pain.  . tamsulosin (FLOMAX) 0.4 MG CAPS capsule Take 1 capsule (0.4 mg total) by mouth daily.  .  [DISCONTINUED] pregabalin (LYRICA) 100 MG capsule Take 1 capsule (100 mg total) by mouth 2 (two) times daily.  . DULoxetine (CYMBALTA) 30 MG capsule Take 1 capsule (30 mg total) by mouth daily.  . [DISCONTINUED] meclizine (ANTIVERT) 25 MG tablet Take 1 tablet (25 mg total) by mouth 2 (two) times daily as needed for dizziness. (Patient not taking: Reported on 04/28/2020)   No facility-administered encounter medications on file as of 04/28/2020.    Surgical History: Past Surgical History:  Procedure Laterality Date  . BREAST BIOPSY Left 1996   NEG  . CESAREAN SECTION    . CYSTOSCOPY/URETEROSCOPY/HOLMIUM LASER/STENT PLACEMENT Right 08/13/2018   Procedure: CYSTOSCOPY/URETEROSCOPY/HOLMIUM LASER/STENT PLACEMENT;  Surgeon: Hollice Espy, MD;  Location: [facility] ORS;  Service: Urology;  Laterality: Right;  . FOOT SURGERY Right    mortons neuroma  . TUBAL LIGATION    . UMBILICAL HERNIA REPAIR      Medical History: Past Medical History:  Diagnosis Date  . Anxiety   . Arthritis    neck  . Family history of adverse reaction to anesthesia    dad can only stay sedated for a certain amount of time per pt  . Foot drop, right foot    WEARS BRACE  . GERD (gastroesophageal reflux disease)   . History of hiatal hernia   . History of kidney stones   . Peripheral neuropathy   .  TMJ (dislocation of temporomandibular joint)     Family History: Family History  Problem Relation Age of Onset  . Cancer Mother   . Diabetes Father   . Hypertension Father   . Cancer Brother   . Breast cancer Neg Hx     Social History   Socioeconomic History  . Marital status: Married    Spouse name: Not on file  . Number of children: Not on file  . Years of education: Not on file  . Highest education level: Not on file  Occupational History  . Not on file  Tobacco Use  . Smoking status: Former Smoker    Packs/day: 0.50    Years: 40.00    Pack years: 20.00    Types: Cigarettes    Quit date: 08/06/2016     Years since quitting: 3.7  . Smokeless tobacco: Never Used  Substance and Sexual Activity  . Alcohol use: No  . Drug use: No  . Sexual activity: Yes  Other Topics Concern  . Not on file  Social History Narrative  . Not on file   Social Determinants of Health   Financial Resource Strain:   . Difficulty of Paying Living Expenses:   Food Insecurity:   . Worried About Charity fundraiser in the Last Year:   . Arboriculturist in the Last Year:   Transportation Needs:   . Film/video editor (Medical):   Marland Kitchen Lack of Transportation (Non-Medical):   Physical Activity:   . Days of Exercise per Week:   . Minutes of Exercise per Session:   Stress:   . Feeling of Stress :   Social Connections:   . Frequency of Communication with Friends and Family:   . Frequency of Social Gatherings with Friends and Family:   . Attends Religious Services:   . Active Member of Clubs or Organizations:   . Attends Archivist Meetings:   Marland Kitchen Marital Status:   Intimate Partner Violence:   . Fear of Current or Ex-Partner:   . Emotionally Abused:   Marland Kitchen Physically Abused:   . Sexually Abused:       Review of Systems  Constitutional: Positive for fatigue. Negative for activity change, appetite change, chills and unexpected weight change.  HENT: Negative for congestion, postnasal drip, rhinorrhea, sneezing and sore throat.   Respiratory: Negative for cough, chest tightness, shortness of breath and wheezing.   Cardiovascular: Negative for chest pain and palpitations.  Gastrointestinal: Negative for abdominal pain, constipation, diarrhea, nausea and vomiting.  Endocrine: Negative for cold intolerance, heat intolerance, polydipsia and polyuria.  Genitourinary: Negative for dysuria and frequency.  Musculoskeletal: Positive for arthralgias, back pain, myalgias and neck pain. Negative for joint swelling.       Weakness in legs and feet.   Skin: Negative for rash.  Allergic/Immunologic: Negative for  environmental allergies.  Neurological: Positive for dizziness, weakness and numbness. Negative for tremors and facial asymmetry.  Hematological: Negative for adenopathy. Does not bruise/bleed easily.  Psychiatric/Behavioral: Negative for behavioral problems (Depression), sleep disturbance and suicidal ideas. The patient is nervous/anxious.        Depression improving and stable.     Today's Vitals   04/28/20 1342  BP: 118/66  Pulse: 71  Resp: 16  Temp: (!) 97.5 F (36.4 C)  SpO2: 99%  Weight: 167 lb (75.8 kg)  Height: 5\' 5"  (1.651 m)   Body mass index is 27.79 kg/m.  Physical Exam Vitals and nursing note reviewed.  Constitutional:  General: She is not in acute distress.    Appearance: Normal appearance. She is well-developed. She is not diaphoretic.  HENT:     Head: Normocephalic and atraumatic.     Mouth/Throat:     Pharynx: No oropharyngeal exudate.  Eyes:     Pupils: Pupils are equal, round, and reactive to light.  Neck:     Thyroid: No thyromegaly.     Vascular: No carotid bruit or JVD.     Trachea: No tracheal deviation.  Cardiovascular:     Rate and Rhythm: Normal rate and regular rhythm.     Heart sounds: Normal heart sounds. No murmur. No friction rub. No gallop.   Pulmonary:     Effort: Pulmonary effort is normal. No respiratory distress.     Breath sounds: Normal breath sounds. No wheezing or rales.  Chest:     Chest wall: No tenderness.  Abdominal:     Palpations: Abdomen is soft.  Musculoskeletal:        General: Normal range of motion.     Cervical back: Normal range of motion and neck supple.     Comments: There is weakness of both legs. Abnormal gait is noted.   Lymphadenopathy:     Cervical: No cervical adenopathy.  Skin:    General: Skin is warm and dry.  Neurological:     Mental Status: She is alert and oriented to person, place, and time. Mental status is at baseline.     Cranial Nerves: No cranial nerve deficit.  Psychiatric:         Attention and Perception: Attention and perception normal.        Mood and Affect: Mood is depressed.        Speech: Speech normal.        Behavior: Behavior normal.        Thought Content: Thought content normal.        Cognition and Memory: Cognition and memory normal.        Judgment: Judgment normal.    Assessment/Plan: 1. Trigeminal neuralgia Stable. Continue nortriptyline as prescribed   2. Neuropathy Restart duloxetine 30mg  daily. Reassess at next visit.  - DULoxetine (CYMBALTA) 30 MG capsule; Take 1 capsule (30 mg total) by mouth daily.  Dispense: 30 capsule; Refill: 3  3. Anxiety disorder, unspecified type Restart duloxetine 30mg  daily. Reassess at next visit.   4. Encounter for screening mammogram for malignant neoplasm of breast - MM DIGITAL SCREENING BILATERAL; Future  General Counseling: milarose gilster understanding of the findings of todays visit and agrees with plan of treatment. I have discussed any further diagnostic evaluation that may be needed or ordered today. We also reviewed her medications today. she has been encouraged to call the office with any questions or concerns that should arise related to todays visit.  This patient was seen by Leretha Pol FNP Collaboration with Dr Lavera Guise as a part of collaborative care agreement  Orders Placed This Encounter  Procedures  . MM DIGITAL SCREENING BILATERAL    Meds ordered this encounter  Medications  . DULoxetine (CYMBALTA) 30 MG capsule    Sig: Take 1 capsule (30 mg total) by mouth daily.    Dispense:  30 capsule    Refill:  3    Please d/c lyrica. Switching to cymbala.    Order Specific Question:   Supervising Provider    Answer:   Lavera Guise [id number]    Total time spent: 30 Minutes   Time spent  includes review of chart, medications, test results, and follow up plan with the patient.      Dr Lavera Guise Internal medicine

## 2020-05-04 ENCOUNTER — Other Ambulatory Visit: Payer: Self-pay | Admitting: Nurse Practitioner

## 2020-05-05 LAB — COMPREHENSIVE METABOLIC PANEL
ALT: 11 IU/L (ref 0–32)
AST: 18 IU/L (ref 0–40)
Albumin/Globulin Ratio: 2 (ref 1.2–2.2)
Albumin: 4.5 g/dL (ref 3.8–4.9)
Alkaline Phosphatase: 88 IU/L (ref 39–117)
BUN/Creatinine Ratio: 20 (ref 9–23)
BUN: 14 mg/dL (ref 6–24)
Bilirubin Total: 0.6 mg/dL (ref 0.0–1.2)
CO2: 21 mmol/L (ref 20–29)
Calcium: 9.5 mg/dL (ref 8.7–10.2)
Chloride: 103 mmol/L (ref 96–106)
Creatinine, Ser: 0.7 mg/dL (ref 0.57–1.00)
GFR calc Af Amer: 110 mL/min/{1.73_m2} (ref >59)
GFR calc non Af Amer: 96 mL/min/{1.73_m2} (ref >59)
Globulin, Total: 2.2 g/dL (ref 1.5–4.5)
Glucose: 84 mg/dL (ref 65–99)
Potassium: 4.2 mmol/L (ref 3.5–5.2)
Sodium: 136 mmol/L (ref 134–144)
Total Protein: 6.7 g/dL (ref 6.0–8.5)

## 2020-05-05 LAB — T4, FREE: Free T4: 1.16 ng/dL (ref 0.82–1.77)

## 2020-05-05 LAB — HEPATITIS C ANTIBODY (REFLEX): HCV Ab: <0.1 s/co ratio (ref 0.0–0.9)

## 2020-05-05 LAB — CBC
Hematocrit: 41.1 % (ref 34.0–46.6)
Hemoglobin: 14.2 g/dL (ref 11.1–15.9)
MCH: 29.9 pg (ref 26.6–33.0)
MCHC: 34.5 g/dL (ref 31.5–35.7)
MCV: 87 fL (ref 79–97)
Platelets: 281 10*3/uL (ref 150–450)
RBC: 4.75 x10E6/uL (ref 3.77–5.28)
RDW: 12 % (ref 11.7–15.4)
WBC: 7.9 10*3/uL (ref 3.4–10.8)

## 2020-05-05 LAB — LIPID PANEL WITH LDL/HDL RATIO
Cholesterol, Total: 225 mg/dL — ABNORMAL HIGH (ref 100–199)
HDL: 62 mg/dL (ref >39)
LDL Chol Calc (NIH): 143 mg/dL — ABNORMAL HIGH (ref 0–99)
LDL/HDL Ratio: 2.3 ratio (ref 0.0–3.2)
Triglycerides: 111 mg/dL (ref 0–149)
VLDL Cholesterol Cal: 20 mg/dL (ref 5–40)

## 2020-05-05 LAB — HCV COMMENT:

## 2020-05-05 LAB — VITAMIN D 25 HYDROXY (VIT D DEFICIENCY, FRACTURES): Vit D, 25-Hydroxy: 30.6 ng/mL (ref 30.0–100.0)

## 2020-05-05 LAB — TSH: TSH: 1.88 u[IU]/mL (ref 0.450–4.500)

## 2020-05-31 ENCOUNTER — Other Ambulatory Visit: Payer: Self-pay | Admitting: Nurse Practitioner

## 2020-05-31 ENCOUNTER — Telehealth: Payer: Self-pay

## 2020-05-31 DIAGNOSIS — F419 Anxiety disorder, unspecified: Secondary | ICD-10-CM

## 2020-05-31 MED ORDER — ALPRAZOLAM 0.5 MG PO TABS: 0.5000 mg | ORAL_TABLET | Freq: Two times a day (BID) | ORAL | 3 refills | Status: DC | PRN

## 2020-05-31 NOTE — Telephone Encounter (Signed)
New prescription for alprazolam sent to [facility].

## 2020-05-31 NOTE — Progress Notes (Signed)
Mildly elevated LDL and total cholesterol. Discuss with patient at next visit.

## 2020-05-31 NOTE — Progress Notes (Signed)
New prescription for alprazolam sent to [facility].

## 2020-07-18 ENCOUNTER — Other Ambulatory Visit: Payer: Self-pay

## 2020-07-18 ENCOUNTER — Telehealth: Payer: Self-pay

## 2020-07-18 ENCOUNTER — Ambulatory Visit: Payer: Medicaid Other | Admitting: Nurse Practitioner

## 2020-07-18 ENCOUNTER — Encounter: Payer: Self-pay | Admitting: Nurse Practitioner

## 2020-07-18 VITALS — BP 130/84 | HR 74 | Temp 97.4°F | Resp 16 | Ht 65.0 in | Wt 160.2 lb

## 2020-07-18 DIAGNOSIS — G500 Trigeminal neuralgia: Secondary | ICD-10-CM | POA: Diagnosis not present

## 2020-07-18 MED ORDER — PREDNISONE 10 MG (21) PO TBPK: ORAL_TABLET | ORAL | 0 refills | Status: DC

## 2020-07-18 MED ORDER — VALACYCLOVIR HCL 1 G PO TABS: 1000.0000 mg | ORAL_TABLET | Freq: Two times a day (BID) | ORAL | 0 refills | Status: DC

## 2020-07-18 MED ORDER — PREGABALIN 50 MG PO CAPS: 50.0000 mg | ORAL_CAPSULE | Freq: Three times a day (TID) | ORAL | 1 refills | Status: DC

## 2020-07-18 NOTE — Progress Notes (Signed)
[facility] [place], [place] [address]  Internal MEDICINE  Office Visit Note  Patient Name: Nicole Rich  [id number]  [id number]  Date of Service: 08/10/2020   Pt is here for a sick visit.  Chief Complaint  Patient presents with  . Acute Visit    trigeminal neuralgia on left side   . Gastroesophageal Reflux     The patient presents for acute visit. States that she has symptoms of trigeminal neuralgia on left side of her face. States that symptoms started about four days ago. States that pain starts as a cramp type sensation in left jaw and cheek. Then this progresses to sharp pain in the ear then along the lower jaw line. States that left eye feels numb. States that this is different than flares on right side of her face and jaw. On right side, pain and "attacks" are intermittent. There is pause in between the attacks. The pain on the left side is constant. She denies weakness on the left side of her face. Eye or mouth are not drooping. She denies slurred speech. She denies dysphagia. Though, she states that her appetite is decreased due to the pain. She states that when she has had attack on the right side in the past, her neurologist would put her on tegretol which would be the only thing that would help.        Current Medication:  Outpatient Encounter Medications as of 07/18/2020  Medication Sig  . ALPRAZolam (XANAX) 0.5 MG tablet Take 1 tablet (0.5 mg total) by mouth 2 (two) times daily as needed. Take 1 tablet po BID prn anxiety  . Cyanocobalamin (VITAMIN B 12 PO) Take 1 tablet by mouth daily.  . DULoxetine (CYMBALTA) 30 MG capsule Take 1 capsule (30 mg total) by mouth daily.  . meloxicam (MOBIC) 15 MG tablet Take 1 tablet (15 mg total) by mouth daily.  . nortriptyline (PAMELOR) 25 MG capsule Take 1 to 2 capsules po QPM  . Omega-3 Fatty Acids (FISH OIL PO) Take 4,000 mg by mouth daily.  Marland Kitchen OVER THE COUNTER MEDICATION Take 2 capsules by mouth 2  (two) times daily.  Marland Kitchen oxyCODONE-acetaminophen (PERCOCET/ROXICET) 5-325 MG tablet Take 1 tablet by mouth every 6 (six) hours as needed for severe pain.  . tamsulosin (FLOMAX) 0.4 MG CAPS capsule Take 1 capsule (0.4 mg total) by mouth daily.  . predniSONE (STERAPRED UNI-PAK 21 TAB) 10 MG (21) TBPK tablet 6 day taper - take by mouth as directed for 6 days  . pregabalin (LYRICA) 50 MG capsule Take 1 capsule (50 mg total) by mouth 3 (three) times daily.  . valACYclovir (VALTREX) 1000 MG tablet Take 1 tablet (1,000 mg total) by mouth 2 (two) times daily.   No facility-administered encounter medications on file as of 07/18/2020.      Medical History: Past Medical History:  Diagnosis Date  . Anxiety   . Arthritis    neck  . Family history of adverse reaction to anesthesia    dad can only stay sedated for a certain amount of time per pt  . Foot drop, right foot    WEARS BRACE  . GERD (gastroesophageal reflux disease)   . History of hiatal hernia   . History of kidney stones   . Peripheral neuropathy   . TMJ (dislocation of temporomandibular joint)      Today's Vitals   07/18/20 1504  BP: 130/84  Pulse: 74  Resp: 16  Temp: (!) 97.4  F (36.3 C)  SpO2: 96%  Weight: 160 lb 3.2 oz (72.7 kg)  Height: 5\' 5"  (1.651 m)   Body mass index is 26.66 kg/m.  Review of Systems  Constitutional: Positive for fatigue. Negative for chills and unexpected weight change.  HENT: Positive for ear pain and facial swelling. Negative for congestion, postnasal drip, rhinorrhea, sneezing and sore throat.        Facial pain on left side of the face and radiating up to the left jaw and left outer ear.   Respiratory: Negative for cough, chest tightness, shortness of breath and wheezing.   Cardiovascular: Negative for chest pain and palpitations.  Gastrointestinal: Negative for abdominal pain, constipation, diarrhea, nausea and vomiting.  Endocrine: Negative for cold intolerance, heat intolerance, polydipsia  and polyuria.  Musculoskeletal: Negative for arthralgias, back pain, joint swelling and neck pain.  Skin: Negative for rash.  Neurological: Negative.  Negative for tremors and numbness.  Hematological: Negative for adenopathy. Does not bruise/bleed easily.  Psychiatric/Behavioral: Negative for behavioral problems (Depression), sleep disturbance and suicidal ideas. The patient is not nervous/anxious.     Physical Exam Vitals and nursing note reviewed.  Constitutional:      General: She is not in acute distress.    Appearance: Normal appearance. She is well-developed. She is ill-appearing. She is not diaphoretic.  HENT:     Head: Normocephalic and atraumatic.     Jaw: Tenderness and pain on movement present.     Salivary Glands: Right salivary gland is not diffusely enlarged or tender. Left salivary gland is not diffusely enlarged or tender.     Comments: Tenderness of left jaw and left cheekbone with palpation.     Right Ear: Ear canal and external ear normal.     Left Ear: Ear canal normal. Tenderness present. There is mastoid tenderness.     Ears:     Comments: Tenderness with palpation of the preauricular area of left side of face.     Mouth/Throat:     Pharynx: No oropharyngeal exudate.  Eyes:     Pupils: Pupils are equal, round, and reactive to light.  Neck:     Thyroid: No thyromegaly.     Vascular: No JVD.     Trachea: No tracheal deviation.  Cardiovascular:     Rate and Rhythm: Normal rate and regular rhythm.     Heart sounds: Normal heart sounds. No murmur heard.  No friction rub. No gallop.   Pulmonary:     Effort: Pulmonary effort is normal. No respiratory distress.     Breath sounds: Normal breath sounds. No wheezing or rales.  Chest:     Chest wall: No tenderness.  Abdominal:     General: Bowel sounds are normal.     Palpations: Abdomen is soft.  Musculoskeletal:        General: Normal range of motion.     Cervical back: Normal range of motion and neck supple.   Lymphadenopathy:     Cervical: No cervical adenopathy.  Skin:    General: Skin is warm and dry.  Neurological:     General: No focal deficit present.     Mental Status: She is alert and oriented to person, place, and time.     Cranial Nerves: No cranial nerve deficit.  Psychiatric:        Mood and Affect: Mood normal.        Behavior: Behavior normal.        Thought Content: Thought content normal.  Judgment: Judgment normal.    Assessment/Plan: 1. Trigeminal neuralgia of left side of face Restart lyrica 50mg  which may be taken up to three times daily to reduce nerve type pain. Start valacyclovir 1000mg  twice daily for 10 days. Add prednisone taper. Take as directed for 6 days. - predniSONE (STERAPRED UNI-PAK 21 TAB) 10 MG (21) TBPK tablet; 6 day taper - take by mouth as directed for 6 days  Dispense: 21 tablet; Refill: 0 - valACYclovir (VALTREX) 1000 MG tablet; Take 1 tablet (1,000 mg total) by mouth 2 (two) times daily.  Dispense: 20 tablet; Refill: 0 - pregabalin (LYRICA) 50 MG capsule; Take 1 capsule (50 mg total) by mouth 3 (three) times daily.  Dispense: 90 capsule; Refill: 1  General Counseling: annalisse minkoff understanding of the findings of todays visit and agrees with plan of treatment. I have discussed any further diagnostic evaluation that may be needed or ordered today. We also reviewed her medications today. she has been encouraged to call the office with any questions or concerns that should arise related to todays visit.    Counseling:   This patient was seen by [place] with Dr Lavera Guise as a part of collaborative care agreement  Meds ordered this encounter  Medications  . predniSONE (STERAPRED UNI-PAK 21 TAB) 10 MG (21) TBPK tablet    Sig: 6 day taper - take by mouth as directed for 6 days    Dispense:  21 tablet    Refill:  0    Order Specific Question:   Supervising Provider    Answer:   Lavera Guise [place]  .  valACYclovir (VALTREX) 1000 MG tablet    Sig: Take 1 tablet (1,000 mg total) by mouth 2 (two) times daily.    Dispense:  20 tablet    Refill:  0    Order Specific Question:   Supervising Provider    Answer:   Lavera Guise [[id number]]  . pregabalin (LYRICA) 50 MG capsule    Sig: Take 1 capsule (50 mg total) by mouth 3 (three) times daily.    Dispense:  90 capsule    Refill:  1    Order Specific Question:   Supervising Provider    Answer:   Lavera Guise [[id number]]    Time spent: 20 Minutes

## 2020-07-18 NOTE — Telephone Encounter (Signed)
Confirmed appointment on 07/18/2020 and screened for covid. [facility]

## 2020-07-22 ENCOUNTER — Emergency Department
Admission: EM | Admit: 2020-07-22 | Discharge: 2020-07-22 | Disposition: A | Discharge status: Home / Self Care | Payer: Medicaid Other | Attending: Emergency Medicine | Admitting: Emergency Medicine

## 2020-07-22 ENCOUNTER — Other Ambulatory Visit: Payer: Self-pay

## 2020-07-22 DIAGNOSIS — Z87891 Personal history of nicotine dependence: Secondary | ICD-10-CM | POA: Insufficient documentation

## 2020-07-22 DIAGNOSIS — G500 Trigeminal neuralgia: Secondary | ICD-10-CM

## 2020-07-22 DIAGNOSIS — G40009 Localization-related (focal) (partial) idiopathic epilepsy and epileptic syndromes with seizures of localized onset, not intractable, without status epilepticus: Secondary | ICD-10-CM | POA: Insufficient documentation

## 2020-07-22 MED ORDER — CARBAMAZEPINE 200 MG PO TABS: 200.0000 mg | ORAL_TABLET | Freq: Two times a day (BID) | ORAL | 2 refills | Status: DC

## 2020-07-22 MED ORDER — CARBAMAZEPINE 200 MG PO TABS
200.0000 mg | ORAL_TABLET | Freq: Once | ORAL | Status: AC
  Administered 2020-07-22: 200 mg via ORAL
  Filled 2020-07-22: qty 1

## 2020-07-22 NOTE — ED Triage Notes (Signed)
Patient c/o jaw pain radiating to eyes and throat. Patient reports hx of trigeminal neuralgia. Patient reports pain is more severe than normal. Patient tearful. Patient has taken lyrica at 1500, nortriptyline at 1600, and valtrex at 1500. Patient reports temporary relief of symptoms after medication but pain has returned. Patient reports tegretol works for her pain but she was taken off by PCP.

## 2020-07-22 NOTE — Discharge Instructions (Signed)
Take Tegretol twice daily for the next seven days.

## 2020-07-22 NOTE — ED Provider Notes (Signed)
Emergency Department Provider Note  ____________________________________________  Time seen: Approximately 10:51 PM  I have reviewed the triage vital signs and the nursing notes.   HISTORY  Chief Complaint Jaw Pain   Historian Patient   HPI Nicole Rich is a 59 y.o. female with a history of trigeminal neuralgia, presents to the emergency department with severe left facial pain.  Patient states that the only thing that works for her trigeminal neuralgia is Tegretol.  She reports that several months ago, her neurologist took her off of Tegretol in place of nortriptyline and she states that nortriptyline has not helped her symptoms at all.  She states that she was taken off of Tegretol because neurology was apprehensive that it would increase her risk of falling as she has dropfoot on the right.  Patient is requesting Tegretol in the ED.   Past Medical History:  Diagnosis Date  . Anxiety   . Arthritis    neck  . Family history of adverse reaction to anesthesia    dad can only stay sedated for a certain amount of time per pt  . Foot drop, right foot    WEARS BRACE  . GERD (gastroesophageal reflux disease)   . History of hiatal hernia   . History of kidney stones   . Peripheral neuropathy   . TMJ (dislocation of temporomandibular joint)      Immunizations up to date:  Yes.     Past Medical History:  Diagnosis Date  . Anxiety   . Arthritis    neck  . Family history of adverse reaction to anesthesia    dad can only stay sedated for a certain amount of time per pt  . Foot drop, right foot    WEARS BRACE  . GERD (gastroesophageal reflux disease)   . History of hiatal hernia   . History of kidney stones   . Peripheral neuropathy   . TMJ (dislocation of temporomandibular joint)     Patient Active Problem List   Diagnosis Date Noted  . Encounter for general adult medical examination with abnormal findings 01/02/2020  . Vertigo 01/02/2020  . Intervertebral disc  disorder with radiculopathy of lumbar region 12/29/2018  . Screening for breast cancer 12/28/2018  . Vitamin D deficiency 12/28/2018  . Dysuria 12/28/2018  . Gastroesophageal reflux disease 01/28/2018  . Anxiety disorder 01/28/2018  . Neuropathy 12/12/2017  . Other fatigue 12/12/2017  . Trigeminal neuralgia 07/31/2016  . Numbness and tingling 01/04/2016  . Pain of upper extremity 01/04/2016  . Limb weakness 01/04/2016    Past Surgical History:  Procedure Laterality Date  . BREAST BIOPSY Left 1996   NEG  . CESAREAN SECTION    . CYSTOSCOPY/URETEROSCOPY/HOLMIUM LASER/STENT PLACEMENT Right 08/13/2018   Procedure: CYSTOSCOPY/URETEROSCOPY/HOLMIUM LASER/STENT PLACEMENT;  Surgeon: Hollice Espy, MD;  Location: [facility] ORS;  Service: Urology;  Laterality: Right;  . FOOT SURGERY Right    mortons neuroma  . TUBAL LIGATION    . UMBILICAL HERNIA REPAIR      Prior to Admission medications   Medication Sig Start Date End Date Taking? Authorizing Provider  ALPRAZolam Duanne Moron) 0.5 MG tablet Take 1 tablet (0.5 mg total) by mouth 2 (two) times daily as needed. Take 1 tablet po BID prn anxiety 05/31/20   Ronnell Freshwater, NP  carbamazepine (TEGRETOL) 200 MG tablet Take 1 tablet (200 mg total) by mouth 2 (two) times daily for 7 days. 07/22/20 07/29/20  Lannie Fields, PA-C  Cyanocobalamin (VITAMIN B 12 PO) Take 1 tablet  by mouth daily.    [provider]  DULoxetine (CYMBALTA) 30 MG capsule Take 1 capsule (30 mg total) by mouth daily. 04/28/20   Ronnell Freshwater, NP  meloxicam (MOBIC) 15 MG tablet Take 1 tablet (15 mg total) by mouth daily. 08/28/19   Cuthriell, Charline Bills, PA-C  nortriptyline (PAMELOR) 25 MG capsule Take 1 to 2 capsules po QPM 03/22/20   Ronnell Freshwater, NP  Omega-3 Fatty Acids (FISH OIL PO) Take 4,000 mg by mouth daily.    [provider]  OVER THE COUNTER MEDICATION Take 2 capsules by mouth 2 (two) times daily.    [provider]  oxyCODONE-acetaminophen  (PERCOCET/ROXICET) 5-325 MG tablet Take 1 tablet by mouth every 6 (six) hours as needed for severe pain. 08/28/19   Cuthriell, Charline Bills, PA-C  predniSONE (STERAPRED UNI-PAK 21 TAB) 10 MG (21) TBPK tablet 6 day taper - take by mouth as directed for 6 days 07/18/20   Ronnell Freshwater, NP  pregabalin (LYRICA) 50 MG capsule Take 1 capsule (50 mg total) by mouth 3 (three) times daily. 07/18/20   Ronnell Freshwater, NP  tamsulosin (FLOMAX) 0.4 MG CAPS capsule Take 1 capsule (0.4 mg total) by mouth daily. 08/13/18   Hollice Espy, MD  valACYclovir (VALTREX) 1000 MG tablet Take 1 tablet (1,000 mg total) by mouth 2 (two) times daily. 07/18/20   Ronnell Freshwater, NP    Allergies Patient has no known allergies.  Family History  Problem Relation Age of Onset  . Cancer Mother   . Diabetes Father   . Hypertension Father   . Cancer Brother   . Breast cancer Neg Hx     Social History Social History   Tobacco Use  . Smoking status: Former Smoker    Packs/day: 0.50    Years: 40.00    Pack years: 20.00    Types: Cigarettes    Quit date: 08/06/2016    Years since quitting: 3.9  . Smokeless tobacco: Never Used  Vaping Use  . Vaping Use: Some days  Substance Use Topics  . Alcohol use: No  . Drug use: No     Review of Systems  Constitutional: No fever/chills Eyes:  No discharge ENT: No upper respiratory complaints. Respiratory: no cough. No SOB/ use of accessory muscles to breath Gastrointestinal:   No nausea, no vomiting.  No diarrhea.  No constipation. Musculoskeletal: Negative for musculoskeletal pain. Skin: Negative for rash, abrasions, lacerations, ecchymosis.    ____________________________________________   PHYSICAL EXAM:  VITAL SIGNS: ED Triage Vitals  Enc Vitals Group     BP 07/22/20 2058 (!) 148/83     Pulse Rate 07/22/20 2058 83     Resp 07/22/20 2058 22     Temp 07/22/20 2058 98.1 F (36.7 C)     Temp Source 07/22/20 2058 Oral     SpO2 07/22/20 2058 99 %     Weight  07/22/20 2059 156 lb (70.8 kg)     Height 07/22/20 2059 5\' 5"  (1.651 m)     Head Circumference --      Peak Flow --      Pain Score --      Pain Loc --      Pain Edu? --      Excl. in [place]? --      Constitutional: Alert and oriented.  Patient is crying in exam room. Eyes: Conjunctivae are normal. PERRL. EOMI. patient has ptosis along right upper eyelid. Head: Atraumatic. ENT:  Nose: No congestion/rhinnorhea.      Mouth/Throat: Mucous membranes are moist.  Neck: No stridor. Full range of motion.  Cardiovascular: Normal rate, regular rhythm. Normal S1 and S2.  Good peripheral circulation. Respiratory: Normal respiratory effort without tachypnea or retractions. Lungs CTAB. Good air entry to the bases with no decreased or absent breath sounds Gastrointestinal: Bowel sounds x 4 quadrants. Soft and nontender to palpation. No guarding or rigidity. No distention. Musculoskeletal: Full range of motion to all extremities. No obvious deformities noted Neurologic:  Normal for age. No gross focal neurologic deficits are appreciated.  Skin:  Skin is warm, dry and intact. No rash noted. Psychiatric: Mood and affect are normal for age. Speech and behavior are normal.   ____________________________________________   LABS (all labs ordered are listed, but only abnormal results are displayed)  Labs Reviewed - No data to display ____________________________________________  EKG   ____________________________________________  RADIOLOGY   No results found.  ____________________________________________    PROCEDURES  Procedure(s) performed:     Procedures     Medications  carbamazepine (TEGRETOL) tablet 200 mg (200 mg Oral Given 07/22/20 2205)     ____________________________________________   INITIAL IMPRESSION / ASSESSMENT AND PLAN / ED COURSE  Pertinent labs & imaging results that were available during my care of the patient were reviewed by me and considered in my  medical decision making (see chart for details).      Assessment and plan Trigeminal neuralgia 59 year old female presents to the emergency department with concern for worsening pain from trigeminal neuralgia.  Patient was mildly hypertensive at triage but vital signs were otherwise reassuring.  I discussed patient's case with attending Dr. Cherylann Banas.  We reviewed patient's history and did not see a specific note from neurology discontinuing Tegretol.  We agreed to treat patient with Tegretol at 200 mg twice daily for the next 7 days until patient can be reevaluated by neurology.  Patient was given Tegretol in the emergency department tonight she reported that her pain improved significantly.  Patient was able to rest comfortably after Tegretol was administered.  I cautioned patient that she should discontinue nortriptyline.  She voiced understanding.  Return precautions were given to return with new or worsening symptoms.   ____________________________________________  FINAL CLINICAL IMPRESSION(S) / ED DIAGNOSES  Final diagnoses:  Trigeminal neuralgia      NEW MEDICATIONS STARTED DURING THIS VISIT:  ED Discharge Orders         Ordered    carbamazepine (TEGRETOL) 200 MG tablet  2 times daily     Discontinue  Reprint     07/22/20 2248              This chart was dictated using voice recognition software/Dragon. Despite best efforts to proofread, errors can occur which can change the meaning. Any change was purely unintentional.     Lannie Fields, PA-C 07/22/20 2256    Arta Silence, MD 07/22/20 2340

## 2020-07-26 ENCOUNTER — Telehealth: Payer: Self-pay

## 2020-07-26 NOTE — Telephone Encounter (Signed)
Confirmed and screened for 07-28-20 ov. 

## 2020-07-28 ENCOUNTER — Ambulatory Visit: Payer: Medicaid Other | Admitting: Nurse Practitioner

## 2020-08-01 ENCOUNTER — Telehealth: Payer: Self-pay

## 2020-08-01 NOTE — Telephone Encounter (Signed)
Billed missed appointment fee 07/28/20.

## 2020-08-12 ENCOUNTER — Other Ambulatory Visit: Payer: Self-pay

## 2020-08-12 DIAGNOSIS — G629 Polyneuropathy, unspecified: Secondary | ICD-10-CM

## 2020-08-12 DIAGNOSIS — F419 Anxiety disorder, unspecified: Secondary | ICD-10-CM

## 2020-08-12 MED ORDER — DULOXETINE HCL 30 MG PO CPEP: 30.0000 mg | ORAL_CAPSULE | Freq: Every day | ORAL | 3 refills | Status: DC

## 2020-08-12 MED ORDER — NORTRIPTYLINE HCL 25 MG PO CAPS: ORAL_CAPSULE | ORAL | 3 refills | Status: DC

## 2020-09-01 ENCOUNTER — Telehealth: Payer: Self-pay

## 2020-09-01 NOTE — Telephone Encounter (Signed)
Confirmed and screened for 09-06-20 ov.

## 2020-09-06 ENCOUNTER — Encounter: Payer: Self-pay | Admitting: Nurse Practitioner

## 2020-09-06 ENCOUNTER — Ambulatory Visit: Payer: Medicaid Other | Admitting: Nurse Practitioner

## 2020-09-06 ENCOUNTER — Other Ambulatory Visit: Payer: Self-pay

## 2020-09-06 VITALS — BP 104/62 | HR 71 | Temp 98.2°F | Resp 16 | Ht 65.0 in | Wt 162.8 lb

## 2020-09-06 DIAGNOSIS — G629 Polyneuropathy, unspecified: Secondary | ICD-10-CM | POA: Diagnosis not present

## 2020-09-06 DIAGNOSIS — G500 Trigeminal neuralgia: Secondary | ICD-10-CM

## 2020-09-06 DIAGNOSIS — F419 Anxiety disorder, unspecified: Secondary | ICD-10-CM | POA: Diagnosis not present

## 2020-09-06 MED ORDER — ALPRAZOLAM 0.5 MG PO TABS: 0.5000 mg | ORAL_TABLET | Freq: Two times a day (BID) | ORAL | 3 refills | Status: DC | PRN

## 2020-09-06 MED ORDER — DULOXETINE HCL 30 MG PO CPEP: 30.0000 mg | ORAL_CAPSULE | Freq: Every day | ORAL | 3 refills | Status: DC

## 2020-09-06 MED ORDER — CARBAMAZEPINE 200 MG PO TABS: 200.0000 mg | ORAL_TABLET | Freq: Two times a day (BID) | ORAL | 2 refills | Status: DC

## 2020-09-06 NOTE — Progress Notes (Signed)
[facility] [place], [place] [address]  Internal MEDICINE  Office Visit Note  Patient Name: Nicole Rich  [id number]  [id number]  Date of Service: 09/06/2020  Chief Complaint  Patient presents with  . Follow-up  . Anxiety  . Quality Metric Gaps    HIv screening, flu shot    The patient is here for routine follow up visit. She was seen for episode of trigeminal neuralgia at her last visit. Was treated with prednisone taper, valacyclovir, and continued lyrica. She states that treatment did help, but after she completed it, the neuralgia came right back. She was treated with 7 day treatment of tegretol which completely relieved her pain. She has finished this treatment and is back on nortriptyline to control flares. Today, she has no new concerns or complains. Would like to wait until her next visit to get flu shot. Advised her to make appointment for vaccine if she decides to get it prior to next visit. She voiced understanding and agreement. She states that she does need to have refills for some of her routine medications.        Current Medication: Outpatient Encounter Medications as of 09/06/2020  Medication Sig  . ALPRAZolam (XANAX) 0.5 MG tablet Take 1 tablet (0.5 mg total) by mouth 2 (two) times daily as needed. Take 1 tablet po BID prn anxiety  . Cyanocobalamin (VITAMIN B 12 PO) Take 1 tablet by mouth daily.  . DULoxetine (CYMBALTA) 30 MG capsule Take 1 capsule (30 mg total) by mouth daily.  . nortriptyline (PAMELOR) 25 MG capsule Take 1 to 2 capsules po QPM  . Omega-3 Fatty Acids (FISH OIL PO) Take 4,000 mg by mouth daily.  Marland Kitchen OVER THE COUNTER MEDICATION Take 2 capsules by mouth 2 (two) times daily.  . pregabalin (LYRICA) 50 MG capsule Take 1 capsule (50 mg total) by mouth 3 (three) times daily.  . [DISCONTINUED] ALPRAZolam (XANAX) 0.5 MG tablet Take 1 tablet (0.5 mg total) by mouth 2 (two) times daily as needed. Take 1 tablet po BID prn anxiety  .  [DISCONTINUED] DULoxetine (CYMBALTA) 30 MG capsule Take 1 capsule (30 mg total) by mouth daily.  . carbamazepine (TEGRETOL) 200 MG tablet Take 1 tablet (200 mg total) by mouth 2 (two) times daily for 21 days.  . meloxicam (MOBIC) 15 MG tablet Take 1 tablet (15 mg total) by mouth daily. (Patient not taking: Reported on 09/06/2020)  . tamsulosin (FLOMAX) 0.4 MG CAPS capsule Take 1 capsule (0.4 mg total) by mouth daily.  . [DISCONTINUED] carbamazepine (TEGRETOL) 200 MG tablet Take 1 tablet (200 mg total) by mouth 2 (two) times daily for 7 days.  . [DISCONTINUED] predniSONE (STERAPRED UNI-PAK 21 TAB) 10 MG (21) TBPK tablet 6 day taper - take by mouth as directed for 6 days (Patient not taking: Reported on 09/06/2020)  . [DISCONTINUED] valACYclovir (VALTREX) 1000 MG tablet Take 1 tablet (1,000 mg total) by mouth 2 (two) times daily. (Patient not taking: Reported on 09/06/2020)   No facility-administered encounter medications on file as of 09/06/2020.    Surgical History: Past Surgical History:  Procedure Laterality Date  . BREAST BIOPSY Left 1996   NEG  . CESAREAN SECTION    . CYSTOSCOPY/URETEROSCOPY/HOLMIUM LASER/STENT PLACEMENT Right 08/13/2018   Procedure: CYSTOSCOPY/URETEROSCOPY/HOLMIUM LASER/STENT PLACEMENT;  Surgeon: Hollice Espy, MD;  Location: [facility] ORS;  Service: Urology;  Laterality: Right;  . FOOT SURGERY Right    mortons neuroma  . TUBAL LIGATION    . UMBILICAL  HERNIA REPAIR      Medical History: Past Medical History:  Diagnosis Date  . Anxiety   . Arthritis    neck  . Family history of adverse reaction to anesthesia    dad can only stay sedated for a certain amount of time per pt  . Foot drop, right foot    WEARS BRACE  . GERD (gastroesophageal reflux disease)   . History of hiatal hernia   . History of kidney stones   . Peripheral neuropathy   . TMJ (dislocation of temporomandibular joint)     Family History: Family History  Problem Relation Age of Onset  . Cancer  Mother   . Diabetes Father   . Hypertension Father   . Cancer Brother   . Breast cancer Neg Hx     Social History   Socioeconomic History  . Marital status: Married    Spouse name: Not on file  . Number of children: Not on file  . Years of education: Not on file  . Highest education level: Not on file  Occupational History  . Not on file  Tobacco Use  . Smoking status: Former Smoker    Packs/day: 0.50    Years: 40.00    Pack years: 20.00    Types: Cigarettes    Quit date: 08/06/2016    Years since quitting: 4.0  . Smokeless tobacco: Never Used  Vaping Use  . Vaping Use: Some days  Substance and Sexual Activity  . Alcohol use: No  . Drug use: No  . Sexual activity: Yes  Other Topics Concern  . Not on file  Social History Narrative  . Not on file   Social Determinants of Health   Financial Resource Strain:   . Difficulty of Paying Living Expenses: Not on file  Food Insecurity:   . Worried About Charity fundraiser in the Last Year: Not on file  . Ran Out of Food in the Last Year: Not on file  Transportation Needs:   . Lack of Transportation (Medical): Not on file  . Lack of Transportation (Non-Medical): Not on file  Physical Activity:   . Days of Exercise per Week: Not on file  . Minutes of Exercise per Session: Not on file  Stress:   . Feeling of Stress : Not on file  Social Connections:   . Frequency of Communication with Friends and Family: Not on file  . Frequency of Social Gatherings with Friends and Family: Not on file  . Attends Religious Services: Not on file  . Active Member of Clubs or Organizations: Not on file  . Attends Archivist Meetings: Not on file  . Marital Status: Not on file  Intimate Partner Violence:   . Fear of Current or Ex-Partner: Not on file  . Emotionally Abused: Not on file  . Physically Abused: Not on file  . Sexually Abused: Not on file      Review of Systems  Constitutional: Negative for chills, fatigue and  unexpected weight change.  HENT: Negative for congestion, ear pain, facial swelling, postnasal drip, rhinorrhea, sneezing and sore throat.        Resolved trigeminal neuralgia.   Respiratory: Negative for cough, chest tightness, shortness of breath and wheezing.   Cardiovascular: Negative for chest pain and palpitations.  Gastrointestinal: Negative for abdominal pain, constipation, diarrhea, nausea and vomiting.  Endocrine: Negative for cold intolerance, heat intolerance, polydipsia and polyuria.  Musculoskeletal: Negative for arthralgias, back pain, joint swelling and neck  pain.  Skin: Negative for rash.  Neurological: Negative for dizziness, tremors, numbness and headaches.  Hematological: Negative for adenopathy. Does not bruise/bleed easily.  Psychiatric/Behavioral: Negative for behavioral problems (Depression), sleep disturbance and suicidal ideas. The patient is not nervous/anxious.     Today's Vitals   09/06/20 1425  BP: 104/62  Pulse: 71  Resp: 16  Temp: 98.2 F (36.8 C)  SpO2: 94%  Weight: 162 lb 12.8 oz (73.8 kg)  Height: 5\' 5"  (1.651 m)   Body mass index is 27.09 kg/m.  Physical Exam Vitals and nursing note reviewed.  Constitutional:      General: She is not in acute distress.    Appearance: Normal appearance. She is well-developed. She is not ill-appearing or diaphoretic.  HENT:     Head: Normocephalic and atraumatic.     Salivary Glands: Right salivary gland is not diffusely enlarged or tender. Left salivary gland is not diffusely enlarged or tender.     Right Ear: Ear canal and external ear normal.     Left Ear: Ear canal and external ear normal.     Mouth/Throat:     Pharynx: No oropharyngeal exudate.  Eyes:     Pupils: Pupils are equal, round, and reactive to light.  Neck:     Thyroid: No thyromegaly.     Vascular: No JVD.     Trachea: No tracheal deviation.  Cardiovascular:     Rate and Rhythm: Normal rate and regular rhythm.     Heart sounds: Normal  heart sounds. No murmur heard.  No friction rub. No gallop.   Pulmonary:     Effort: Pulmonary effort is normal. No respiratory distress.     Breath sounds: Normal breath sounds. No wheezing or rales.  Chest:     Chest wall: No tenderness.  Abdominal:     Palpations: Abdomen is soft.  Musculoskeletal:        General: Normal range of motion.     Cervical back: Normal range of motion and neck supple.  Lymphadenopathy:     Cervical: No cervical adenopathy.  Skin:    General: Skin is warm and dry.  Neurological:     General: No focal deficit present.     Mental Status: She is alert and oriented to person, place, and time.     Cranial Nerves: No cranial nerve deficit.  Psychiatric:        Mood and Affect: Mood normal.        Behavior: Behavior normal.        Thought Content: Thought content normal.        Judgment: Judgment normal.    Assessment/Plan: 1. Trigeminal neuralgia of left side of face Currently on nortriptyline daily. If new flare of neuralgia, may take tegretol 200mg  twice daily for seven days, holding routine dose of nortriptyline. New prescription sent to her pharmacy.   - carbamazepine (TEGRETOL) 200 MG tablet; Take 1 tablet (200 mg total) by mouth 2 (two) times daily for 21 days.  Dispense: 14 tablet; Refill: 2  2. Neuropathy Continue duloxetine 30mg  daily.  - DULoxetine (CYMBALTA) 30 MG capsule; Take 1 capsule (30 mg total) by mouth daily.  Dispense: 30 capsule; Refill: 3  3. Anxiety disorder, unspecified type May take alprazolam 0.5mg  up to twice daily as needed for acute anxiety. New prescription sent to her pharmacy today.  - ALPRAZolam (XANAX) 0.5 MG tablet; Take 1 tablet (0.5 mg total) by mouth 2 (two) times daily as needed. Take 1 tablet po  BID prn anxiety  Dispense: 45 tablet; Refill: 3  General Counseling: jim philemon understanding of the findings of todays visit and agrees with plan of treatment. I have discussed any further diagnostic evaluation that  may be needed or ordered today. We also reviewed her medications today. she has been encouraged to call the office with any questions or concerns that should arise related to todays visit.   This patient was seen by [place] with Dr Lavera Guise as a part of collaborative care agreement  Meds ordered this encounter  Medications  . DULoxetine (CYMBALTA) 30 MG capsule    Sig: Take 1 capsule (30 mg total) by mouth daily.    Dispense:  30 capsule    Refill:  3    Please d/c lyrica. Switching to cymbala.    Order Specific Question:   Supervising Provider    Answer:   Lavera Guise [[id number]]  . carbamazepine (TEGRETOL) 200 MG tablet    Sig: Take 1 tablet (200 mg total) by mouth 2 (two) times daily for 21 days.    Dispense:  14 tablet    Refill:  2    Order Specific Question:   Supervising Provider    Answer:   Lavera Guise [[id number]]  . ALPRAZolam (XANAX) 0.5 MG tablet    Sig: Take 1 tablet (0.5 mg total) by mouth 2 (two) times daily as needed. Take 1 tablet po BID prn anxiety    Dispense:  45 tablet    Refill:  3    Order Specific Question:   Supervising Provider    Answer:   Lavera Guise [[id number]]    Total time spent: 30 Minutes   Time spent includes review of chart, medications, test results, and follow up plan with the patient.      Dr Lavera Guise Internal medicine

## 2020-11-08 ENCOUNTER — Telehealth: Payer: Self-pay

## 2020-11-08 ENCOUNTER — Other Ambulatory Visit: Payer: Self-pay

## 2020-11-08 DIAGNOSIS — G500 Trigeminal neuralgia: Secondary | ICD-10-CM

## 2020-11-08 MED ORDER — PREGABALIN 50 MG PO CAPS: 50.0000 mg | ORAL_CAPSULE | Freq: Three times a day (TID) | ORAL | 1 refills | Status: DC

## 2020-11-09 ENCOUNTER — Other Ambulatory Visit: Payer: Self-pay | Admitting: Nurse Practitioner

## 2020-11-09 DIAGNOSIS — G500 Trigeminal neuralgia: Secondary | ICD-10-CM

## 2020-11-09 MED ORDER — PREGABALIN 50 MG PO CAPS: 50.0000 mg | ORAL_CAPSULE | Freq: Three times a day (TID) | ORAL | 1 refills | Status: DC

## 2020-11-09 NOTE — Telephone Encounter (Signed)
Approved and I sent to [place].

## 2020-11-11 ENCOUNTER — Ambulatory Visit: Payer: No Typology Code available for payment source

## 2020-12-29 ENCOUNTER — Encounter: Payer: Self-pay | Admitting: Nurse Practitioner

## 2020-12-29 ENCOUNTER — Ambulatory Visit (INDEPENDENT_AMBULATORY_CARE_PROVIDER_SITE_OTHER): Payer: Medicaid Other | Admitting: Nurse Practitioner

## 2020-12-29 VITALS — BP 120/73 | HR 73 | Temp 98.7°F | Resp 16 | Ht 65.0 in | Wt 161.6 lb

## 2020-12-29 DIAGNOSIS — F419 Anxiety disorder, unspecified: Secondary | ICD-10-CM

## 2020-12-29 DIAGNOSIS — L400 Psoriasis vulgaris: Secondary | ICD-10-CM

## 2020-12-29 DIAGNOSIS — Z0001 Encounter for general adult medical examination with abnormal findings: Secondary | ICD-10-CM | POA: Diagnosis not present

## 2020-12-29 DIAGNOSIS — G500 Trigeminal neuralgia: Secondary | ICD-10-CM | POA: Diagnosis not present

## 2020-12-29 DIAGNOSIS — Z124 Encounter for screening for malignant neoplasm of cervix: Secondary | ICD-10-CM | POA: Diagnosis not present

## 2020-12-29 DIAGNOSIS — R300 Dysuria: Secondary | ICD-10-CM

## 2020-12-29 DIAGNOSIS — G629 Polyneuropathy, unspecified: Secondary | ICD-10-CM

## 2020-12-29 MED ORDER — NORTRIPTYLINE HCL 25 MG PO CAPS: ORAL_CAPSULE | ORAL | 3 refills | Status: DC

## 2020-12-29 MED ORDER — ALPRAZOLAM 0.5 MG PO TABS: 0.5000 mg | ORAL_TABLET | Freq: Two times a day (BID) | ORAL | 2 refills | Status: DC | PRN

## 2020-12-29 MED ORDER — PREGABALIN 50 MG PO CAPS: 50.0000 mg | ORAL_CAPSULE | Freq: Three times a day (TID) | ORAL | 1 refills | Status: DC

## 2020-12-29 MED ORDER — DULOXETINE HCL 30 MG PO CPEP: 30.0000 mg | ORAL_CAPSULE | Freq: Every day | ORAL | 3 refills | Status: DC

## 2020-12-29 MED ORDER — TACROLIMUS 0.1 % EX OINT: TOPICAL_OINTMENT | Freq: Two times a day (BID) | CUTANEOUS | 1 refills | Status: DC

## 2020-12-29 NOTE — Progress Notes (Signed)
[facility] ([facility]) [address] [place], [place] [address]  Internal MEDICINE  Office Visit Note  Patient Name: Nicole Rich  [id number]  [id number]  Date of Service: 01/19/2021   Pt is here for routine health maintenance examination  Chief Complaint  Patient presents with  . Annual Exam     The patient is here for health maintenance exam and pap smear.  -spots on her forearms and hands. Start out as red bumps. They are itchy and then drain. Become plaque like, rough lesions which leave a scar. History of psoriasis.  -history of trigeminal neuralgia.  -neuralgia in legs. Taking lyrica has helped with this.      Current Medication: Outpatient Encounter Medications as of 12/29/2020  Medication Sig  . Cyanocobalamin (VITAMIN B 12 PO) Take 1 tablet by mouth daily.  . Omega-3 Fatty Acids (FISH OIL PO) Take 4,000 mg by mouth daily.  Marland Kitchen OVER THE COUNTER MEDICATION Take 2 capsules by mouth 2 (two) times daily.  . tacrolimus (PROTOPIC) 0.1 % ointment Apply topically 2 (two) times daily.  . [DISCONTINUED] ALPRAZolam (XANAX) 0.5 MG tablet Take 1 tablet (0.5 mg total) by mouth 2 (two) times daily as needed. Take 1 tablet po BID prn anxiety  . [DISCONTINUED] DULoxetine (CYMBALTA) 30 MG capsule Take 1 capsule (30 mg total) by mouth daily.  . [DISCONTINUED] nortriptyline (PAMELOR) 25 MG capsule Take 1 to 2 capsules po QPM  . [DISCONTINUED] pregabalin (LYRICA) 50 MG capsule Take 1 capsule (50 mg total) by mouth 3 (three) times daily.  Marland Kitchen ALPRAZolam (XANAX) 0.5 MG tablet Take 1 tablet (0.5 mg total) by mouth 2 (two) times daily as needed. Take 1 tablet po BID prn anxiety  . carbamazepine (TEGRETOL) 200 MG tablet Take 1 tablet (200 mg total) by mouth 2 (two) times daily for 21 days.  . DULoxetine (CYMBALTA) 30 MG capsule Take 1 capsule (30 mg total) by mouth daily.  . nortriptyline (PAMELOR) 25 MG capsule Take 1 to 2 capsules po QPM  . pregabalin (LYRICA) 50 MG capsule Take 1 capsule  (50 mg total) by mouth 3 (three) times daily.   No facility-administered encounter medications on file as of 12/29/2020.    Surgical History: Past Surgical History:  Procedure Laterality Date  . BREAST BIOPSY Left 1996   NEG  . CESAREAN SECTION    . CYSTOSCOPY/URETEROSCOPY/HOLMIUM LASER/STENT PLACEMENT Right 08/13/2018   Procedure: CYSTOSCOPY/URETEROSCOPY/HOLMIUM LASER/STENT PLACEMENT;  Surgeon: Vanna Scotland, MD;  Location: [facility] ORS;  Service: Urology;  Laterality: Right;  . FOOT SURGERY Right    mortons neuroma  . TUBAL LIGATION    . UMBILICAL HERNIA REPAIR      Medical History: Past Medical History:  Diagnosis Date  . Anxiety   . Arthritis    neck  . Family history of adverse reaction to anesthesia    dad can only stay sedated for a certain amount of time per pt  . Foot drop, right foot    WEARS BRACE  . GERD (gastroesophageal reflux disease)   . History of hiatal hernia   . History of kidney stones   . Peripheral neuropathy   . TMJ (dislocation of temporomandibular joint)     Family History: Family History  Problem Relation Age of Onset  . Cancer Mother   . Diabetes Father   . Hypertension Father   . Cancer Brother   . Breast cancer Neg Hx       Review of Systems  Constitutional: Negative for chills, fatigue  and unexpected weight change.  HENT: Negative for congestion, ear pain, facial swelling, postnasal drip, rhinorrhea, sneezing and sore throat.        Resolved trigeminal neuralgia.   Respiratory: Negative for cough, chest tightness, shortness of breath and wheezing.   Cardiovascular: Negative for chest pain and palpitations.  Gastrointestinal: Negative for abdominal pain, constipation, diarrhea, nausea and vomiting.  Endocrine: Negative for cold intolerance, heat intolerance, polydipsia and polyuria.  Genitourinary: Negative for dysuria, frequency and urgency.  Musculoskeletal: Negative for arthralgias, back pain, joint swelling and neck pain.  Skin:  Negative for rash.  Neurological: Negative for dizziness, tremors, numbness and headaches.  Hematological: Negative for adenopathy. Does not bruise/bleed easily.  Psychiatric/Behavioral: Negative for behavioral problems (Depression), sleep disturbance and suicidal ideas. The patient is nervous/anxious.      Today's Vitals   12/29/20 1442  BP: 120/73  Pulse: 73  Resp: 16  Temp: 98.7 F (37.1 C)  SpO2: 99%  Weight: 161 lb 9.6 oz (73.3 kg)  Height: 5\' 5"  (1.651 m)   Body mass index is 26.89 kg/m.  Physical Exam Vitals and nursing note reviewed.  Constitutional:      General: She is not in acute distress.    Appearance: Normal appearance. She is well-developed. She is not ill-appearing or diaphoretic.  HENT:     Head: Normocephalic and atraumatic.     Salivary Glands: Right salivary gland is not diffusely enlarged or tender. Left salivary gland is not diffusely enlarged or tender.     Right Ear: Ear canal and external ear normal.     Left Ear: Ear canal and external ear normal.     Mouth/Throat:     Pharynx: No oropharyngeal exudate.  Eyes:     Pupils: Pupils are equal, round, and reactive to light.  Neck:     Thyroid: No thyromegaly.     Vascular: No carotid bruit or JVD.     Trachea: No tracheal deviation.  Cardiovascular:     Rate and Rhythm: Normal rate and regular rhythm.     Pulses: Normal pulses.     Heart sounds: Normal heart sounds. No murmur heard. No friction rub. No gallop.   Pulmonary:     Effort: Pulmonary effort is normal. No respiratory distress.     Breath sounds: Normal breath sounds. No wheezing or rales.  Chest:     Chest wall: No tenderness.  Breasts:     Right: Normal. No swelling, bleeding, inverted nipple, mass, nipple discharge, skin change, tenderness or axillary adenopathy.     Left: Normal. No swelling, bleeding, inverted nipple, mass, nipple discharge, skin change, tenderness or axillary adenopathy.    Abdominal:     Palpations: Abdomen  is soft.     Hernia: There is no hernia in the left inguinal area or right inguinal area.  Genitourinary:    Labia:        Right: No tenderness or lesion.        Left: No tenderness or lesion.      Vagina: No vaginal discharge, erythema, tenderness or bleeding.     Cervix: No cervical motion tenderness, discharge, friability, lesion or erythema.     Uterus: Normal.      Adnexa: Right adnexa normal and left adnexa normal.     Comments: No tenderness, masses, or organomeglay present during bimanual exam . Musculoskeletal:        General: Normal range of motion.     Cervical back: Normal range of motion and neck  supple.  Lymphadenopathy:     Cervical: No cervical adenopathy.     Upper Body:     Right upper body: No axillary adenopathy.     Left upper body: No axillary adenopathy.     Lower Body: No right inguinal adenopathy. No left inguinal adenopathy.  Skin:    General: Skin is warm and dry.  Neurological:     General: No focal deficit present.     Mental Status: She is alert and oriented to person, place, and time.     Cranial Nerves: No cranial nerve deficit.  Psychiatric:        Mood and Affect: Mood normal.        Behavior: Behavior normal.        Thought Content: Thought content normal.        Judgment: Judgment normal.      LABS: Recent Results (from the past 2160 hour(s))  UA/M w/rflx Culture, Routine     Status: None   Collection Time: 12/29/20  2:30 PM   Specimen: Urine   Urine  Result Value Ref Range   Specific Gravity, UA 1.018 1.005 - 1.030   pH, UA 7.0 5.0 - 7.5   Color, UA Yellow Yellow   Appearance Ur Clear Clear   Leukocytes,UA Negative Negative   Protein,UA Negative Negative/Trace   Glucose, UA Negative Negative   Ketones, UA Negative Negative   RBC, UA Negative Negative   Bilirubin, UA Negative Negative   Urobilinogen, Ur 1.0 0.2 - 1.0 mg/dL   Nitrite, UA Negative Negative   Microscopic Examination Comment     Comment: Microscopic follows if  indicated.   Microscopic Examination See below:     Comment: Microscopic was indicated and was performed.   Urinalysis Reflex Comment     Comment: This specimen has reflexed to a Urine Culture.  Microscopic Examination     Status: Abnormal   Collection Time: 12/29/20  2:30 PM   Urine  Result Value Ref Range   WBC, UA 0-5 0 - 5 /hpf   RBC 0-2 0 - 2 /hpf   Epithelial Cells (non renal) >10 (A) 0 - 10 /hpf   Casts None seen None seen /lpf   Bacteria, UA Moderate (A) None seen/Few  Urine Culture, Reflex     Status: Abnormal   Collection Time: 12/29/20  2:30 PM   Urine  Result Value Ref Range   Urine Culture, Routine Final report (A)    Organism ID, Bacteria Comment (A)     Comment: Beta hemolytic Streptococcus, group C Greater than 100,000 colony forming units per mL Penicillin and ampicillin are drugs of choice for treatment of beta-hemolytic streptococcal infections. Susceptibility testing of penicillins and other beta-lactam agents approved by the FDA for treatment of beta-hemolytic streptococcal infections need not be performed routinely because nonsusceptible isolates are extremely rare in any beta-hemolytic streptococcus and have not been reported for Streptococcus pyogenes (group A). (CLSI)   IGP, Aptima HPV     Status: None   Collection Time: 12/29/20  2:39 PM  Result Value Ref Range   Interpretation NILM     Comment: NEGATIVE FOR INTRAEPITHELIAL LESION OR MALIGNANCY.   Category NIL     Comment: Negative for Intraepithelial Lesion   Adequacy ENDO     Comment: Satisfactory for evaluation. Endocervical and/or squamous metaplastic cells (endocervical component) are present.    Clinician Provided ICD10 Comment     Comment: Z12.4   Performed by: Comment     Comment:  Alwyn Pea, Cytotechnologist (ASCP)   Note: Comment     Comment: The Pap smear is a screening test designed to aid in the detection of premalignant and malignant conditions of the uterine cervix.  It is not  a diagnostic procedure and should not be used as the sole means of detecting cervical cancer.  Both false-positive and false-negative reports do occur.    Test Methodology Comment     Comment: This liquid based ThinPrep(R) pap test was screened with the use of an image guided system.    HPV Aptima Negative Negative    Comment: This nucleic acid amplification test detects fourteen high-risk HPV types (16,18,31,33,35,39,45,51,52,56,58,59,66,68) without differentiation.     Assessment/Plan: 1. Encounter for general adult medical examination with abnormal findings Annual health maintenance exam today with pap smear.   2. Trigeminal neuralgia of left side of face May continue lyrica 50mg  three times daily - refills provided today. - pregabalin (LYRICA) 50 MG capsule; Take 1 capsule (50 mg total) by mouth 3 (three) times daily.  Dispense: 90 capsule; Refill: 1  3. Neuropathy Continue cymbalta and nortriptyline as previously prescribed. Refills provided today.  - nortriptyline (PAMELOR) 25 MG capsule; Take 1 to 2 capsules po QPM  Dispense: 60 capsule; Refill: 3 - DULoxetine (CYMBALTA) 30 MG capsule; Take 1 capsule (30 mg total) by mouth daily.  Dispense: 30 capsule; Refill: 3  4. Anxiety disorder, unspecified type Continue nortriptyline every eveing. May take alprazolam 0.5mg  tablets p t twice daily when needed for acute anxiety. Refills provided today.  - nortriptyline (PAMELOR) 25 MG capsule; Take 1 to 2 capsules po QPM  Dispense: 60 capsule; Refill: 3 - ALPRAZolam (XANAX) 0.5 MG tablet; Take 1 tablet (0.5 mg total) by mouth 2 (two) times daily as needed. Take 1 tablet po BID prn anxiety  Dispense: 45 tablet; Refill: 2  5. Plaque psoriasis Add protopic ointment - apply to all effected areas twice daily as needed. Consider referral to dermatology if needed.  - tacrolimus (PROTOPIC) 0.1 % ointment; Apply topically 2 (two) times daily.  Dispense: 100 g; Refill: 1  6. Routine cervical  smear - IGP, Aptima HPV  7. Dysuria - UA/M w/rflx Culture, Routine  General Counseling: gwendloyn safran understanding of the findings of todays visit and agrees with plan of treatment. I have discussed any further diagnostic evaluation that may be needed or ordered today. We also reviewed her medications today. she has been encouraged to call the office with any questions or concerns that should arise related to todays visit.    Counseling:  This patient was seen by Leretha Pol FNP Collaboration with Dr Lavera Guise as a part of collaborative care agreement  Orders Placed This Encounter  Procedures  . Microscopic Examination  . Urine Culture, Reflex  . UA/M w/rflx Culture, Routine    Meds ordered this encounter  Medications  . pregabalin (LYRICA) 50 MG capsule    Sig: Take 1 capsule (50 mg total) by mouth 3 (three) times daily.    Dispense:  90 capsule    Refill:  1    Order Specific Question:   Supervising Provider    Answer:   Lavera Guise [id number]  . nortriptyline (PAMELOR) 25 MG capsule    Sig: Take 1 to 2 capsules po QPM    Dispense:  60 capsule    Refill:  3    Order Specific Question:   Supervising Provider    Answer:   Lavera Guise [id number]  .  DULoxetine (CYMBALTA) 30 MG capsule    Sig: Take 1 capsule (30 mg total) by mouth daily.    Dispense:  30 capsule    Refill:  3    Please d/c lyrica. Switching to cymbala.    Order Specific Question:   Supervising Provider    Answer:   Lavera Guise [id number]  . ALPRAZolam (XANAX) 0.5 MG tablet    Sig: Take 1 tablet (0.5 mg total) by mouth 2 (two) times daily as needed. Take 1 tablet po BID prn anxiety    Dispense:  45 tablet    Refill:  2    Order Specific Question:   Supervising Provider    Answer:   Lavera Guise [id number]  . tacrolimus (PROTOPIC) 0.1 % ointment    Sig: Apply topically 2 (two) times daily.    Dispense:  100 g    Refill:  1    Please fill with generic alternative preferred by her insurance. Thanks.     Order Specific Question:   Supervising Provider    Answer:   Lavera Guise [id number]    Total time spent: 53 Minutes  Time spent includes review of chart, medications, test results, and follow up plan with the patient.     Lavera Guise, MD  Internal Medicine

## 2021-01-02 LAB — MICROSCOPIC EXAMINATION
Casts: NONE SEEN /lpf
Epithelial Cells (non renal): >10 /hpf — AB (ref 0–10)

## 2021-01-02 LAB — UA/M W/RFLX CULTURE, ROUTINE
Bilirubin, UA: NEGATIVE
Glucose, UA: NEGATIVE
Ketones, UA: NEGATIVE
Leukocytes,UA: NEGATIVE
Nitrite, UA: NEGATIVE
Protein,UA: NEGATIVE
RBC, UA: NEGATIVE
Specific Gravity, UA: 1.018 (ref 1.005–1.030)
Urobilinogen, Ur: 1 mg/dL (ref 0.2–1.0)
pH, UA: 7 (ref 5.0–7.5)

## 2021-01-02 LAB — URINE CULTURE, REFLEX

## 2021-01-03 LAB — IGP, APTIMA HPV: HPV Aptima: NEGATIVE

## 2021-01-06 ENCOUNTER — Telehealth: Payer: Self-pay

## 2021-01-06 NOTE — Telephone Encounter (Signed)
Authorization approved for Protopic oin 0.1% approved through 01/05/2022 [facility]

## 2021-01-19 DIAGNOSIS — Z124 Encounter for screening for malignant neoplasm of cervix: Secondary | ICD-10-CM | POA: Insufficient documentation

## 2021-01-19 DIAGNOSIS — L400 Psoriasis vulgaris: Secondary | ICD-10-CM | POA: Insufficient documentation

## 2021-03-21 ENCOUNTER — Ambulatory Visit: Payer: Medicaid Other | Admitting: Hospice and Palliative Medicine

## 2021-03-24 ENCOUNTER — Ambulatory Visit: Payer: Medicaid Other | Admitting: Hospice and Palliative Medicine

## 2021-03-24 ENCOUNTER — Encounter: Payer: Self-pay | Admitting: Hospice and Palliative Medicine

## 2021-03-24 ENCOUNTER — Other Ambulatory Visit: Payer: Self-pay

## 2021-03-24 VITALS — BP 100/64 | HR 68 | Temp 97.9°F | Resp 16 | Ht 65.0 in | Wt 153.2 lb

## 2021-03-24 DIAGNOSIS — G500 Trigeminal neuralgia: Secondary | ICD-10-CM | POA: Diagnosis not present

## 2021-03-24 DIAGNOSIS — F419 Anxiety disorder, unspecified: Secondary | ICD-10-CM | POA: Diagnosis not present

## 2021-03-24 DIAGNOSIS — Z1231 Encounter for screening mammogram for malignant neoplasm of breast: Secondary | ICD-10-CM

## 2021-03-24 DIAGNOSIS — G629 Polyneuropathy, unspecified: Secondary | ICD-10-CM

## 2021-03-24 DIAGNOSIS — L400 Psoriasis vulgaris: Secondary | ICD-10-CM

## 2021-03-24 DIAGNOSIS — R5383 Other fatigue: Secondary | ICD-10-CM

## 2021-03-24 MED ORDER — ALPRAZOLAM 0.5 MG PO TABS: 0.5000 mg | ORAL_TABLET | Freq: Two times a day (BID) | ORAL | 2 refills | Status: DC | PRN

## 2021-03-24 MED ORDER — NORTRIPTYLINE HCL 25 MG PO CAPS: ORAL_CAPSULE | ORAL | 3 refills | Status: DC

## 2021-03-24 MED ORDER — PREGABALIN 50 MG PO CAPS: 50.0000 mg | ORAL_CAPSULE | Freq: Three times a day (TID) | ORAL | 1 refills | Status: DC

## 2021-03-24 NOTE — Progress Notes (Signed)
[facility] [place], [place] [address]  Internal MEDICINE  Office Visit Note  Patient Name: Nicole Rich  [id number]  [id number]  Date of Service: 03/27/2021  Chief Complaint  Patient presents with  . Peripheral Neuropathy    3 month f-up    HPI Patient is here for routine follow-up History of peripheral neuropathy, TMJ and trigeminal neuralgia? Per her reports--diagnoses are from several years ago, has been controlled with alprazolam, nortriptyline and Pregabalin Seen by neurology in 2019 for peroneal neuropathy and trigeminal neuralgia (initial diagnosis in 2017), at her last visit in 2019 EMG improved with normal nerve conduction studies and [place]  Has long standing history of plaque psoriasis--insurance denied tacrolimus cream prescribed at last visit, has not been evaluated by dermatology   Current Medication: Outpatient Encounter Medications as of 03/24/2021  Medication Sig  . Cyanocobalamin (VITAMIN B 12 PO) Take 1 tablet by mouth daily.  . Omega-3 Fatty Acids (FISH OIL PO) Take 4,000 mg by mouth daily.  Marland Kitchen OVER THE COUNTER MEDICATION Take 2 capsules by mouth 2 (two) times daily.  . tacrolimus (PROTOPIC) 0.1 % ointment Apply topically 2 (two) times daily.  . [DISCONTINUED] ALPRAZolam (XANAX) 0.5 MG tablet Take 1 tablet (0.5 mg total) by mouth 2 (two) times daily as needed. Take 1 tablet po BID prn anxiety  . [DISCONTINUED] DULoxetine (CYMBALTA) 30 MG capsule Take 1 capsule (30 mg total) by mouth daily.  . [DISCONTINUED] nortriptyline (PAMELOR) 25 MG capsule Take 1 to 2 capsules po QPM  . [DISCONTINUED] pregabalin (LYRICA) 50 MG capsule Take 1 capsule (50 mg total) by mouth 3 (three) times daily.  Marland Kitchen ALPRAZolam (XANAX) 0.5 MG tablet Take 1 tablet (0.5 mg total) by mouth 2 (two) times daily as needed. Take 1 tablet po BID prn anxiety  . carbamazepine (TEGRETOL) 200 MG tablet Take 1 tablet (200 mg total) by mouth 2 (two) times daily for 21 days.  .  nortriptyline (PAMELOR) 25 MG capsule Take 1 to 2 capsules po QPM  . pregabalin (LYRICA) 50 MG capsule Take 1 capsule (50 mg total) by mouth 3 (three) times daily.   No facility-administered encounter medications on file as of 03/24/2021.    Surgical History: Past Surgical History:  Procedure Laterality Date  . BREAST BIOPSY Left 1996   NEG  . CESAREAN SECTION    . CYSTOSCOPY/URETEROSCOPY/HOLMIUM LASER/STENT PLACEMENT Right 08/13/2018   Procedure: CYSTOSCOPY/URETEROSCOPY/HOLMIUM LASER/STENT PLACEMENT;  Surgeon: Hollice Espy, MD;  Location: [facility] ORS;  Service: Urology;  Laterality: Right;  . FOOT SURGERY Right    mortons neuroma  . TUBAL LIGATION    . UMBILICAL HERNIA REPAIR      Medical History: Past Medical History:  Diagnosis Date  . Anxiety   . Arthritis    neck  . Family history of adverse reaction to anesthesia    dad can only stay sedated for a certain amount of time per pt  . Foot drop, right foot    WEARS BRACE  . GERD (gastroesophageal reflux disease)   . History of hiatal hernia   . History of kidney stones   . Peripheral neuropathy   . TMJ (dislocation of temporomandibular joint)     Family History: Family History  Problem Relation Age of Onset  . Cancer Mother   . Diabetes Father   . Hypertension Father   . Cancer Brother   . Breast cancer Neg Hx     Social History   Socioeconomic History  . Marital  status: Married    Spouse name: Not on file  . Number of children: Not on file  . Years of education: Not on file  . Highest education level: Not on file  Occupational History  . Not on file  Tobacco Use  . Smoking status: Former Smoker    Packs/day: 0.50    Years: 40.00    Pack years: 20.00    Types: Cigarettes    Quit date: 08/06/2016    Years since quitting: 4.6  . Smokeless tobacco: Never Used  Vaping Use  . Vaping Use: Some days  Substance and Sexual Activity  . Alcohol use: No  . Drug use: No  . Sexual activity: Yes  Other Topics  Concern  . Not on file  Social History Narrative  . Not on file   Social Determinants of Health   Financial Resource Strain: Not on file  Food Insecurity: Not on file  Transportation Needs: Not on file  Physical Activity: Not on file  Stress: Not on file  Social Connections: Not on file  Intimate Partner Violence: Not on file      Review of Systems  Constitutional: Negative for chills, diaphoresis and fatigue.  HENT: Negative for ear pain, postnasal drip and sinus pressure.   Eyes: Negative for photophobia, discharge, redness, itching and visual disturbance.  Respiratory: Negative for cough, shortness of breath and wheezing.   Cardiovascular: Negative for chest pain, palpitations and leg swelling.  Gastrointestinal: Negative for abdominal pain, constipation, diarrhea, nausea and vomiting.  Genitourinary: Negative for dysuria and flank pain.  Musculoskeletal: Positive for arthralgias and myalgias. Negative for back pain, gait problem and neck pain.  Skin: Negative for color change.  Allergic/Immunologic: Negative for environmental allergies and food allergies.  Neurological: Positive for numbness. Negative for dizziness and headaches.  Hematological: Does not bruise/bleed easily.  Psychiatric/Behavioral: Negative for agitation, behavioral problems (depression) and hallucinations.    Vital Signs: BP 100/64   Pulse 68   Temp 97.9 F (36.6 C)   Resp 16   Ht 5\' 5"  (1.651 m)   Wt 153 lb 3.2 oz (69.5 kg)   SpO2 98%   BMI 25.49 kg/m    Physical Exam Vitals reviewed.  Constitutional:      Appearance: Normal appearance. She is normal weight.  Cardiovascular:     Rate and Rhythm: Normal rate and regular rhythm.     Pulses: Normal pulses.     Heart sounds: Normal heart sounds.  Pulmonary:     Effort: Pulmonary effort is normal.     Breath sounds: Normal breath sounds.  Abdominal:     General: Abdomen is flat.     Palpations: Abdomen is soft.  Musculoskeletal:         General: Normal range of motion.     Cervical back: Normal range of motion.  Skin:    General: Skin is warm.  Neurological:     General: No focal deficit present.     Mental Status: She is alert and oriented to person, place, and time. Mental status is at baseline.  Psychiatric:        Mood and Affect: Mood normal.        Behavior: Behavior normal.        Thought Content: Thought content normal.        Judgment: Judgment normal.     Assessment/Plan: 1. Plaque psoriasis Referral placed to dermatology for further evaluation and management - Ambulatory referral to Dermatology  2. Trigeminal neuralgia of  left side of face Continue with current management, will likely need referral to neurology for further evaluation and management - pregabalin (LYRICA) 50 MG capsule; Take 1 capsule (50 mg total) by mouth 3 (three) times daily.  Dispense: 90 capsule; Refill: 1 - nortriptyline (PAMELOR) 25 MG capsule; Take 1 to 2 capsules po QPM  Dispense: 60 capsule; Refill: 3  3. Neuropathy Continue with current management, will likely need referral to neurology for further evaluation and management - pregabalin (LYRICA) 50 MG capsule; Take 1 capsule (50 mg total) by mouth 3 (three) times daily.  Dispense: 90 capsule; Refill: 1 - nortriptyline (PAMELOR) 25 MG capsule; Take 1 to 2 capsules po QPM  Dispense: 60 capsule; Refill: 3  4. Anxiety disorder, unspecified type Continue current dosing, will need to consider starting SSRI/SNRI for better control of symptoms and to wean use of alprazolam [place] Controlled Substance Database was reviewed by me for overdose risk score (ORS) Will need UDS at next visit - nortriptyline (PAMELOR) 25 MG capsule; Take 1 to 2 capsules po QPM  Dispense: 60 capsule; Refill: 3 - ALPRAZolam (XANAX) 0.5 MG tablet; Take 1 tablet (0.5 mg total) by mouth 2 (two) times daily as needed. Take 1 tablet po BID prn anxiety  Dispense: 45 tablet; Refill: 2  5. Encounter for screening  mammogram for malignant neoplasm of breast - MM Digital Screening; Future  6. Other fatigue - CBC w/Diff/Platelet - Comprehensive Metabolic Panel (CMET) - Lipid Panel With LDL/HDL Ratio - TSH + free T4  General Counseling: Gracy verbalizes understanding of the findings of todays visit and agrees with plan of treatment. I have discussed any further diagnostic evaluation that may be needed or ordered today. We also reviewed her medications today. she has been encouraged to call the office with any questions or concerns that should arise related to todays visit.    Orders Placed This Encounter  Procedures  . MM Digital Screening  . CBC w/Diff/Platelet  . Comprehensive Metabolic Panel (CMET)  . Lipid Panel With LDL/HDL Ratio  . TSH + free T4  . Ambulatory referral to Dermatology    Meds ordered this encounter  Medications  . pregabalin (LYRICA) 50 MG capsule    Sig: Take 1 capsule (50 mg total) by mouth 3 (three) times daily.    Dispense:  90 capsule    Refill:  1  . nortriptyline (PAMELOR) 25 MG capsule    Sig: Take 1 to 2 capsules po QPM    Dispense:  60 capsule    Refill:  3  . ALPRAZolam (XANAX) 0.5 MG tablet    Sig: Take 1 tablet (0.5 mg total) by mouth 2 (two) times daily as needed. Take 1 tablet po BID prn anxiety    Dispense:  45 tablet    Refill:  2    Time spent: 30 Minutes Time spent includes review of chart, medications, test results and follow-up plan with the patient.  This patient was seen by Theodoro Grist AGNP-C in Collaboration with Dr Lavera Guise as a part of collaborative care agreement     Tanna Furry. Grayson White AGNP-C Internal medicine

## 2021-03-27 ENCOUNTER — Encounter: Payer: Self-pay | Admitting: Hospice and Palliative Medicine

## 2021-05-30 ENCOUNTER — Other Ambulatory Visit: Payer: Self-pay | Admitting: Hospice and Palliative Medicine

## 2021-05-30 DIAGNOSIS — Z1231 Encounter for screening mammogram for malignant neoplasm of breast: Secondary | ICD-10-CM

## 2021-05-31 ENCOUNTER — Telehealth: Payer: Self-pay

## 2021-06-09 ENCOUNTER — Ambulatory Visit: Payer: No Typology Code available for payment source | Admitting: Family Medicine

## 2021-06-13 ENCOUNTER — Other Ambulatory Visit: Payer: Self-pay | Admitting: Internal Medicine

## 2021-06-13 DIAGNOSIS — Z1231 Encounter for screening mammogram for malignant neoplasm of breast: Secondary | ICD-10-CM

## 2021-06-13 NOTE — Telephone Encounter (Signed)
Per [facility] referral for mammogram was placed.

## 2021-06-30 ENCOUNTER — Other Ambulatory Visit: Payer: Self-pay | Admitting: Internal Medicine

## 2021-06-30 DIAGNOSIS — Z1231 Encounter for screening mammogram for malignant neoplasm of breast: Secondary | ICD-10-CM

## 2021-07-05 ENCOUNTER — Encounter: Payer: Self-pay | Admitting: Nurse Practitioner

## 2021-07-05 ENCOUNTER — Other Ambulatory Visit: Payer: Self-pay

## 2021-07-05 ENCOUNTER — Ambulatory Visit: Payer: Medicaid Other | Admitting: Nurse Practitioner

## 2021-07-05 ENCOUNTER — Ambulatory Visit
Admission: RE | Admit: 2021-07-05 | Discharge: 2021-07-05 | Disposition: A | Discharge status: Home / Self Care | Payer: Medicaid Other | Source: Ambulatory Visit | Attending: Internal Medicine | Admitting: Internal Medicine

## 2021-07-05 DIAGNOSIS — Z1231 Encounter for screening mammogram for malignant neoplasm of breast: Secondary | ICD-10-CM | POA: Insufficient documentation

## 2021-07-05 DIAGNOSIS — G629 Polyneuropathy, unspecified: Secondary | ICD-10-CM | POA: Diagnosis not present

## 2021-07-05 DIAGNOSIS — F419 Anxiety disorder, unspecified: Secondary | ICD-10-CM

## 2021-07-05 DIAGNOSIS — G500 Trigeminal neuralgia: Secondary | ICD-10-CM

## 2021-07-05 NOTE — Progress Notes (Signed)
[facility] [place], [place] [address]  Internal MEDICINE  Office Visit Note  Patient Name: Nicole Rich  [id number]  [id number]  Date of Service: 07/05/2021  Chief Complaint  Patient presents with   Gastroesophageal Reflux   Anxiety   Temporomandibular Joint Pain   Arthritis   Follow-up    Med refills    HPI Nicole Rich presents for a follow up visit for medication refills. She has a history of GERD, anxiety, TMJ pain, and arthritis. She reports that her anxiety is stable on her current medications. Her TMJ pain does not bother her very much and she avoids doing anything that would potentially aggravate it such as chewing gum. She states that her acid reflux is mild and does not take any medications or it at this time. She denies any pain at this time, and she does not have any other questions or concerns today.     Current Medication: Outpatient Encounter Medications as of 07/05/2021  Medication Sig   ALPRAZolam (XANAX) 0.5 MG tablet Take 1 tablet (0.5 mg total) by mouth 2 (two) times daily as needed. Take 1 tablet po BID prn anxiety   Cyanocobalamin (VITAMIN B 12 PO) Take 1 tablet by mouth daily.   nortriptyline (PAMELOR) 25 MG capsule Take 1 to 2 capsules po QPM   Omega-3 Fatty Acids (FISH OIL PO) Take 4,000 mg by mouth daily.   OVER THE COUNTER MEDICATION Take 2 capsules by mouth 2 (two) times daily.   pregabalin (LYRICA) 50 MG capsule Take 1 capsule (50 mg total) by mouth 3 (three) times daily.   carbamazepine (TEGRETOL) 200 MG tablet Take 1 tablet (200 mg total) by mouth 2 (two) times daily for 21 days.   [DISCONTINUED] tacrolimus (PROTOPIC) 0.1 % ointment Apply topically 2 (two) times daily. (Patient not taking: Reported on 07/05/2021)   No facility-administered encounter medications on file as of 07/05/2021.    Surgical History: Past Surgical History:  Procedure Laterality Date   BREAST BIOPSY Left 1996   NEG   CESAREAN SECTION      CYSTOSCOPY/URETEROSCOPY/HOLMIUM LASER/STENT PLACEMENT Right 08/13/2018   Procedure: CYSTOSCOPY/URETEROSCOPY/HOLMIUM LASER/STENT PLACEMENT;  Surgeon: Hollice Espy, MD;  Location: [facility] ORS;  Service: Urology;  Laterality: Right;   FOOT SURGERY Right    mortons neuroma   TUBAL LIGATION     UMBILICAL HERNIA REPAIR      Medical History: Past Medical History:  Diagnosis Date   Anxiety    Arthritis    neck   Family history of adverse reaction to anesthesia    dad can only stay sedated for a certain amount of time per pt   Foot drop, right foot    WEARS BRACE   GERD (gastroesophageal reflux disease)    History of hiatal hernia    History of kidney stones    Peripheral neuropathy    TMJ (dislocation of temporomandibular joint)     Family History: Family History  Problem Relation Age of Onset   Cancer Mother    Diabetes Father    Hypertension Father    Cancer Brother    Breast cancer Neg Hx     Social History   Socioeconomic History   Marital status: Married    Spouse name: Not on file   Number of children: Not on file   Years of education: Not on file   Highest education level: Not on file  Occupational History   Not on file  Tobacco Use   Smoking  status: Former    Packs/day: 0.50    Years: 40.00    Pack years: 20.00    Types: Cigarettes    Quit date: 08/06/2016    Years since quitting: 4.9   Smokeless tobacco: Never  Vaping Use   Vaping Use: Some days  Substance and Sexual Activity   Alcohol use: No   Drug use: No   Sexual activity: Yes  Other Topics Concern   Not on file  Social History Narrative   Not on file   Social Determinants of Health   Financial Resource Strain: Not on file  Food Insecurity: Not on file  Transportation Needs: Not on file  Physical Activity: Not on file  Stress: Not on file  Social Connections: Not on file  Intimate Partner Violence: Not on file      Review of Systems  Constitutional:  Negative for chills, fatigue and  unexpected weight change.  HENT:  Negative for congestion, rhinorrhea, sneezing and sore throat.   Eyes:  Negative for redness.  Respiratory:  Negative for cough, chest tightness and shortness of breath.   Cardiovascular:  Negative for chest pain and palpitations.  Gastrointestinal:  Negative for abdominal pain, constipation, diarrhea, nausea and vomiting.  Genitourinary:  Negative for dysuria and frequency.  Musculoskeletal:  Positive for arthralgias. Negative for back pain, joint swelling and neck pain.       Facial pain from trigeminal neuralgia, treated with pregabalin.   Skin:  Negative for rash.  Neurological: Negative.  Negative for tremors and numbness.  Hematological:  Negative for adenopathy. Does not bruise/bleed easily.  Psychiatric/Behavioral:  Negative for behavioral problems (Depression), sleep disturbance and suicidal ideas. The patient is nervous/anxious.    Vital Signs: BP 107/72   Pulse 60   Temp 98 F (36.7 C)   Resp 16   Ht 5\' 5"  (1.651 m)   Wt 153 lb 6.4 oz (69.6 kg)   SpO2 99%   BMI 25.53 kg/m    Physical Exam Vitals reviewed.  Constitutional:      General: She is not in acute distress.    Appearance: Normal appearance. She is not ill-appearing.  Cardiovascular:     Rate and Rhythm: Normal rate and regular rhythm.     Pulses: Normal pulses.     Heart sounds: Normal heart sounds. No murmur heard. Pulmonary:     Effort: Pulmonary effort is normal. No respiratory distress.     Breath sounds: Normal breath sounds. No wheezing.  Skin:    General: Skin is warm and dry.     Capillary Refill: Capillary refill takes less than 2 seconds.  Neurological:     Mental Status: She is alert and oriented to person, place, and time.  Psychiatric:        Mood and Affect: Mood is anxious.        Behavior: Behavior normal.       Assessment/Plan: 1. Trigeminal neuralgia of left side of face Stable, pregabalin, carbamazepine, and nortriptyline refill ordered  2.  Neuropathy No changes, Pregabalin refill ordered  3. Anxiety disorder, unspecified type Stable, Alprazolam refill ordered.   General Counseling: Nicole Rich understanding of the findings of todays visit and agrees with plan of treatment. I have discussed any further diagnostic evaluation that may be needed or ordered today. We also reviewed her medications today. she has been encouraged to call the office with any questions or concerns that should arise related to todays visit.    No orders of the defined  types were placed in this encounter.   No orders of the defined types were placed in this encounter.   Return in about 25 weeks (around 12/27/2021) for CPE, [place] PCP.   Total time spent:30 Minutes Time spent includes review of chart, medications, test results, and follow up plan with the patient.   [place] Controlled Substance Database was reviewed by me.  This patient was seen by Jonetta Osgood, FNP-C in collaboration with Dr. Clayborn Bigness as a part of collaborative care agreement.   Devanta Daniel R. Valetta Fuller, MSN, FNP-C Internal medicine

## 2021-07-10 MED ORDER — NORTRIPTYLINE HCL 25 MG PO CAPS: ORAL_CAPSULE | ORAL | 3 refills | Status: DC

## 2021-07-10 MED ORDER — CARBAMAZEPINE 200 MG PO TABS: 200.0000 mg | ORAL_TABLET | Freq: Two times a day (BID) | ORAL | 2 refills | Status: DC

## 2021-07-10 MED ORDER — ALPRAZOLAM 0.5 MG PO TABS: 0.5000 mg | ORAL_TABLET | Freq: Two times a day (BID) | ORAL | 2 refills | Status: DC | PRN

## 2021-07-10 MED ORDER — PREGABALIN 50 MG PO CAPS
50.0000 mg | ORAL_CAPSULE | Freq: Three times a day (TID) | ORAL | 1 refills | Status: DC
Start: 2021-07-10 — End: 2021-11-29

## 2021-10-04 ENCOUNTER — Encounter: Payer: Self-pay | Admitting: Dermatology

## 2021-10-04 ENCOUNTER — Other Ambulatory Visit: Payer: Self-pay

## 2021-10-04 ENCOUNTER — Ambulatory Visit (INDEPENDENT_AMBULATORY_CARE_PROVIDER_SITE_OTHER): Payer: No Typology Code available for payment source | Admitting: Dermatology

## 2021-10-04 DIAGNOSIS — R21 Rash and other nonspecific skin eruption: Secondary | ICD-10-CM | POA: Diagnosis not present

## 2021-10-04 DIAGNOSIS — Z872 Personal history of diseases of the skin and subcutaneous tissue: Secondary | ICD-10-CM

## 2021-10-04 DIAGNOSIS — L820 Inflamed seborrheic keratosis: Secondary | ICD-10-CM | POA: Diagnosis not present

## 2021-10-04 MED ORDER — DOXYCYCLINE HYCLATE 100 MG PO TABS: 100.0000 mg | ORAL_TABLET | ORAL | 2 refills | Status: AC

## 2021-10-04 MED ORDER — MUPIROCIN 2 % EX OINT: 1.0000 "application " | TOPICAL_OINTMENT | Freq: Two times a day (BID) | CUTANEOUS | 2 refills | Status: DC

## 2021-10-04 NOTE — Patient Instructions (Signed)

## 2021-10-04 NOTE — Progress Notes (Signed)
   New Patient Visit  Subjective  Nicole Rich is a 60 y.o. female who presents for the following: Rash (Of arms x ~ 3 years that itches. She scratches a lot in her sleep. Comes up as a little red bump. Worse in the summer).  Referred by Jonetta Osgood, NP  The following portions of the chart were reviewed this encounter and updated as appropriate:   Tobacco  Allergies  Meds  Problems  Med Hx  Surg Hx  Fam Hx     Review of Systems:  No other skin or systemic complaints except as noted in HPI or Assessment and Plan.  Objective  Well appearing patient in no apparent distress; mood and affect are within normal limits.  A focused examination was performed including arms. Relevant physical exam findings are noted in the Assessment and Plan.  Arms Crusts and excoriations         Left Buccal Cheek Erythematous keratotic or waxy stuck-on papule or plaque.    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  Wakes patient up at night scratching   No primary lesions today to biopsy  May plan biopsy when she has a new lesion  May consider Rivoq in the future once impetigo cleared if consistent with Atopic Neurodermatitis  For treatment of folliculitis with impetiginization and excoriations: Start doxycycline (VIBRA-TABS) 100 MG tablet - Arms Take 1 tablet (100 mg total) by mouth as directed. Bid x 1 week then decrease to qd - Take with food and plenty of fluid And mupirocin ointment (BACTROBAN) 2 % - Arms Apply 1 application topically 2 (two) times daily.  Inflamed seborrheic keratosis Left Buccal Cheek May treat on follow up  Return in about 2 months (around 12/04/2021).  I, Ashok Cordia, CMA, am acting as scribe for Sarina Ser, MD . Documentation: I have reviewed the above documentation for accuracy and completeness, and I agree with the above.  Sarina Ser,  MD

## 2021-10-25 ENCOUNTER — Ambulatory Visit: Payer: No Typology Code available for payment source | Admitting: Dermatology

## 2021-11-28 ENCOUNTER — Other Ambulatory Visit: Payer: Self-pay | Admitting: Internal Medicine

## 2021-11-28 DIAGNOSIS — G500 Trigeminal neuralgia: Secondary | ICD-10-CM

## 2021-11-28 DIAGNOSIS — G629 Polyneuropathy, unspecified: Secondary | ICD-10-CM

## 2021-11-28 NOTE — Telephone Encounter (Signed)
Last 7/222 and next 12/11/21

## 2021-11-29 NOTE — Telephone Encounter (Signed)
Med sent for 1 refill, will need to come to next appt for additional refills

## 2021-12-11 ENCOUNTER — Encounter: Payer: Medicaid Other | Admitting: Nurse Practitioner

## 2021-12-12 ENCOUNTER — Ambulatory Visit: Payer: No Typology Code available for payment source | Admitting: Dermatology

## 2021-12-14 ENCOUNTER — Other Ambulatory Visit: Payer: Self-pay | Admitting: Internal Medicine

## 2021-12-14 DIAGNOSIS — F419 Anxiety disorder, unspecified: Secondary | ICD-10-CM

## 2021-12-14 NOTE — Telephone Encounter (Signed)
Last appt was 07-05-21, next appt 01-25-22

## 2021-12-15 NOTE — Telephone Encounter (Signed)
Med sent.

## 2022-01-25 ENCOUNTER — Ambulatory Visit: Payer: Medicaid Other | Admitting: Nurse Practitioner

## 2022-01-25 ENCOUNTER — Other Ambulatory Visit: Payer: Self-pay

## 2022-01-25 ENCOUNTER — Encounter: Payer: Self-pay | Admitting: Nurse Practitioner

## 2022-01-25 VITALS — BP 108/82 | HR 60 | Temp 98.1°F | Resp 16 | Ht 65.0 in | Wt 163.2 lb

## 2022-01-25 DIAGNOSIS — Z0001 Encounter for general adult medical examination with abnormal findings: Secondary | ICD-10-CM

## 2022-01-25 DIAGNOSIS — G629 Polyneuropathy, unspecified: Secondary | ICD-10-CM

## 2022-01-25 DIAGNOSIS — E782 Mixed hyperlipidemia: Secondary | ICD-10-CM | POA: Diagnosis not present

## 2022-01-25 DIAGNOSIS — E559 Vitamin D deficiency, unspecified: Secondary | ICD-10-CM

## 2022-01-25 DIAGNOSIS — R300 Dysuria: Secondary | ICD-10-CM

## 2022-01-25 DIAGNOSIS — E039 Hypothyroidism, unspecified: Secondary | ICD-10-CM

## 2022-01-25 DIAGNOSIS — E538 Deficiency of other specified B group vitamins: Secondary | ICD-10-CM | POA: Diagnosis not present

## 2022-01-25 DIAGNOSIS — F419 Anxiety disorder, unspecified: Secondary | ICD-10-CM

## 2022-01-25 DIAGNOSIS — G500 Trigeminal neuralgia: Secondary | ICD-10-CM

## 2022-01-25 MED ORDER — ALPRAZOLAM 0.5 MG PO TABS: 0.5000 mg | ORAL_TABLET | Freq: Two times a day (BID) | ORAL | 0 refills | Status: DC | PRN

## 2022-01-25 MED ORDER — NORTRIPTYLINE HCL 50 MG PO CAPS: 50.0000 mg | ORAL_CAPSULE | Freq: Two times a day (BID) | ORAL | 1 refills | Status: DC

## 2022-01-25 MED ORDER — PREGABALIN 50 MG PO CAPS: 50.0000 mg | ORAL_CAPSULE | Freq: Three times a day (TID) | ORAL | 2 refills | Status: DC

## 2022-01-25 NOTE — Progress Notes (Signed)
[facility] [place],  [id number]  Internal MEDICINE  Office Visit Note  Patient Name: Nicole [name]  [id number]  [id number]  Date of Service: 01/25/2022  Chief Complaint  Patient presents with   Annual Exam    Discuss meds   Anxiety   Gastroesophageal Reflux     Nicole [name] presents for an annual well visit and physical exam. Nicole [name] a well appearing 61 yo female with Trigeminal neuralgia, anxiety and GERD. Nicole [name] mammogram was done in July 2022 and was normal. Nicole [name] not due for pap until 2026 and for routine colonoscopy in 2025.  Nicole [name] has been having increased pain with Nicole [name] trigeminal neuralgia and [name] wanting to increase Nicole [name] nortriptyline dose.  Nicole [name] due for routine labs. Nicole [name] blood pressure [name] wnl. Nicole [name] takes pregabalin to help with the nerve pain and Nicole [name] takes alprazolam for anxiety.  Nicole [name] not having any pain at this time but states the trigeminal neuralgia episodes have been coming more frequently. Nicole [name] has not other questions or concerns.    Current Medication: Outpatient Encounter Medications as of 01/25/2022  Medication Sig   Cyanocobalamin (VITAMIN B 12 PO) Take 1 tablet by mouth daily.   doxycycline (VIBRA-TABS) 100 MG tablet Take 1 tablet (100 mg total) by mouth as directed. Bid x 1 week then decrease to qd - Take with food and plenty of fluid   mupirocin ointment (BACTROBAN) 2 % Apply 1 application topically 2 (two) times daily.   nortriptyline (PAMELOR) 50 MG capsule Take 1 capsule (50 mg total) by mouth 2 (two) times daily.   Omega-3 Fatty Acids (FISH OIL PO) Take 4,000 mg by mouth daily.   OVER THE COUNTER MEDICATION Take 2 capsules by mouth 2 (two) times daily.   [DISCONTINUED] ALPRAZolam (XANAX) 0.5 MG tablet Take 1 tablet (0.5 mg total) by mouth 2 (two) times daily as needed for anxiety.   [DISCONTINUED] nortriptyline (PAMELOR) 25 MG capsule Take 1 to 2 capsules po QPM   [DISCONTINUED] pregabalin (LYRICA) 50 MG capsule TAKE 1 CAPSULE BY  MOUTH 3 TIMES A DAY   ALPRAZolam (XANAX) 0.5 MG tablet Take 1 tablet (0.5 mg total) by mouth 2 (two) times daily as needed for anxiety.   carbamazepine (TEGRETOL) 200 MG tablet Take 1 tablet (200 mg total) by mouth 2 (two) times daily for 21 days.   pregabalin (LYRICA) 50 MG capsule Take 1 capsule (50 mg total) by mouth 3 (three) times daily.   No facility-administered encounter medications on file as of 01/25/2022.    Surgical History: Past Surgical History:  Procedure Laterality Date   BREAST BIOPSY Left 1996   NEG   CESAREAN SECTION     CYSTOSCOPY/URETEROSCOPY/HOLMIUM LASER/STENT PLACEMENT Right 08/13/2018   Procedure: CYSTOSCOPY/URETEROSCOPY/HOLMIUM LASER/STENT PLACEMENT;  Surgeon: Hollice Espy, MD;  Location: [facility] ORS;  Service: Urology;  Laterality: Right;   FOOT SURGERY Right    mortons neuroma   TUBAL LIGATION     UMBILICAL HERNIA REPAIR      Medical History: Past Medical History:  Diagnosis Date   Anxiety    Arthritis    neck   Family history of adverse reaction to anesthesia    dad can only stay sedated for a certain amount of time per pt   Foot drop, right foot    WEARS BRACE   GERD (gastroesophageal reflux disease)    History of hiatal hernia    History of kidney stones    Peripheral neuropathy  TMJ (dislocation of temporomandibular joint)     Family History: Family History  Problem Relation Age of Onset   Cancer Mother    Diabetes Father    Hypertension Father    Cancer Brother    Breast cancer Neg Hx     Social History   Socioeconomic History   Marital status: Married    Spouse name: Not on file   Number of children: Not on file   Years of education: Not on file   Highest education level: Not on file  Occupational History   Not on file  Tobacco Use   Smoking status: Former    Packs/day: 0.50    Years: 40.00    Pack years: 20.00    Types: Cigarettes    Quit date: 08/06/2016    Years since quitting: 5.4   Smokeless tobacco: Never   Vaping Use   Vaping Use: Some days  Substance and Sexual Activity   Alcohol use: No   Drug use: No   Sexual activity: Yes  Other Topics Concern   Not on file  Social History Narrative   Not on file   Social Determinants of Health   Financial Resource Strain: Not on file  Food Insecurity: Not on file  Transportation Needs: Not on file  Physical Activity: Not on file  Stress: Not on file  Social Connections: Not on file  Intimate Partner Violence: Not on file      Review of Systems  Constitutional:  Negative for activity change, appetite change, chills, fatigue, fever and unexpected weight change.  HENT: Negative.  Negative for congestion, ear pain, rhinorrhea, sore throat and trouble swallowing.   Eyes: Negative.   Respiratory: Negative.  Negative for cough, chest tightness, shortness of breath and wheezing.   Cardiovascular: Negative.  Negative for chest pain.  Gastrointestinal: Negative.  Negative for abdominal pain, blood in stool, constipation, diarrhea, nausea and vomiting.  Endocrine: Negative.   Genitourinary: Negative.  Negative for difficulty urinating, dysuria, frequency, hematuria and urgency.  Musculoskeletal: Negative.  Negative for arthralgias, back pain, joint swelling, myalgias and neck pain.  Skin: Negative.  Negative for rash and wound.  Allergic/Immunologic: Negative.  Negative for immunocompromised state.  Neurological: Negative.  Negative for dizziness, seizures, numbness and headaches.  Hematological: Negative.   Psychiatric/Behavioral: Negative.  Negative for behavioral problems, self-injury and suicidal ideas. The patient [name] not nervous/anxious.    Vital Signs: BP 108/82    Pulse 60    Temp 98.1 F (36.7 C)    Resp 16    Ht '5\' 5"'  (1.651 m)    Wt 163 lb 3.2 oz (74 kg)    SpO2 99%    BMI 27.16 kg/m    Physical Exam Vitals reviewed.  Constitutional:      General: Nicole [name] awake. Nicole [name] not in acute distress.    Appearance: Normal appearance. Nicole [name]  [name] well-developed, well-groomed and overweight. Nicole [name] not ill-appearing or diaphoretic.  HENT:     Head: Normocephalic and atraumatic.     Right Ear: Tympanic membrane, ear canal and external ear normal.     Left Ear: Tympanic membrane, ear canal and external ear normal.     Nose: Nose normal. No congestion or rhinorrhea.     Mouth/Throat:     Lips: Pink.     Mouth: Mucous membranes are moist.     Pharynx: Oropharynx [name] clear. Uvula midline. No oropharyngeal exudate or posterior oropharyngeal erythema.  Eyes:     General: Lids  are normal. Vision grossly intact. Gaze aligned appropriately. No scleral icterus.       Right eye: No discharge.        Left eye: No discharge.     Extraocular Movements: Extraocular movements intact.     Conjunctiva/sclera: Conjunctivae normal.     Pupils: Pupils are equal, round, and reactive to light.     Funduscopic exam:    Right eye: Red reflex present.        Left eye: Red reflex present. Neck:     Thyroid: No thyromegaly.     Vascular: No carotid bruit or JVD.     Trachea: Trachea and phonation normal. No tracheal deviation.  Cardiovascular:     Rate and Rhythm: Normal rate and regular rhythm.     Pulses: Normal pulses.     Heart sounds: Normal heart sounds, S1 normal and S2 normal. No murmur heard.   No friction rub. No gallop.  Pulmonary:     Effort: Pulmonary effort [name] normal. No accessory muscle usage or respiratory distress.     Breath sounds: Normal breath sounds and air entry. No stridor. No wheezing or rales.  Chest:     Chest wall: No tenderness.  Breasts:    Right: Normal. No swelling, bleeding, inverted nipple, mass, nipple discharge, skin change or tenderness.     Left: Normal. No swelling, bleeding, inverted nipple, mass, nipple discharge, skin change or tenderness.  Abdominal:     General: Bowel sounds are normal. There [name] no distension.     Palpations: Abdomen [name] soft. There [name] no shifting dullness, fluid wave, mass or pulsatile  mass.     Tenderness: There [name] no abdominal tenderness. There [name] no guarding or rebound.  Musculoskeletal:        General: No tenderness or deformity. Normal range of motion.     Cervical back: Normal range of motion and neck supple.     Right lower leg: No edema.     Left lower leg: No edema.  Lymphadenopathy:     Cervical: No cervical adenopathy.     Upper Body:     Right upper body: No supraclavicular, axillary or pectoral adenopathy.     Left upper body: No supraclavicular, axillary or pectoral adenopathy.  Skin:    General: Skin [name] warm and dry.     Capillary Refill: Capillary refill takes less than 2 seconds.     Coloration: Skin [name] not pale.     Findings: No erythema or rash.  Neurological:     Mental Status: Nicole [name] alert and oriented to person, place, and time.     Cranial Nerves: No cranial nerve deficit.     Motor: No abnormal muscle tone.     Coordination: Coordination normal.     Deep Tendon Reflexes: Reflexes are normal and symmetric.  Psychiatric:        Mood and Affect: Mood normal.        Behavior: Behavior normal. Behavior [name] cooperative.        Thought Content: Thought content normal.        Judgment: Judgment normal.       Assessment/Plan: 1. Encounter for general adult medical examination with abnormal findings Age-appropriate preventive screenings and vaccinations discussed, annual physical exam completed. Routine labs for health maintenance ordered, see below. PHM updated.   2. Trigeminal neuralgia of left side of face Nortriptyline dose increased. Follow up in 1 month. Routine labs ordered. - nortriptyline (PAMELOR) 50 MG capsule; Take 1  capsule (50 mg total) by mouth 2 (two) times daily.  Dispense: 180 capsule; Refill: 1 - pregabalin (LYRICA) 50 MG capsule; Take 1 capsule (50 mg total) by mouth 3 (three) times daily.  Dispense: 90 capsule; Refill: 2 - CBC with Differential/Platelet - CMP14+EGFR  3. B12 deficiency Routine lab ordered - CBC with  Differential/Platelet - B12 and Folate Panel  4. Mixed hyperlipidemia Routine lab ordered - Lipid Profile  5. Vitamin D deficiency Routine lab ordered - Vitamin D (25 hydroxy)  6. Hypothyroidism, unspecified type Routine labs ordered  - CBC with Differential/Platelet - TSH + free T4  7. Dysuria Routine urinalysis done - UA/M w/rflx Culture, Routine - Microscopic Examination  8. Anxiety disorder, unspecified type Number of tabs increased to 60 since Nicole [name] has needed 2 doses per day more often due to increased anxiety and stress - ALPRAZolam (XANAX) 0.5 MG tablet; Take 1 tablet (0.5 mg total) by mouth 2 (two) times daily as needed for anxiety.  Dispense: 60 tablet; Refill: 0      General Counseling: jovana rembold understanding of the findings of todays visit and agrees with plan of treatment. I have discussed any further diagnostic evaluation that may be needed or ordered today. We also reviewed Nicole [name] medications today. Nicole [name] has been encouraged to call the office with any questions or concerns that should arise related to todays visit.    Orders Placed This Encounter  Procedures   UA/M w/rflx Culture, Routine   CBC with Differential/Platelet   B12 and Folate Panel   CMP14+EGFR   Lipid Profile   TSH + free T4   Vitamin D (25 hydroxy)    Meds ordered this encounter  Medications   nortriptyline (PAMELOR) 50 MG capsule    Sig: Take 1 capsule (50 mg total) by mouth 2 (two) times daily.    Dispense:  180 capsule    Refill:  1    Note increased dose and number of capsules, discontinue nortriptyline 25 mg capsule.   ALPRAZolam (XANAX) 0.5 MG tablet    Sig: Take 1 tablet (0.5 mg total) by mouth 2 (two) times daily as needed for anxiety.    Dispense:  60 tablet    Refill:  0    Number of tablets increased to 60   pregabalin (LYRICA) 50 MG capsule    Sig: Take 1 capsule (50 mg total) by mouth 3 (three) times daily.    Dispense:  90 capsule    Refill:  2    Return in  about 3 months (around 04/25/2022) for F/U, med refill, [place] [place] PCP.   Total time spent:30 Minutes Time spent includes review of chart, medications, test results, and follow up plan with the patient.   [place] Controlled Substance Database was reviewed by me.  This patient was seen by Jonetta Osgood, FNP-C in collaboration with Dr. Clayborn Bigness as a part of collaborative care agreement.  Shakeera Rightmyer R. Valetta Fuller, MSN, FNP-C Internal medicine

## 2022-01-26 LAB — UA/M W/RFLX CULTURE, ROUTINE
Bilirubin, UA: NEGATIVE
Glucose, UA: NEGATIVE
Ketones, UA: NEGATIVE
Leukocytes,UA: NEGATIVE
Nitrite, UA: NEGATIVE
Protein,UA: NEGATIVE
RBC, UA: NEGATIVE
Specific Gravity, UA: 1.012 (ref 1.005–1.030)
Urobilinogen, Ur: 0.2 mg/dL (ref 0.2–1.0)
pH, UA: 7.5 (ref 5.0–7.5)

## 2022-01-26 LAB — MICROSCOPIC EXAMINATION
Casts: NONE SEEN /lpf
RBC, Urine: NONE SEEN /hpf (ref 0–2)
WBC, UA: NONE SEEN /hpf (ref 0–5)

## 2022-01-28 ENCOUNTER — Encounter: Payer: Self-pay | Admitting: Nurse Practitioner

## 2022-02-26 ENCOUNTER — Other Ambulatory Visit: Payer: Self-pay | Admitting: Nurse Practitioner

## 2022-02-26 DIAGNOSIS — F419 Anxiety disorder, unspecified: Secondary | ICD-10-CM

## 2022-03-19 ENCOUNTER — Ambulatory Visit: Payer: No Typology Code available for payment source | Admitting: Dermatology

## 2022-04-23 ENCOUNTER — Encounter: Payer: Self-pay | Admitting: Nurse Practitioner

## 2022-04-23 ENCOUNTER — Ambulatory Visit: Payer: Medicaid Other | Admitting: Nurse Practitioner

## 2022-04-23 VITALS — BP 114/73 | HR 70 | Temp 98.4°F | Resp 16 | Ht 65.0 in | Wt 173.0 lb

## 2022-04-23 DIAGNOSIS — K219 Gastro-esophageal reflux disease without esophagitis: Secondary | ICD-10-CM

## 2022-04-23 DIAGNOSIS — G500 Trigeminal neuralgia: Secondary | ICD-10-CM

## 2022-04-23 DIAGNOSIS — F419 Anxiety disorder, unspecified: Secondary | ICD-10-CM | POA: Diagnosis not present

## 2022-04-23 MED ORDER — ALPRAZOLAM 0.5 MG PO TABS: 0.5000 mg | ORAL_TABLET | Freq: Two times a day (BID) | ORAL | 2 refills | Status: DC | PRN

## 2022-04-23 MED ORDER — PREGABALIN 50 MG PO CAPS: 50.0000 mg | ORAL_CAPSULE | Freq: Three times a day (TID) | ORAL | 2 refills | Status: DC

## 2022-04-23 MED ORDER — CARBAMAZEPINE 200 MG PO TABS: 200.0000 mg | ORAL_TABLET | Freq: Two times a day (BID) | ORAL | 3 refills | Status: DC

## 2022-04-23 NOTE — Progress Notes (Signed)
[facility] [place], [place] [address]  Internal MEDICINE  Office Visit Note  Patient Name: Nicole Rich  [id number]  [id number]  Date of Service: 04/23/2022  Chief Complaint  Patient presents with   Follow-up    Discuss meds   Medication Refill   Anxiety   Gastroesophageal Reflux    HPI [name] presents for follow-up visit for trigeminal neuralgia, anxiety and medication refill.  Couple of years ago patient was started on nortriptyline to help with migraine headaches.  At her previous office visit the nortriptyline dose was increased although this would get control of her. Nortriptyline not helping, had some trigeminal neuralgia attacks.  Wants to go back on tegretol     Current Medication: Outpatient Encounter Medications as of 04/23/2022  Medication Sig   Cyanocobalamin (VITAMIN B 12 PO) Take 1 tablet by mouth daily.   mupirocin ointment (BACTROBAN) 2 % Apply 1 application topically 2 (two) times daily.   Omega-3 Fatty Acids (FISH OIL PO) Take 4,000 mg by mouth daily.   OVER THE COUNTER MEDICATION Take 2 capsules by mouth 2 (two) times daily.   [DISCONTINUED] ALPRAZolam (XANAX) 0.5 MG tablet TAKE 1 TABLET BY MOUTH 2 TIMES DAILY AS NEEDED FOR ANXIETY   [DISCONTINUED] nortriptyline (PAMELOR) 50 MG capsule Take 1 capsule (50 mg total) by mouth 2 (two) times daily.   [DISCONTINUED] pregabalin (LYRICA) 50 MG capsule Take 1 capsule (50 mg total) by mouth 3 (three) times daily.   ALPRAZolam (XANAX) 0.5 MG tablet Take 1 tablet (0.5 mg total) by mouth 2 (two) times daily as needed for anxiety.   carbamazepine (TEGRETOL) 200 MG tablet Take 1 tablet (200 mg total) by mouth 2 (two) times daily.   pregabalin (LYRICA) 50 MG capsule Take 1 capsule (50 mg total) by mouth 3 (three) times daily.   [DISCONTINUED] carbamazepine (TEGRETOL) 200 MG tablet Take 1 tablet (200 mg total) by mouth 2 (two) times daily for 21 days.   No facility-administered encounter  medications on file as of 04/23/2022.    Surgical History: Past Surgical History:  Procedure Laterality Date   BREAST BIOPSY Left 1996   NEG   CESAREAN SECTION     CYSTOSCOPY/URETEROSCOPY/HOLMIUM LASER/STENT PLACEMENT Right 08/13/2018   Procedure: CYSTOSCOPY/URETEROSCOPY/HOLMIUM LASER/STENT PLACEMENT;  Surgeon: Hollice Espy, MD;  Location: [facility] ORS;  Service: Urology;  Laterality: Right;   FOOT SURGERY Right    mortons neuroma   TUBAL LIGATION     UMBILICAL HERNIA REPAIR      Medical History: Past Medical History:  Diagnosis Date   Anxiety    Arthritis    neck   Family history of adverse reaction to anesthesia    dad can only stay sedated for a certain amount of time per pt   Foot drop, right foot    WEARS BRACE   GERD (gastroesophageal reflux disease)    History of hiatal hernia    History of kidney stones    Peripheral neuropathy    TMJ (dislocation of temporomandibular joint)     Family History: Family History  Problem Relation Age of Onset   Cancer Mother    Diabetes Father    Hypertension Father    Cancer Brother    Breast cancer Neg Hx     Social History   Socioeconomic History   Marital status: Married    Spouse name: Not on file   Number of children: Not on file   Years of education: Not on file  Highest education level: Not on file  Occupational History   Not on file  Tobacco Use   Smoking status: Former    Packs/day: 0.50    Years: 40.00    Pack years: 20.00    Types: Cigarettes    Quit date: 08/06/2016    Years since quitting: 5.7   Smokeless tobacco: Never  Vaping Use   Vaping Use: Some days  Substance and Sexual Activity   Alcohol use: No   Drug use: No   Sexual activity: Yes  Other Topics Concern   Not on file  Social History Narrative   Not on file   Social Determinants of Health   Financial Resource Strain: Not on file  Food Insecurity: Not on file  Transportation Needs: Not on file  Physical Activity: Not on file   Stress: Not on file  Social Connections: Not on file  Intimate Partner Violence: Not on file      Review of Systems  Constitutional:  Negative for chills, fatigue and unexpected weight change.  HENT:  Negative for congestion, rhinorrhea, sneezing and sore throat.   Eyes:  Negative for redness.  Respiratory:  Negative for cough, chest tightness and shortness of breath.   Cardiovascular:  Negative for chest pain and palpitations.  Gastrointestinal:  Negative for abdominal pain, constipation, diarrhea, nausea and vomiting.  Genitourinary:  Negative for dysuria and frequency.  Musculoskeletal:  Positive for arthralgias. Negative for back pain, joint swelling and neck pain.       Facial pain from trigeminal neuralgia, treated with pregabalin.   Skin:  Negative for rash.  Neurological: Negative.  Negative for tremors and numbness.  Hematological:  Negative for adenopathy. Does not bruise/bleed easily.  Psychiatric/Behavioral:  Negative for behavioral problems (Depression), sleep disturbance and suicidal ideas. The patient is nervous/anxious.    Vital Signs: BP 114/73   Pulse 70   Temp 98.4 F (36.9 C)   Resp 16   Ht '5\' 5"'$  (1.651 m)   Wt 173 lb (78.5 kg)   SpO2 98%   BMI 28.79 kg/m    Physical Exam Vitals reviewed.  Constitutional:      General: She is not in acute distress.    Appearance: Normal appearance. She is not ill-appearing.  Cardiovascular:     Rate and Rhythm: Normal rate and regular rhythm.     Pulses: Normal pulses.     Heart sounds: Normal heart sounds. No murmur heard. Pulmonary:     Effort: Pulmonary effort is normal. No respiratory distress.     Breath sounds: Normal breath sounds. No wheezing.  Skin:    General: Skin is warm and dry.     Capillary Refill: Capillary refill takes less than 2 seconds.  Neurological:     Mental Status: She is alert and oriented to person, place, and time.  Psychiatric:        Mood and Affect: Mood is anxious.         Behavior: Behavior normal.       Assessment/Plan: 1. Trigeminal neuralgia of left side of face Nortriptyline discontinued, patient to restart tegretol and continue lyrica.  - carbamazepine (TEGRETOL) 200 MG tablet; Take 1 tablet (200 mg total) by mouth 2 (two) times daily.  Dispense: 180 tablet; Refill: 3 - pregabalin (LYRICA) 50 MG capsule; Take 1 capsule (50 mg total) by mouth 3 (three) times daily.  Dispense: 90 capsule; Refill: 2  2. Gastroesophageal reflux disease without esophagitis Stable with current medication, no changes  3. Anxiety  disorder, unspecified type Stable, continue alprazolam as prescribed. Refills ordered.  - ALPRAZolam (XANAX) 0.5 MG tablet; Take 1 tablet (0.5 mg total) by mouth 2 (two) times daily as needed for anxiety.  Dispense: 60 tablet; Refill: 2   General Counseling: shianne zeiser understanding of the findings of todays visit and agrees with plan of treatment. I have discussed any further diagnostic evaluation that may be needed or ordered today. We also reviewed her medications today. she has been encouraged to call the office with any questions or concerns that should arise related to todays visit.    No orders of the defined types were placed in this encounter.   Meds ordered this encounter  Medications   carbamazepine (TEGRETOL) 200 MG tablet    Sig: Take 1 tablet (200 mg total) by mouth 2 (two) times daily.    Dispense:  180 tablet    Refill:  3    Risks and side effects discussed with patient, she has been on this medication before, please discontinue nortriptyline.   pregabalin (LYRICA) 50 MG capsule    Sig: Take 1 capsule (50 mg total) by mouth 3 (three) times daily.    Dispense:  90 capsule    Refill:  2   ALPRAZolam (XANAX) 0.5 MG tablet    Sig: Take 1 tablet (0.5 mg total) by mouth 2 (two) times daily as needed for anxiety.    Dispense:  60 tablet    Refill:  2    Return in about 3 months (around 07/23/2022) for F/U, anxiety med  refill, med refill, Lainee Lehrman PCP.   Total time spent:30 Minutes Time spent includes review of chart, medications, test results, and follow up plan with the patient.   [place] Controlled Substance Database was reviewed by me.  This patient was seen by Jonetta Osgood, FNP-C in collaboration with Dr. Clayborn Bigness as a part of collaborative care agreement.   Etienne Millward R. Valetta Fuller, MSN, FNP-C Internal medicine

## 2022-04-25 ENCOUNTER — Ambulatory Visit: Payer: No Typology Code available for payment source | Admitting: Dermatology

## 2022-05-26 ENCOUNTER — Encounter: Payer: Self-pay | Admitting: Nurse Practitioner

## 2022-06-28 ENCOUNTER — Encounter: Payer: Self-pay | Admitting: Internal Medicine

## 2022-06-28 ENCOUNTER — Ambulatory Visit: Payer: Medicaid Other | Admitting: Internal Medicine

## 2022-06-28 VITALS — BP 120/74 | HR 46 | Temp 98.5°F | Resp 16 | Ht 65.0 in | Wt 173.4 lb

## 2022-06-28 DIAGNOSIS — M7989 Other specified soft tissue disorders: Secondary | ICD-10-CM

## 2022-06-28 DIAGNOSIS — R001 Bradycardia, unspecified: Secondary | ICD-10-CM | POA: Diagnosis not present

## 2022-06-28 DIAGNOSIS — I739 Peripheral vascular disease, unspecified: Secondary | ICD-10-CM

## 2022-06-28 DIAGNOSIS — E538 Deficiency of other specified B group vitamins: Secondary | ICD-10-CM | POA: Diagnosis not present

## 2022-06-28 DIAGNOSIS — R0602 Shortness of breath: Secondary | ICD-10-CM | POA: Diagnosis not present

## 2022-06-28 NOTE — Progress Notes (Signed)
[facility] [place], [place] [address]  Internal MEDICINE  Office Visit Note  Patient Name: Nicole Rich  [id number]  [id number]  Date of Service: 07/09/2022  Chief Complaint  Patient presents with   Acute Visit    Both legs swelling, painful, has been swelling for a while (about a year)     HPI Pt is here for a sick visit. Pt is here with c/o BLE, right more than left, has SOB, Feels weak at times. Pt is found to be bradycardiac here in the office  She is diagnosed to have right peroneal neuropathy. She was initially seen in clinic 04/28/18 and referred for repeat EMG. 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 Needle electrode examination reveals evolving reinnervation of right common peroneal innervated muscles.   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    Current Medication:  Outpatient Encounter Medications as of 06/28/2022  Medication Sig   ALPRAZolam (XANAX) 0.5 MG tablet Take 1 tablet (0.5 mg total) by mouth 2 (two) times daily as needed for anxiety.   carbamazepine (TEGRETOL) 200 MG tablet Take 1 tablet (200 mg total) by mouth 2 (two) times daily.   Cyanocobalamin (VITAMIN B 12 PO) Take 1 tablet by mouth daily.   mupirocin ointment (BACTROBAN) 2 % Apply 1 application topically 2 (two) times daily.   Omega-3 Fatty Acids (FISH OIL PO) Take 4,000 mg by mouth daily. (Patient not taking: Reported on 07/05/2022)   OVER THE COUNTER MEDICATION Take 2 capsules by mouth 2 (two) times daily. (Patient not taking: Reported on 07/05/2022)   pregabalin (LYRICA) 50 MG capsule Take 1 capsule (50 mg total) by mouth 3 (three) times daily.   No facility-administered  encounter medications on file as of 06/28/2022.      Medical History: Past Medical History:  Diagnosis Date   Anxiety    Arthritis    neck   Family history of adverse reaction to anesthesia    dad can only stay sedated for a certain amount of time per pt   Foot drop, right foot    WEARS BRACE   GERD (gastroesophageal reflux disease)    History of hiatal hernia    History of kidney stones    Peripheral neuropathy    TMJ (dislocation of temporomandibular joint)      Vital Signs: BP 120/74   Pulse (!) 46   Temp 98.5 F (36.9 C)   Resp 16   Ht '5\' 5"'$  (1.651 m)   Wt 173 lb 6.4 oz (78.7 kg)   SpO2 97%   BMI 28.86 kg/m    Review of Systems  Constitutional:  Negative for fatigue and fever.  HENT:  Negative for congestion, mouth sores and postnasal drip.   Respiratory:  Negative for cough.   Cardiovascular:  Positive for leg swelling. Negative for chest pain.  Genitourinary:  Negative for flank pain.  Skin:  Positive for color change.  Neurological:  Positive for light-headedness.  Psychiatric/Behavioral: Negative.      Physical Exam Constitutional:      Appearance: Normal appearance.  HENT:     Head: Normocephalic and atraumatic.     Nose: Nose normal.     Mouth/Throat:     Mouth:  Mucous membranes are moist.     Pharynx: No posterior oropharyngeal erythema.  Eyes:     Extraocular Movements: Extraocular movements intact.     Pupils: Pupils are equal, round, and reactive to light.  Cardiovascular:     Rate and Rhythm: Bradycardia present.     Pulses: Normal pulses.  Pulmonary:     Effort: Pulmonary effort is normal.     Breath sounds: Normal breath sounds.  Musculoskeletal:     Right lower leg: Edema present.     Left lower leg: Edema present.  Skin:    Capillary Refill: Capillary refill takes less than 2 seconds.  Neurological:     General: No focal deficit present.     Mental Status: She is alert.  Psychiatric:        Mood and Affect: Mood normal.         Behavior: Behavior normal.       Assessment/Plan: 1. Bradycardia Patient has symptomatic bradycardia with presyncopal episode in the past, patient also have neuropathy ABI are borderline we will need to have arterial studies - EKG 12-Lead - Ambulatory referral to Cardiology  2. Leg swelling Borderline ABI screening test we will schedule ultrasound arterial lower extremity duplex - POCT ABI Screening Pilot No Charge - US ARTERIAL LOWER EXTREMITY DUPLEX BILATERAL; Future  3. B12 deficiency Secondary B12 deficiency we will go ahead and start her on replacement therapy - B12 and Folate Panel  4. Claudication ([place]) - US ARTERIAL LOWER EXTREMITY DUPLEX BILATERAL; Future  5. SOB (shortness of breath) Symptomatic bradycardia with syncopal episode questionable hypoperfusion patient will get benefit from 2D echo and a cardiac evaluation might need Holter monitor as well - Ambulatory referral to Cardiology   General Counseling: javonne louissaint understanding of the findings of todays visit and agrees with plan of treatment. I have discussed any further diagnostic evaluation that may be needed or ordered today. We also reviewed her medications today. she has been encouraged to call the office with any questions or concerns that should arise related to todays visit.    Orders Placed This Encounter  Procedures   US ARTERIAL LOWER EXTREMITY DUPLEX BILATERAL   B12 and Folate Panel   Ambulatory referral to Cardiology   EKG 12-Lead   POCT ABI Screening Pilot No Charge    No orders of the defined types were placed in this encounter.   [place] Controlled Substance Database was reviewed by me.  Time spent:45 Minutes

## 2022-07-04 NOTE — Progress Notes (Signed)
Notes requested from the PCP physician.

## 2022-07-05 ENCOUNTER — Encounter: Payer: Self-pay | Admitting: Cardiology

## 2022-07-05 ENCOUNTER — Ambulatory Visit (INDEPENDENT_AMBULATORY_CARE_PROVIDER_SITE_OTHER): Payer: Medicaid Other | Admitting: Cardiology

## 2022-07-05 ENCOUNTER — Ambulatory Visit (INDEPENDENT_AMBULATORY_CARE_PROVIDER_SITE_OTHER): Payer: No Typology Code available for payment source

## 2022-07-05 VITALS — BP 122/78 | HR 48 | Ht 65.0 in | Wt 170.2 lb

## 2022-07-05 DIAGNOSIS — R001 Bradycardia, unspecified: Secondary | ICD-10-CM | POA: Insufficient documentation

## 2022-07-05 DIAGNOSIS — M7989 Other specified soft tissue disorders: Secondary | ICD-10-CM

## 2022-07-05 DIAGNOSIS — I739 Peripheral vascular disease, unspecified: Secondary | ICD-10-CM | POA: Insufficient documentation

## 2022-07-05 DIAGNOSIS — M792 Neuralgia and neuritis, unspecified: Secondary | ICD-10-CM

## 2022-07-05 DIAGNOSIS — R0609 Other forms of dyspnea: Secondary | ICD-10-CM | POA: Diagnosis not present

## 2022-07-05 NOTE — Patient Instructions (Signed)
Medication Instructions:   Your physician recommends that you continue on your current medications as directed. Please refer to the Current Medication list given to you today.   *If you need a refill on your cardiac medications before your next appointment, please call your pharmacy*   Lab Work: None ordered  If you have labs (blood work) drawn today and your tests are completely normal, you will receive your results only by: [place] (if you have MyChart) OR A paper copy in the mail If you have any lab test that is abnormal or we need to change your treatment, we will call you to review the results.   Testing/Procedures:  Your physician has requested that you have an echocardiogram in 3-4 weeks. Echocardiography is a painless test that uses sound waves to create images of your heart. It provides your doctor with information about the size and shape of your heart and how well your heart's chambers and valves are working. This procedure takes approximately one hour. There are no restrictions for this procedure.   Your physician has requested that you have a lower extremity venous reflux study. This test is an ultrasound of the veins in the legs or arms. It looks at venous blood flow that carries blood from the heart to the legs or arms. There are no restrictions or special instructions.  Your provider has ordered a heart monitor to wear for 7 days. This will be mailed to your home with instructions on placement. Once you have finished the time frame requested, you will return monitor in box provided.          Follow-Up: At [facility], you and your health needs are our priority.  As part of our continuing mission to provide you with exceptional heart care, we have created designated Provider Care Teams.  These Care Teams include your primary Cardiologist (physician) and Advanced Practice Providers (APPs -  Physician Assistants and Nurse Practitioners) who all work together to  provide you with the care you need, when you need it.  We recommend signing up for the patient portal called "MyChart".  Sign up information is provided on this After Visit Summary.  MyChart is used to connect with patients for Virtual Visits (Telemedicine).  Patients are able to view lab/test results, encounter notes, upcoming appointments, etc.  Non-urgent messages can be sent to your provider as well.   To learn more about what you can do with MyChart, go to [email or web address].    Your next appointment:   2 month(s)  The format for your next appointment:   In Person  Provider:   You may see Glenetta Hew, MD or one of the following Advanced Practice Providers on your designated Care Team:   Murray Hodgkins, NP Christell Faith, PA-C Cadence Kathlen Mody, [place]   Other Instructions N/A  Important Information About Sugar

## 2022-07-05 NOTE — Progress Notes (Signed)
Primary Care Provider: Jonetta Osgood, NP [facility] Cardiologist: None Electrophysiologist: None  Clinic Note: Chief Complaint  Patient presents with   Slow heart rate and shortness of breath    Patient states that her feet and legs swel and she notices that she has shortness of breath more especially when she lays down at night. Gained 15 pounds in a month or two. Meds reviewed with patient.    New Patient (Initial Visit)   ===================================  ASSESSMENT/PLAN   Problem List Items Addressed This Visit     Bradycardia with 41-50 beats per minute - Primary    She complains of having lower heart rates with some fatigue etc., but she says that her heart rate goes up okay when she exercises.  Need to confirm or deny presence of true symptomatic bradycardia.  Simply resting heart rates in the 40s is not indication for anything more than just avoiding beta-blockers or nondihydropyridine calcium channel blockers  We will check a Zio patch monitor but also asked her to continue to walk and see if she is able to get her heart rate up with exercise.      Relevant Orders   LONG TERM MONITOR (3-14 DAYS)   Claudication of lower extremity (HCC)    Unusual sounding leg pain that may or may not happen with exertion.  Closely her PCP had ordered lower extremity arterial Dopplers.  Based on the swelling we will also consider this is due to venous stasis,. I did not order lower STEMI arterial Dopplers because apparently ABIs and Dopplers have not ordered by PCP.  This could slow down her activity but it would not cause symptoms of volume overload center.      Relevant Orders   VAS [place] LOWER EXTREMITY VENOUS REFLUX   DOE (dyspnea on exertion)    She is quite deconditioned.  Is not really all that much in the way of any significant medications.  Resting heart rate and blood pressure look pretty good.  No obvious evidence of A-fib.  Were checked in an echocardiogram and  lower extremity Dopplers along with long-term monitoring to exclude arrhythmias.      Relevant Orders   ECHOCARDIOGRAM COMPLETE   Leg swelling    I will really see much in the way of any swelling on exam, but she says it comes and goes.  Most pretty bad last week. Is not unilateral, it is bilateral, better with foot elevation.  Plan:  Check 2D echocardiogram to evaluate CHF given the time she is also has exertional dyspnea. Lower extremity venous Dopplers to exclude reflux which will also look for DVT.       Relevant Orders   ECHOCARDIOGRAM COMPLETE   VAS [place] LOWER EXTREMITY VENOUS REFLUX   Peripheral neuralgia    Lots of work symptoms probably related to her neuralgia and other aches and pains. Really hard to figure out what her symptoms are.       ===================================  HPI:    Nicole Rich is a 61 y.o. female with no prior cardiac history, but family history notable for mother and father with CHF (and father with CAD but at an older age) who is being seen today in consultation for the evaluation of BRADYCARDIA, LEG SWELLING, CLAUDICATION, SHORTNESS OF BREATH at the request of Lavera Guise, MD.  Chauncey Reading Blish was just seen yesterday by her PCP with the above complaints.  She is now referred for cardiology visit the next day.  Recent  Hospitalizations: None  Reviewed  CV studies:    The following studies were reviewed today: (if available, images/films reviewed: From Epic Chart or Care Everywhere) POC ABIs done 06/28/2022: R ABI one 1.12, L ABI 1.09  Interval History:   Nicole Rich presents here for cardiology evaluation with a litany of complaints.  We reviewed her family history her mother had CHF but died at 71 of complications of lung cancer.  Father has CHF with history of 2 heart attacks and PCI but is currently 8.  None of the conditions were at a young age besides her mother CHF which is probably related to the cancer.  She said that about 4  months ago she had a passout or falling episode and since then she has been having a lot of lightheadedness and dizziness.  She certainly cannot confirm or deny loss of consciousness.  One of these was noted that she had lowish blood pressures, notably on her home monitor but as well as what she is saw her PCP.  She denied any sensation of rapid irregular heartbeats or palpitations but she does state that her heart rate will go up pretty high when she does relatively routine teen activity.  He will go up certainly when she does activity to the 130s.  She has diffuse strength issues such as discomfort in her legs with aching and burning when she exerts herself.  She also has off-and-on bilateral upper chest pain that happens mostly at rest not much with exertion..  She does note some orthopnea but not really PND.  She also complains of hand and foot swelling and reports a roughly 15 pound weight gain in a couple months.  She notes exertional dyspnea, but not really exertional chest pain.  She does not think that her fall was a single episode never has not really had any syncope or near syncope.  No real TIA or amaurosis fugax symptoms.  She has leg pain and an aching that is with or without walking.  It is more of a numb aching and tingling.  CV Review of Symptoms (Summary) Cardiovascular ROS: positive for - dyspnea on exertion, edema, orthopnea, and sharp nonexertional chest pains.  Fatigue and dizziness concerns with slow heart rate. negative for - irregular heartbeat, loss of consciousness, paroxysmal nocturnal dyspnea, rapid heart rate, or syncope/near syncope or TIA/amaurosis fugax, claudication  REVIEWED OF SYSTEMS   Review of Systems  Constitutional:  Positive for malaise/fatigue. Negative for weight loss (Weight gain).  HENT:  Positive for congestion. Negative for nosebleeds.   Respiratory:  Positive for cough and shortness of breath. Negative for hemoptysis, sputum production and wheezing.    Cardiovascular:  Positive for chest pain, orthopnea and leg swelling. Negative for PND.  Gastrointestinal:  Negative for abdominal pain, blood in stool and melena.  Genitourinary:  Negative for flank pain, frequency and hematuria.  Musculoskeletal:  Positive for falls (Per HPI), joint pain and neck pain. Negative for myalgias.  Neurological:  Positive for dizziness and headaches. Negative for focal weakness, loss of consciousness and weakness.  Psychiatric/Behavioral:  Negative for depression and memory loss. The patient is nervous/anxious and has insomnia.    I have reviewed and (if needed) personally updated the patient's problem list, medications, allergies, past medical and surgical history, social and family history.   PAST MEDICAL HISTORY   Past Medical History:  Diagnosis Date   Anxiety    Arthritis    neck   Family history of adverse reaction to anesthesia  dad can only stay sedated for a certain amount of time per pt   Foot drop, right foot    WEARS BRACE   GERD (gastroesophageal reflux disease)    History of hiatal hernia    History of kidney stones    Peripheral neuropathy    TMJ (dislocation of temporomandibular joint)     PAST SURGICAL HISTORY   Past Surgical History:  Procedure Laterality Date   BREAST BIOPSY Left 1996   NEG   CESAREAN SECTION     CYSTOSCOPY/URETEROSCOPY/HOLMIUM LASER/STENT PLACEMENT Right 08/13/2018   Procedure: CYSTOSCOPY/URETEROSCOPY/HOLMIUM LASER/STENT PLACEMENT;  Surgeon: Hollice Espy, MD;  Location: [facility] ORS;  Service: Urology;  Laterality: Right;   FOOT SURGERY Right    mortons neuroma   TUBAL LIGATION     UMBILICAL HERNIA REPAIR      Immunization History  Administered Date(s) Administered   Influenza Inj Mdck Quad Pf 09/03/2018   Influenza,inj,Quad PF,6+ Mos 10/31/2020   Influenza-Unspecified 08/14/2021   Janssen (J&J) SARS-COV-2 Vaccination 10/31/2020, 12/31/2020    MEDICATIONS/ALLERGIES   Current Meds  Medication Sig    ALPRAZolam (XANAX) 0.5 MG tablet Take 1 tablet (0.5 mg total) by mouth 2 (two) times daily as needed for anxiety.   carbamazepine (TEGRETOL) 200 MG tablet Take 1 tablet (200 mg total) by mouth 2 (two) times daily.   Cyanocobalamin (VITAMIN B 12 PO) Take 1 tablet by mouth daily.   mupirocin ointment (BACTROBAN) 2 % Apply 1 application topically 2 (two) times daily.   pregabalin (LYRICA) 50 MG capsule Take 1 capsule (50 mg total) by mouth 3 (three) times daily.    No Known Allergies  SOCIAL HISTORY/FAMILY HISTORY   Reviewed in Epic:   Social History   Tobacco Use   Smoking status: Former    Packs/day: 0.50    Years: 40.00    Total pack years: 20.00    Types: Cigarettes    Quit date: 08/06/2016    Years since quitting: 5.9   Smokeless tobacco: Never  Vaping Use   Vaping Use: Some days  Substance Use Topics   Alcohol use: No   Drug use: No   Social History   Social History Narrative   Not on file   Family History  Problem Relation Age of Onset   Cancer Mother    Diabetes Father    Hypertension Father    Congestive Heart Failure Sister    Cancer Brother    Breast cancer Neg Hx   -Reviewed  OBJCTIVE -PE, EKG, labs   Wt Readings from Last 3 Encounters:  07/05/22 170 lb 3.2 oz (77.2 kg)  06/28/22 173 lb 6.4 oz (78.7 kg)  04/23/22 173 lb (78.5 kg)    Physical Exam: BP 122/78 (BP Location: Right Arm, Patient Position: Sitting, Cuff Size: Normal)   Pulse (!) 48   Ht '5\' 5"'$  (1.651 m)   Wt 170 lb 3.2 oz (77.2 kg)   SpO2 97%   BMI 28.32 kg/m  Physical Exam Vitals reviewed.  Constitutional:      General: She is not in acute distress.    Appearance: Normal appearance. She is normal weight. She is not ill-appearing or toxic-appearing.     Comments: Well-groomed.  Relatively healthy appearing.  HENT:     Head: Normocephalic and atraumatic.  Cardiovascular:     Rate and Rhythm: Regular rhythm. Bradycardia present. No extrasystoles are present.    Chest Wall: PMI is  not displaced.     Pulses: Decreased pulses.  Heart sounds: S1 normal and S2 normal. No murmur (Cannot exclude soft SEM, but does not sound like a true murmur.) heard.    Friction rub present. No gallop.  Pulmonary:     Effort: Pulmonary effort is normal. No respiratory distress.     Breath sounds: Normal breath sounds. No wheezing, rhonchi or rales.  Chest:     Chest wall: No tenderness.  Abdominal:     General: Abdomen is flat. Bowel sounds are normal. There is no distension.     Palpations: Abdomen is soft. There is no mass.     Tenderness: There is no abdominal tenderness. There is no guarding or rebound.     Comments: Mildly protuberant, obese abdomen.  No HSM or bruit.  Musculoskeletal:        General: No swelling. Normal range of motion.  Skin:    General: Skin is warm and dry.  Neurological:     General: No focal deficit present.     Mental Status: She is alert and oriented to person, place, and time.     Gait: Gait normal.  Psychiatric:        Mood and Affect: Mood normal.        Behavior: Behavior normal.        Thought Content: Thought content normal.        Judgment: Judgment normal.     Adult ECG Report  Rate: 48;  Rhythm: sinus bradycardia and otherwise normal axis, intervals and durations. ;   Narrative Interpretation: Otherwise stable  Recent Labs: Lipid panel, CMP and CBC as well as B12 etc. was ordered back in January, but not drawn.  Lab Results  Component Value Date   CHOL 225 (H) 05/04/2020   HDL 62 05/04/2020   LDLCALC 143 (H) 05/04/2020   TRIG 111 05/04/2020   Lab Results  Component Value Date   CREATININE 0.70 05/04/2020   BUN 14 05/04/2020   NA 136 05/04/2020   K 4.2 05/04/2020   CL 103 05/04/2020   CO2 21 05/04/2020      Latest Ref Rng & Units 05/04/2020   11:32 AM 07/12/2018    1:10 PM 09/01/2016   12:21 PM  CBC  WBC 3.4 - 10.8 x10E3/uL 7.9  18.4  15.1   Hemoglobin 11.1 - 15.9 g/dL 14.2  16.6  14.0   Hematocrit 34.0 - 46.6 % 41.1   49.2  40.6   Platelets 150 - 450 x10E3/uL 281  295  220     No results found for: "HGBA1C" Lab Results  Component Value Date   TSH 1.880 05/04/2020    ==================================================  COVID-19 Education: The signs and symptoms of COVID-19 were discussed with the patient and how to seek care for testing (follow up with PCP or arrange E-visit).    I spent a total of 22 minutes with the patient spent in direct patient consultation.  Additional time spent with chart review  / charting (studies, outside notes, etc): 25 min Total Time: 47 min  Current medicines are reviewed at length with the patient today.  (+/- concerns) N/A  This visit occurred during the SARS-CoV-2 public health emergency.  Safety protocols were in place, including screening questions prior to the visit, additional usage of staff PPE, and extensive cleaning of exam room while observing appropriate contact time as indicated for disinfecting solutions.  Notice: This dictation was prepared with Dragon dictation along with smart phrase technology. Any transcriptional errors that result from this process are unintentional  and may not be corrected upon review.   Studies Ordered:  Orders Placed This Encounter  Procedures   LONG TERM MONITOR (3-14 DAYS)   ECHOCARDIOGRAM COMPLETE   VAS [place] LOWER EXTREMITY VENOUS REFLUX   No orders of the defined types were placed in this encounter.   Patient Instructions / Medication Changes & Studies & Tests Ordered   Patient Instructions  Medication Instructions:   Your physician recommends that you continue on your current medications as directed. Please refer to the Current Medication list given to you today.   *If you need a refill on your cardiac medications before your next appointment, please call your pharmacy*   Lab Work: None ordered  If you have labs (blood work) drawn today and your tests are completely normal, you will receive your results only  by: [place] (if you have MyChart) OR A paper copy in the mail If you have any lab test that is abnormal or we need to change your treatment, we will call you to review the results.   Testing/Procedures:  Your physician has requested that you have an echocardiogram in 3-4 weeks. Echocardiography is a painless test that uses sound waves to create images of your heart. It provides your doctor with information about the size and shape of your heart and how well your heart's chambers and valves are working. This procedure takes approximately one hour. There are no restrictions for this procedure.   Your physician has requested that you have a lower extremity venous reflux study. This test is an ultrasound of the veins in the legs or arms. It looks at venous blood flow that carries blood from the heart to the legs or arms. There are no restrictions or special instructions.  Your provider has ordered a heart monitor to wear for 7 days. This will be mailed to your home with instructions on placement. Once you have finished the time frame requested, you will return monitor in box provided.          Follow-Up: At [facility] you and your health needs are our priority.  As part of our continuing mission to provide you with exceptional heart care, we have created designated Provider Care Teams.  These Care Teams include your primary Cardiologist (physician) and Advanced Practice Providers (APPs -  Physician Assistants and Nurse Practitioners) who all work together to provide you with the care you need, when you need it.  We recommend signing up for the patient portal called "MyChart".  Sign up information is provided on this After Visit Summary.  MyChart is used to connect with patients for Virtual Visits (Telemedicine).  Patients are able to view lab/test results, encounter notes, upcoming appointments, etc.  Non-urgent messages can be sent to your provider as well.   To learn more about what  you can do with MyChart, go to [email or web address].    Your next appointment:   2 month(s)  The format for your next appointment:   In Person  Provider:   You may see Glenetta Hew, MD or one of the following Advanced Practice Providers on your designated Care Team:   Murray Hodgkins, NP Christell Faith, PA-C Cadence Kathlen Mody, [place]   Other Instructions N/A  Important Information About [place]          Glenetta Hew, M.D., M.S. Interventional Cardiologist   Pager # [phone] Phone # [phone] [address] [place], [place] [address]   Thank you for choosing [facility] [place]!!

## 2022-07-07 ENCOUNTER — Encounter: Payer: Self-pay | Admitting: Cardiology

## 2022-07-07 NOTE — Assessment & Plan Note (Signed)
Unusual sounding leg pain that may or may not happen with exertion.  Closely her PCP had ordered lower extremity arterial Dopplers.  Based on the swelling we will also consider this is due to venous stasis,. I did not order lower STEMI arterial Dopplers because apparently ABIs and Dopplers have not ordered by PCP.  This could slow down her activity but it would not cause symptoms of volume overload center.

## 2022-07-07 NOTE — Assessment & Plan Note (Signed)
Lots of work symptoms probably related to her neuralgia and other aches and pains. Really hard to figure out what her symptoms are.

## 2022-07-07 NOTE — Assessment & Plan Note (Signed)
She complains of having lower heart rates with some fatigue etc., but she says that her heart rate goes up okay when she exercises.  Need to confirm or deny presence of true symptomatic bradycardia.  Simply resting heart rates in the 40s is not indication for anything more than just avoiding beta-blockers or nondihydropyridine calcium channel blockers  We will check a Zio patch monitor but also asked her to continue to walk and see if she is able to get her heart rate up with exercise.

## 2022-07-07 NOTE — Assessment & Plan Note (Signed)
She is quite deconditioned.  Is not really all that much in the way of any significant medications.  Resting heart rate and blood pressure look pretty good.  No obvious evidence of A-fib.  Were checked in an echocardiogram and lower extremity Dopplers along with long-term monitoring to exclude arrhythmias.

## 2022-07-07 NOTE — Assessment & Plan Note (Signed)
I will really see much in the way of any swelling on exam, but she says it comes and goes.  Most pretty bad last week. Is not unilateral, it is bilateral, better with foot elevation.  Plan:   Check 2D echocardiogram to evaluate CHF given the time she is also has exertional dyspnea.  Lower extremity venous Dopplers to exclude reflux which will also look for DVT.

## 2022-07-08 DIAGNOSIS — R001 Bradycardia, unspecified: Secondary | ICD-10-CM

## 2022-07-10 NOTE — Addendum Note (Signed)
Addended by: James Ivanoff D on: 07/10/2022 09:51 AM   Modules accepted: Orders

## 2022-07-16 ENCOUNTER — Encounter: Payer: Self-pay | Admitting: Cardiology

## 2022-07-16 ENCOUNTER — Ambulatory Visit: Payer: Medicaid Other

## 2022-07-16 DIAGNOSIS — M7989 Other specified soft tissue disorders: Secondary | ICD-10-CM | POA: Diagnosis not present

## 2022-07-16 DIAGNOSIS — I739 Peripheral vascular disease, unspecified: Secondary | ICD-10-CM | POA: Diagnosis not present

## 2022-07-23 ENCOUNTER — Ambulatory Visit (INDEPENDENT_AMBULATORY_CARE_PROVIDER_SITE_OTHER): Payer: Medicaid Other | Admitting: Nurse Practitioner

## 2022-07-23 ENCOUNTER — Encounter: Payer: Self-pay | Admitting: Nurse Practitioner

## 2022-07-23 VITALS — BP 117/71 | HR 51 | Temp 97.8°F | Resp 16 | Ht 65.0 in | Wt 171.2 lb

## 2022-07-23 DIAGNOSIS — G500 Trigeminal neuralgia: Secondary | ICD-10-CM

## 2022-07-23 DIAGNOSIS — R001 Bradycardia, unspecified: Secondary | ICD-10-CM

## 2022-07-23 DIAGNOSIS — F419 Anxiety disorder, unspecified: Secondary | ICD-10-CM | POA: Diagnosis not present

## 2022-07-23 MED ORDER — ALPRAZOLAM 0.5 MG PO TABS: 0.5000 mg | ORAL_TABLET | Freq: Two times a day (BID) | ORAL | 2 refills | Status: DC | PRN

## 2022-07-23 MED ORDER — PREGABALIN 50 MG PO CAPS: 50.0000 mg | ORAL_CAPSULE | Freq: Three times a day (TID) | ORAL | 2 refills | Status: DC

## 2022-07-23 NOTE — Progress Notes (Signed)
[facility] [place], [place] [address]  Internal MEDICINE  Office Visit Note  Patient Name: Nicole Rich  [id number]  [id number]  Date of Service: 07/23/2022  Chief Complaint  Patient presents with   Follow-up   Medication Refill   Gastroesophageal Reflux    HPI Rita presents for follow-up visit for medication refill and adjustment, trigeminal neuralgia and GERD. --Restarted on tegretol, has been doing well but feels more tired sometimes and has intermittent dizziness, side effects of Tegretol have been discussed at length multiple times and the patient acknowledges these risks and is aware of the side effects and is being careful not to fall --Renewal for handicap placard completed --need med refills sent  -- Due to abnormal ABI reading and claudication of the lower extremities, ultrasound of the bilateral lower extremity arteries was performed and there were no blockages or stenosis and the ultrasound was interpreted as normal -- She has a low heart rate and is already seeing cardiologist Dr. Ellyn Hack who is monitoring this   Current Medication: Outpatient Encounter Medications as of 07/23/2022  Medication Sig   carbamazepine (TEGRETOL) 200 MG tablet Take 1 tablet (200 mg total) by mouth 2 (two) times daily.   Cyanocobalamin (VITAMIN B 12 PO) Take 1 tablet by mouth daily.   mupirocin ointment (BACTROBAN) 2 % Apply 1 application topically 2 (two) times daily.   Omega-3 Fatty Acids (FISH OIL PO) Take 4,000 mg by mouth daily.   OVER THE COUNTER MEDICATION Take 2 capsules by mouth 2 (two) times daily.   [DISCONTINUED] ALPRAZolam (XANAX) 0.5 MG tablet Take 1 tablet (0.5 mg total) by mouth 2 (two) times daily as needed for anxiety.   [DISCONTINUED] pregabalin (LYRICA) 50 MG capsule Take 1 capsule (50 mg total) by mouth 3 (three) times daily.   ALPRAZolam (XANAX) 0.5 MG tablet Take 1 tablet (0.5 mg total) by mouth 2 (two) times daily as needed for anxiety.    pregabalin (LYRICA) 50 MG capsule Take 1 capsule (50 mg total) by mouth 3 (three) times daily.   No facility-administered encounter medications on file as of 07/23/2022.    Surgical History: Past Surgical History:  Procedure Laterality Date   BREAST BIOPSY Left 1996   NEG   CESAREAN SECTION     CYSTOSCOPY/URETEROSCOPY/HOLMIUM LASER/STENT PLACEMENT Right 08/13/2018   Procedure: CYSTOSCOPY/URETEROSCOPY/HOLMIUM LASER/STENT PLACEMENT;  Surgeon: Hollice Espy, MD;  Location: [facility] ORS;  Service: Urology;  Laterality: Right;   FOOT SURGERY Right    mortons neuroma   TUBAL LIGATION     UMBILICAL HERNIA REPAIR      Medical History: Past Medical History:  Diagnosis Date   Anxiety    Arthritis    neck   Family history of adverse reaction to anesthesia    dad can only stay sedated for a certain amount of time per pt   Foot drop, right foot    WEARS BRACE   GERD (gastroesophageal reflux disease)    History of hiatal hernia    History of kidney stones    Peripheral neuropathy    TMJ (dislocation of temporomandibular joint)     Family History: Family History  Problem Relation Age of Onset   Cancer Mother    Diabetes Father    Hypertension Father    Congestive Heart Failure Sister    Cancer Brother    Breast cancer Neg Hx     Social History   Socioeconomic History   Marital status: Married    Spouse  name: Not on file   Number of children: Not on file   Years of education: Not on file   Highest education level: Not on file  Occupational History   Not on file  Tobacco Use   Smoking status: Former    Packs/day: 0.50    Years: 40.00    Total pack years: 20.00    Types: Cigarettes    Quit date: 08/06/2016    Years since quitting: 5.9   Smokeless tobacco: Never  Vaping Use   Vaping Use: Some days  Substance and Sexual Activity   Alcohol use: No   Drug use: No   Sexual activity: Yes  Other Topics Concern   Not on file  Social History Narrative   Not on file    Social Determinants of Health   Financial Resource Strain: Not on file  Food Insecurity: Not on file  Transportation Needs: Not on file  Physical Activity: Sufficiently Active (07/29/2018)   Exercise Vital Sign    Days of Exercise per Week: 7 days    Minutes of Exercise per Session: 60 min  Stress: No Stress Concern Present (07/29/2018)   [place]    Feeling of Stress : Not at all  Social Connections: Somewhat Isolated (07/29/2018)   Social Connection and Isolation Panel [NHANES]    Frequency of Communication with Friends and Family: Three times a week    Frequency of Social Gatherings with Friends and Family: Three times a week    Attends Religious Services: Never    Active Member of Clubs or Organizations: No    Attends Archivist Meetings: Never    Marital Status: Married  Human resources officer Violence: Not At Risk (07/29/2018)   Humiliation, Afraid, Rape, and Kick questionnaire    Fear of Current or Ex-Partner: No    Emotionally Abused: No    Physically Abused: No    Sexually Abused: No      Review of Systems  Constitutional:  Negative for fatigue and fever.  HENT:  Negative for congestion, mouth sores and postnasal drip.   Respiratory:  Negative for cough.   Cardiovascular:  Positive for leg swelling. Negative for chest pain.  Genitourinary:  Negative for flank pain.  Skin:  Positive for color change.  Neurological:  Positive for light-headedness.  Psychiatric/Behavioral: Negative.      Vital Signs: BP 117/71   Pulse (!) 51   Temp 97.8 F (36.6 C)   Resp 16   Ht '5\' 5"'$  (1.651 m)   Wt 171 lb 3.2 oz (77.7 kg)   SpO2 98%   BMI 28.49 kg/m    Physical Exam Vitals reviewed.  Constitutional:      Appearance: Normal appearance.  HENT:     Head: Normocephalic and atraumatic.  Eyes:     Pupils: Pupils are equal, round, and reactive to light.  Cardiovascular:     Rate and Rhythm: Regular  rhythm. Bradycardia present.     Pulses: Normal pulses.     Heart sounds: Normal heart sounds. No murmur heard. Pulmonary:     Effort: Pulmonary effort is normal.     Breath sounds: Normal breath sounds.  Musculoskeletal:     Right lower leg: Edema present.     Left lower leg: Edema present.  Neurological:     General: No focal deficit present.     Mental Status: She is alert and oriented to person, place, and time.  Psychiatric:  Mood and Affect: Mood normal.        Behavior: Behavior normal.        Assessment/Plan: 1. Trigeminal neuralgia of left side of face Stable and improved with Tegretol back on board patient is well aware of possible side effects of the medication and is taking ample precaution to prevent herself from falling or having any issues.  Lyrica refills ordered, follow-up in 3 months for additional refills unless needed sooner otherwise - pregabalin (LYRICA) 50 MG capsule; Take 1 capsule (50 mg total) by mouth 3 (three) times daily.  Dispense: 90 capsule; Refill: 2  2. Bradycardia Currently being followed by cardiology for bradycardia will continue to monitor situation via patient and cardiology visit notes  3. Anxiety disorder, unspecified type Anxiety is stable, patient takes alprazolam as needed for increased anxiety and panic attacks.  Refills ordered x3 months, follow-up in 3 months for additional refills unless needed sooner otherwise - ALPRAZolam (XANAX) 0.5 MG tablet; Take 1 tablet (0.5 mg total) by mouth 2 (two) times daily as needed for anxiety.  Dispense: 60 tablet; Refill: 2   General Counseling: makensey rego understanding of the findings of todays visit and agrees with plan of treatment. I have discussed any further diagnostic evaluation that may be needed or ordered today. We also reviewed her medications today. she has been encouraged to call the office with any questions or concerns that should arise related to todays visit.    No  orders of the defined types were placed in this encounter.   Meds ordered this encounter  Medications   ALPRAZolam (XANAX) 0.5 MG tablet    Sig: Take 1 tablet (0.5 mg total) by mouth 2 (two) times daily as needed for anxiety.    Dispense:  60 tablet    Refill:  2   pregabalin (LYRICA) 50 MG capsule    Sig: Take 1 capsule (50 mg total) by mouth 3 (three) times daily.    Dispense:  90 capsule    Refill:  2    Return in about 3 months (around 10/23/2022) for F/U, anxiety med refill, Jorgen Wolfinger PCP.   Total time spent:30 Minutes Time spent includes review of chart, medications, test results, and follow up plan with the patient.    Controlled Substance Database was reviewed by me.  This patient was seen by Jonetta Osgood, FNP-C in collaboration with Dr. Clayborn Bigness as a part of collaborative care agreement.   Voula Waln R. Valetta Fuller, MSN, FNP-C Internal medicine

## 2022-07-31 HISTORY — PX: OTHER SURGICAL HISTORY: SHX169

## 2022-08-07 ENCOUNTER — Ambulatory Visit (INDEPENDENT_AMBULATORY_CARE_PROVIDER_SITE_OTHER): Payer: Medicaid Other

## 2022-08-07 DIAGNOSIS — M7989 Other specified soft tissue disorders: Secondary | ICD-10-CM

## 2022-08-07 DIAGNOSIS — I739 Peripheral vascular disease, unspecified: Secondary | ICD-10-CM | POA: Diagnosis not present

## 2022-08-07 DIAGNOSIS — R0609 Other forms of dyspnea: Secondary | ICD-10-CM

## 2022-08-07 HISTORY — PX: TRANSTHORACIC ECHOCARDIOGRAM: SHX275

## 2022-08-07 LAB — ECHOCARDIOGRAM COMPLETE
AR max vel: 2.43 cm2
AV Area VTI: 2.77 cm2
AV Area mean vel: 2.46 cm2
AV Mean grad: 4 mmHg
AV Peak grad: 7.1 mmHg
Ao pk vel: 1.33 m/s
Area-P 1/2: 3.76 cm2
Calc EF: 71.6 %
S' Lateral: 2.8 cm
Single Plane A2C EF: 69 %
Single Plane A4C EF: 69.6 %

## 2022-08-09 ENCOUNTER — Telehealth: Payer: Self-pay | Admitting: Cardiology

## 2022-08-09 NOTE — Telephone Encounter (Signed)
1) Echocardiogram:  Peter M Martinique, MD  08/08/2022  8:21 AM EDT     This study demonstrates:  Echo is normal Medication changes / Follow up studies / Other recommendations:   None. Await Dr Allison Quarry recommendation.   Please send results to the PCP:  Jonetta Osgood, NP  Peter Martinique, MD 08/08/2022 8:21 AM    2) Venous Reflux: Peter M Martinique, MD  08/07/2022  4:58 PM EDT     This study demonstrates:  venous dopplers look good.  Medication changes / Follow up studies / Other recommendations:   None   Please send results to the PCP:  Jonetta Osgood, NP  Peter Martinique, MD 08/07/2022 4:57 PM

## 2022-08-09 NOTE — Telephone Encounter (Signed)
Attempted to call the patient. No answer- I left a detailed message of results on her identified voice mail (ok per DPR).   I asked that she call back with any further questions or concerns.  Advised that  Dr. Ellyn Hack is currently out of the office and her heart monitor has not been signed off at this time.

## 2022-08-25 ENCOUNTER — Encounter: Payer: Self-pay | Admitting: Nurse Practitioner

## 2022-09-06 ENCOUNTER — Ambulatory Visit: Payer: No Typology Code available for payment source | Admitting: Cardiology

## 2022-10-11 ENCOUNTER — Encounter: Payer: Self-pay | Admitting: Cardiology

## 2022-10-11 ENCOUNTER — Ambulatory Visit: Payer: Medicaid Other | Attending: Cardiology | Admitting: Cardiology

## 2022-10-11 VITALS — BP 106/60 | HR 64 | Ht 65.0 in | Wt 163.2 lb

## 2022-10-11 DIAGNOSIS — R001 Bradycardia, unspecified: Secondary | ICD-10-CM

## 2022-10-11 DIAGNOSIS — R0609 Other forms of dyspnea: Secondary | ICD-10-CM | POA: Diagnosis not present

## 2022-10-11 DIAGNOSIS — I739 Peripheral vascular disease, unspecified: Secondary | ICD-10-CM

## 2022-10-11 DIAGNOSIS — M7989 Other specified soft tissue disorders: Secondary | ICD-10-CM | POA: Diagnosis not present

## 2022-10-11 NOTE — Patient Instructions (Signed)
Medication Instructions:   Your physician recommends that you continue on your current medications as directed. Please refer to the Current Medication list given to you today.  Labwork:  none  Testing/Procedures:  none  Follow-Up:  Your physician recommends that you schedule a follow-up appointment in: as needed.  Any Other Special Instructions Will Be Listed Below (If Applicable).  If you need a refill on your cardiac medications before your next appointment, please call your pharmacy. 

## 2022-10-11 NOTE — Progress Notes (Signed)
Primary Care Provider: Jonetta Osgood, NP [place] Cardiologist: Glenetta Hew, MD Electrophysiologist: None  Clinic Note: Chief Complaint  Patient presents with   Follow-up    F/u echo/ LE [place] and zio. Meds reviewed verbally with pt.   ===================================  ASSESSMENT/PLAN   Problem List Items Addressed This Visit     Claudication of lower extremity ([place])    Symptoms did not sound very convincing for claudication, but evaluated with mostly arterial Dopplers showing no stenosis.      Leg swelling    No signs of either DVT or venous reflux. With normal echocardiogram, would suspect lower leg swelling is probably related to mild microvascular venous insufficiency.  Recommend foot elevation and support stockings.      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  Most of the bradycardia episodes were during hours of sleep.  Not overly concerning. No signs of chronotropic incompetence.  Would simply avoid AV nodal agents.  Unlikely to be causing any symptoms.      DOE (dyspnea on exertion) (Chronic)    Normal echo. Likely related to combination of deconditioning.  No signs of chronotropic incompetence.      ===================================  HPI:    Nicole Rich is a 61 y.o. female with a significant family history of CAD who presents today for 52-monthfollow-up after initial evaluation for Bradycardia, Leg Swelling, Claudication and Dyspnea on Exertion.  Nicole Rich was seen in initial consultation on 07/05/2022 at the request of Abernathy, Alyssa, NP-she presented with a litany complaints: Ordered LE V & A Dopplers, Echo & Zio Patch An episode of syncope roughly 4 months prior to presentation.  Had noticed lots of lightheadedness and dizziness since.  Also associated lower blood pressures.  No rapid irregular heartbeats or palpitations but  goes up fast with routine activity. Discomfort in legs with aching and burning with exertion Off-and-on bilateral upper chest pain happening at rest more than exertion. Orthopnea but no PND. 15 pound weight gain in couple months.  Associated exertional dyspnea.  Recent Hospitalizations: None  Reviewed  CV studies:    The following studies were reviewed today: (if available, images/films reviewed: From Epic Chart or Care Everywhere)  LE A Dopplers 07/16/2022: Bilat EIA, CFA, SFA, Pop A, TAT, PTA normal - no stenosis  LE V Dopplers 08/07/2022: No evidence of DVT in both lower extremities.  No evidence of superficial venous thrombosis or superficial venous reflux seen bilaterally.  TTE Echo 08/07/2022: EF 60 to 65%.  Normal LV function.  No RWMA.  Normal diastolic parameters.  Normal RV size and function.  Normal mitral and aortic valve.  Mildly elevated RAP-pretty normal study.  Zio Patch (14-day) July 2023:   Predominant underlying rhythm was Sinus Rhythm: HR range 41-117 bpm, AVG 66 bpm   4 atrial runs: Fastest 6 beats- HR range 156-193 bpm, Avg 173 bpm, 1.7 sec; Longest 9 beats HR Range 106-133 bpm / Avg 118 bpm, 5.15 sec.   Rare isolated PACs and PVCs (<1%).  Rare couplets.  No triplets.  Short episode of ventricular trigeminy noted.  No Sustained Arrhythmias: Atrial tachycardia (AT), supraventricular tachycardia (SVT), atrial fibrillation (A-fib), atrial flutter (A-flutter, ventricular tachycardia (VT).   No notable patient triggered events.   Interval History:   Nicole Rich here today overall doing fairly well. She denies any active chest pain or pressure.  Was happy to see the results of her arterial and venous studies.  We reviewed the results of her Zio patch showing short little bursts of atrial tachycardia but nothing prolonged.  This seems somewhat consistent with what she was feeling.  She was happy to see you with heart rates to low.  Most of the low heart rate spells  were in the evenings when sleeping.  She felt reassured by the results based on the fact the echocardiogram was normal.  Still notes some swelling in the legs.  Also lots of social stress.  She thinks a lot of the episodes are related to more distress than anything else.  Not really noticing claudication, just some leg aching. Still has some off-and-on sharp chest pains but nothing associate with exertion.  Some exertional dyspnea but acknowledges being deconditioned.  No PND orthopnea.  No syncope or near syncope, no TIA or amaurosis fugax.  No claudication.  REVIEWED OF SYSTEMS   Review of Systems  Constitutional:  Positive for malaise/fatigue and weight loss (Finally down 11 pounds from July.).  HENT:  Positive for congestion. Negative for nosebleeds.   Respiratory:  Positive for cough and shortness of breath.   Cardiovascular:        Per HPI  Gastrointestinal:  Negative for blood in stool and melena.  Genitourinary:  Negative for hematuria.  Musculoskeletal:  Positive for joint pain and neck pain. Negative for falls.  Neurological:  Positive for dizziness and headaches. Negative for focal weakness and weakness.  Psychiatric/Behavioral:  The patient is nervous/anxious and has insomnia.    I have reviewed and (if needed) personally updated the patient's problem list, medications, allergies, past medical and surgical history, social and family history.   PAST MEDICAL HISTORY   Past Medical History:  Diagnosis Date   Anxiety    Arthritis    neck   Family history of adverse reaction to anesthesia    dad can only stay sedated for a certain amount of time per pt   Foot drop, right foot    WEARS BRACE   GERD (gastroesophageal reflux disease)    History of hiatal hernia    History of kidney stones    Peripheral neuropathy    TMJ (dislocation of temporomandibular joint)     PAST SURGICAL HISTORY   Past Surgical History:  Procedure Laterality Date   BREAST BIOPSY Left 1996    NEG   CESAREAN SECTION     CYSTOSCOPY/URETEROSCOPY/HOLMIUM LASER/STENT PLACEMENT Right 08/13/2018   Procedure: CYSTOSCOPY/URETEROSCOPY/HOLMIUM LASER/STENT PLACEMENT;  Surgeon: Hollice Espy, MD;  Location: [facility] ORS;  Service: Urology;  Laterality: Right;   FOOT SURGERY Right    mortons neuroma   Lower Extremity Arterial and Venous Dopplers Bilateral 07/2022    LE A Dopplers 07/16/2022: Bilat EIA, CFA, SFA, Pop A, TAT, PTA normal - no stenosis; LE V Dopplers 08/07/2022: No evidence of DVT in both lower extremities.  No evidence of superficial venous thrombosis or superficial venous reflux seen bilaterally.   TRANSTHORACIC ECHOCARDIOGRAM  08/07/2022   EF 60 to 65%.  Normal LV function.  No RWMA.  Normal diastolic parameters.  Normal RV size and function.  Normal mitral and aortic valve.  Mildly elevated RAP-pretty normal study.   TUBAL LIGATION     UMBILICAL HERNIA REPAIR     Zio Patch Event Monitor (14-day)     Predominant underlying rhythm was Sinus Rhythm: HR range 41-117 bpm, AVG 66 bpm   4 atrial runs: Fastest 6 beats- HR range  156-193 bpm, Avg 173 bpm, 1.7 sec; Longest 9 beats HR Range 106-133 bpm / Avg 118 bpm, 5.15 sec.   Rare isolated PACs and PVCs (<1%).  Rare couplets.  No triplets.  Short episode of ventricular trigeminy noted.  No Sustained Arrhythmias: ATach, SVT, A-fib/A-flutter, V Tach    Immunization History  Administered Date(s) Administered   Influenza Inj Mdck Quad Pf 09/03/2018, 10/22/2022   Influenza,inj,Quad PF,6+ Mos 10/31/2020   Influenza-Unspecified 08/14/2021   Janssen (J&J) SARS-COV-2 Vaccination 10/31/2020, 12/31/2020    MEDICATIONS/ALLERGIES   Current Meds  Medication Sig   carbamazepine (TEGRETOL) 200 MG tablet Take 1 tablet (200 mg total) by mouth 2 (two) times daily.   Cyanocobalamin (VITAMIN B 12 PO) Take 1 tablet by mouth daily.   mupirocin ointment (BACTROBAN) 2 % Apply 1 application topically 2 (two) times daily.   OVER THE COUNTER MEDICATION Take 2  capsules by mouth 2 (two) times daily. Super digestive enzymes   [DISCONTINUED] ALPRAZolam (XANAX) 0.5 MG tablet Take 1 tablet (0.5 mg total) by mouth 2 (two) times daily as needed for anxiety.   [DISCONTINUED] pregabalin (LYRICA) 50 MG capsule Take 1 capsule (50 mg total) by mouth 3 (three) times daily.    No Known Allergies  SOCIAL HISTORY/FAMILY HISTORY   Reviewed in Epic:  Pertinent findings:  Social History   Tobacco Use   Smoking status: Former    Packs/day: 0.50    Years: 40.00    Total pack years: 20.00    Types: Cigarettes    Quit date: 08/06/2016    Years since quitting: 6.2   Smokeless tobacco: Never  Vaping Use   Vaping Use: Some days  Substance Use Topics   Alcohol use: No   Drug use: No   Social History   Social History Narrative   Not on file   We reviewed her family history her mother had CHF but died at 2 of complications of lung cancer.  Father has CHF with history of 2 heart attacks and PCI but is currently 32.  None of the conditions were at a young age besides her mother CHF which is probably related to the cancer.   OBJCTIVE -PE, EKG, labs   Wt Readings from Last 3 Encounters:  10/22/22 160 lb 9.6 oz (72.8 kg)  10/11/22 163 lb 4 oz (74 kg)  07/23/22 171 lb 3.2 oz (77.7 kg)    Physical Exam: BP 106/60 (BP Location: Left Arm, Patient Position: Sitting, Cuff Size: Normal)   Pulse 64   Ht '5\' 5"'$  (1.651 m)   Wt 163 lb 4 oz (74 kg)   SpO2 97%   BMI 27.17 kg/m  Physical Exam Vitals reviewed.  Constitutional:      General: She is not in acute distress.    Appearance: Normal appearance. She is normal weight. She is not ill-appearing (Healthy healthy-appearing.  Well-nourished and well-groomed.Marland Kitchen) or toxic-appearing.  HENT:     Head: Normocephalic and atraumatic.  Neck:     Vascular: No carotid bruit or JVD.  Cardiovascular:     Rate and Rhythm: Regular rhythm. Bradycardia present. No extrasystoles are present.    Chest Wall: PMI is not  displaced.     Pulses: Intact distal pulses. No decreased pulses (Diminished due to body habitus).     Heart sounds: S1 normal and S2 normal. Heart sounds are distant. No murmur (Cannot exclude soft SEM.) heard.    No friction rub. No gallop.  Pulmonary:     Effort: Pulmonary effort  is normal. No respiratory distress.     Breath sounds: Normal breath sounds. No stridor. No wheezing, rhonchi or rales.  Chest:     Chest wall: Tenderness present.  Musculoskeletal:        General: No swelling (Trivial swelling). Normal range of motion.     Cervical back: Normal range of motion and neck supple.  Neurological:     General: No focal deficit present.     Mental Status: She is alert and oriented to person, place, and time.     Cranial Nerves: No cranial nerve deficit.     Gait: Gait normal.  Psychiatric:        Mood and Affect: Mood normal.        Behavior: Behavior normal.        Thought Content: Thought content normal.        Judgment: Judgment normal.     Adult ECG Report Not checked  Recent Labs: Reviewed Lab Results  Component Value Date   CHOL 225 (H) 05/04/2020   HDL 62 05/04/2020   LDLCALC 143 (H) 05/04/2020   TRIG 111 05/04/2020   Lab Results  Component Value Date   CREATININE 0.70 05/04/2020   BUN 14 05/04/2020   NA 136 05/04/2020   K 4.2 05/04/2020   CL 103 05/04/2020   CO2 21 05/04/2020      Latest Ref Rng & Units 05/04/2020   11:32 AM 07/12/2018    1:10 PM 09/01/2016   12:21 PM  CBC  WBC 3.4 - 10.8 x10E3/uL 7.9  18.4  15.1   Hemoglobin 11.1 - 15.9 g/dL 14.2  16.6  14.0   Hematocrit 34.0 - 46.6 % 41.1  49.2  40.6   Platelets 150 - 450 x10E3/uL 281  295  220     No results found for: "HGBA1C" Lab Results  Component Value Date   TSH 1.880 05/04/2020    ================================================== I spent a total of 32 minutes with the patient spent in direct patient consultation.  Additional time spent with chart review  / charting (studies, outside  notes, etc): 14 min Total Time: 46 min  Current medicines are reviewed at length with the patient today.  (+/- concerns) none  Notice: This dictation was prepared with Dragon dictation along with smart phrase technology. Any transcriptional errors that result from this process are unintentional and may not be corrected upon review.  Studies Ordered:  No orders of the defined types were placed in this encounter.  No orders of the defined types were placed in this encounter.   Patient Instructions / Medication Changes & Studies & Tests Ordered   Patient Instructions  Medication Instructions:  Your physician recommends that you continue on your current medications as directed. Please refer to the Current Medication list given to you today.  Labwork: none  Testing/Procedures: none  Follow-Up: Your physician recommends that you schedule a follow-up appointment in: as needed.  Any Other Special Instructions Will Be Listed Below (If Applicable).  If you need a refill on your cardiac medications before your next appointment, please call your pharmacy.     Leonie Man, MD, MS Glenetta Hew, M.D., M.S. Interventional Cardiologist  [facility]   [address] [place], [place]  [address] [phone]           Fax [phone]    Thank you for choosing [facility] in [place]!!

## 2022-10-22 ENCOUNTER — Encounter: Payer: Self-pay | Admitting: Nurse Practitioner

## 2022-10-22 ENCOUNTER — Ambulatory Visit (INDEPENDENT_AMBULATORY_CARE_PROVIDER_SITE_OTHER): Payer: Medicaid Other | Admitting: Nurse Practitioner

## 2022-10-22 VITALS — BP 116/66 | HR 56 | Temp 98.1°F | Resp 16 | Ht 65.0 in | Wt 160.6 lb

## 2022-10-22 DIAGNOSIS — Z23 Encounter for immunization: Secondary | ICD-10-CM

## 2022-10-22 DIAGNOSIS — F419 Anxiety disorder, unspecified: Secondary | ICD-10-CM | POA: Diagnosis not present

## 2022-10-22 DIAGNOSIS — G500 Trigeminal neuralgia: Secondary | ICD-10-CM | POA: Diagnosis not present

## 2022-10-22 MED ORDER — PREGABALIN 50 MG PO CAPS: 50.0000 mg | ORAL_CAPSULE | Freq: Three times a day (TID) | ORAL | 2 refills | Status: DC

## 2022-10-22 MED ORDER — ALPRAZOLAM 0.5 MG PO TABS: 0.5000 mg | ORAL_TABLET | Freq: Two times a day (BID) | ORAL | 2 refills | Status: DC | PRN

## 2022-10-22 NOTE — Progress Notes (Signed)
[facility] [place], [place] [address]  Internal MEDICINE  Office Visit Note  Patient Name: Nicole Rich  [id number]  [id number]  Date of Service: 10/22/2022  Chief Complaint  Patient presents with   Follow-up    Follow up anxiety med    Gastroesophageal Reflux   HPI Nicole Rich presents for a follow up visit for trigeminal neuralgia and anxiety.  --doing much better since switching back to tegretol and discontinuing nortriptyline. Still taking pregabalin which is helping as well.  --anxiety is manageable, continues to take alprazolam as needed. Due for refills today.  --no other concerns or questions  --neuralgia is manageable, no new or worsening pain.  --requesting flu shot today.     Current Medication: Outpatient Encounter Medications as of 10/22/2022  Medication Sig   carbamazepine (TEGRETOL) 200 MG tablet Take 1 tablet (200 mg total) by mouth 2 (two) times daily.   Cyanocobalamin (VITAMIN B 12 PO) Take 1 tablet by mouth daily.   mupirocin ointment (BACTROBAN) 2 % Apply 1 application topically 2 (two) times daily.   OVER THE COUNTER MEDICATION Take 2 capsules by mouth 2 (two) times daily. Super digestive enzymes   [DISCONTINUED] ALPRAZolam (XANAX) 0.5 MG tablet Take 1 tablet (0.5 mg total) by mouth 2 (two) times daily as needed for anxiety.   [DISCONTINUED] pregabalin (LYRICA) 50 MG capsule Take 1 capsule (50 mg total) by mouth 3 (three) times daily.   ALPRAZolam (XANAX) 0.5 MG tablet Take 1 tablet (0.5 mg total) by mouth 2 (two) times daily as needed for anxiety.   pregabalin (LYRICA) 50 MG capsule Take 1 capsule (50 mg total) by mouth 3 (three) times daily.   No facility-administered encounter medications on file as of 10/22/2022.    Surgical History: Past Surgical History:  Procedure Laterality Date   BREAST BIOPSY Left 1996   NEG   CESAREAN SECTION     CYSTOSCOPY/URETEROSCOPY/HOLMIUM LASER/STENT PLACEMENT Right 08/13/2018   Procedure:  CYSTOSCOPY/URETEROSCOPY/HOLMIUM LASER/STENT PLACEMENT;  Surgeon: Hollice Espy, MD;  Location: [facility] ORS;  Service: Urology;  Laterality: Right;   FOOT SURGERY Right    mortons neuroma   TUBAL LIGATION     UMBILICAL HERNIA REPAIR      Medical History: Past Medical History:  Diagnosis Date   Anxiety    Arthritis    neck   Family history of adverse reaction to anesthesia    dad can only stay sedated for a certain amount of time per pt   Foot drop, right foot    WEARS BRACE   GERD (gastroesophageal reflux disease)    History of hiatal hernia    History of kidney stones    Peripheral neuropathy    TMJ (dislocation of temporomandibular joint)     Family History: Family History  Problem Relation Age of Onset   Cancer Mother    Diabetes Father    Hypertension Father    Congestive Heart Failure Sister    Cancer Brother    Breast cancer Neg Hx     Social History   Socioeconomic History   Marital status: Married    Spouse name: Not on file   Number of children: Not on file   Years of education: Not on file   Highest education level: Not on file  Occupational History   Not on file  Tobacco Use   Smoking status: Former    Packs/day: 0.50    Years: 40.00    Total pack years: 20.00  Types: Cigarettes    Quit date: 08/06/2016    Years since quitting: 6.2   Smokeless tobacco: Never  Vaping Use   Vaping Use: Some days  Substance and Sexual Activity   Alcohol use: No   Drug use: No   Sexual activity: Yes  Other Topics Concern   Not on file  Social History Narrative   Not on file   Social Determinants of Health   Financial Resource Strain: Not on file  Food Insecurity: Not on file  Transportation Needs: Not on file  Physical Activity: Sufficiently Active (07/29/2018)   Exercise Vital Sign    Days of Exercise per Week: 7 days    Minutes of Exercise per Session: 60 min  Stress: No Stress Concern Present (07/29/2018)   [place]    Feeling of Stress : Not at all  Social Connections: Somewhat Isolated (07/29/2018)   Social Connection and Isolation Panel [NHANES]    Frequency of Communication with Friends and Family: Three times a week    Frequency of Social Gatherings with Friends and Family: Three times a week    Attends Religious Services: Never    Active Member of Clubs or Organizations: No    Attends Archivist Meetings: Never    Marital Status: Married  Human resources officer Violence: Not At Risk (07/29/2018)   Humiliation, Afraid, Rape, and Kick questionnaire    Fear of Current or Ex-Partner: No    Emotionally Abused: No    Physically Abused: No    Sexually Abused: No      Review of Systems  Constitutional:  Negative for chills, fatigue and unexpected weight change.  HENT:  Negative for congestion, rhinorrhea, sneezing and sore throat.   Eyes:  Negative for redness.  Respiratory:  Negative for cough, chest tightness, shortness of breath and wheezing.   Cardiovascular: Negative.  Negative for chest pain and palpitations.  Gastrointestinal: Negative.  Negative for abdominal pain, constipation, diarrhea, nausea and vomiting.  Genitourinary:  Negative for dysuria and frequency.  Musculoskeletal:  Positive for arthralgias. Negative for back pain, joint swelling and neck pain.       Facial pain from trigeminal neuralgia, treated with pregabalin.   Skin:  Negative for rash.  Neurological: Negative.  Negative for tremors and numbness.  Hematological:  Negative for adenopathy. Does not bruise/bleed easily.  Psychiatric/Behavioral:  Negative for behavioral problems (Depression), sleep disturbance and suicidal ideas. The patient is nervous/anxious.     Vital Signs: BP 116/66   Pulse (!) 56   Temp 98.1 F (36.7 C)   Resp 16   Ht '5\' 5"'$  (1.651 m)   Wt 160 lb 9.6 oz (72.8 kg)   SpO2 95%   BMI 26.73 kg/m    Physical Exam Vitals reviewed.  Constitutional:       General: She is not in acute distress.    Appearance: Normal appearance. She is not ill-appearing.  Cardiovascular:     Rate and Rhythm: Normal rate and regular rhythm.     Pulses: Normal pulses.     Heart sounds: Normal heart sounds. No murmur heard. Pulmonary:     Effort: Pulmonary effort is normal. No respiratory distress.     Breath sounds: Normal breath sounds. No wheezing.  Skin:    General: Skin is warm and dry.     Capillary Refill: Capillary refill takes less than 2 seconds.  Neurological:     Mental Status: She is alert and oriented  to person, place, and time.  Psychiatric:        Mood and Affect: Mood is anxious.        Behavior: Behavior normal.        Assessment/Plan: 1. Trigeminal neuralgia of left side of face Continue tegretol and pregabalin as prescribed. Pregabalin refills ordered.  - pregabalin (LYRICA) 50 MG capsule; Take 1 capsule (50 mg total) by mouth 3 (three) times daily.  Dispense: 90 capsule; Refill: 2  2. Anxiety disorder, unspecified type Continue prn alprazolam as prescribed.  - ALPRAZolam (XANAX) 0.5 MG tablet; Take 1 tablet (0.5 mg total) by mouth 2 (two) times daily as needed for anxiety.  Dispense: 60 tablet; Refill: 2  3. Needs flu shot Flu shot administered in office today.  - Flu Vaccine MDCK QUAD PF   General Counseling: afomia blackley understanding of the findings of todays visit and agrees with plan of treatment. I have discussed any further diagnostic evaluation that may be needed or ordered today. We also reviewed her medications today. she has been encouraged to call the office with any questions or concerns that should arise related to todays visit.    Orders Placed This Encounter  Procedures   Flu Vaccine MDCK QUAD PF    Meds ordered this encounter  Medications   pregabalin (LYRICA) 50 MG capsule    Sig: Take 1 capsule (50 mg total) by mouth 3 (three) times daily.    Dispense:  90 capsule    Refill:  2   ALPRAZolam  (XANAX) 0.5 MG tablet    Sig: Take 1 tablet (0.5 mg total) by mouth 2 (two) times daily as needed for anxiety.    Dispense:  60 tablet    Refill:  2    Return in about 3 months (around 01/22/2023) for F/U, anxiety med refill, Rawad Bochicchio PCP.   Total time spent:30 Minutes Time spent includes review of chart, medications, test results, and follow up plan with the patient.   [place] Controlled Substance Database was reviewed by me.  This patient was seen by Jonetta Osgood, FNP-C in collaboration with Dr. Clayborn Bigness as a part of collaborative care agreement.   Lekeisha Arenas R. Valetta Fuller, MSN, FNP-C Internal medicine

## 2022-11-04 ENCOUNTER — Encounter: Payer: Self-pay | Admitting: Cardiology

## 2022-11-04 NOTE — Assessment & Plan Note (Addendum)
No signs of either DVT or venous reflux. With normal echocardiogram, would suspect lower leg swelling is probably related to mild microvascular venous insufficiency.  Recommend foot elevation and support stockings.

## 2022-11-04 NOTE — Assessment & Plan Note (Signed)
Normal echo. Likely related to combination of deconditioning.  No signs of chronotropic incompetence.

## 2022-11-04 NOTE — Assessment & Plan Note (Signed)
Symptoms did not sound very convincing for claudication, but evaluated with mostly arterial Dopplers showing no stenosis.

## 2022-11-04 NOTE — Assessment & Plan Note (Signed)
Zio patch monitor showed heart rate range of 41 to 117 bpm sinus rhythm.  Most of the bradycardia episodes were during hours of sleep.  Not overly concerning. No signs of chronotropic incompetence.  Would simply avoid AV nodal agents.  Unlikely to be causing any symptoms.

## 2022-11-28 ENCOUNTER — Ambulatory Visit (INDEPENDENT_AMBULATORY_CARE_PROVIDER_SITE_OTHER): Payer: Medicaid Other | Admitting: Nurse Practitioner

## 2022-11-28 ENCOUNTER — Encounter: Payer: Self-pay | Admitting: Nurse Practitioner

## 2022-11-28 VITALS — BP 113/65 | HR 60 | Temp 96.9°F | Resp 16 | Ht 65.0 in | Wt 162.8 lb

## 2022-11-28 DIAGNOSIS — R0602 Shortness of breath: Secondary | ICD-10-CM | POA: Diagnosis not present

## 2022-11-28 DIAGNOSIS — Z87891 Personal history of nicotine dependence: Secondary | ICD-10-CM

## 2022-11-28 NOTE — Progress Notes (Signed)
[facility] [place], [place] [address]  Internal MEDICINE  Office Visit Note  Patient Name: Nicole Rich  [id number]  [id number]  Date of Service: 11/28/2022  Chief Complaint  Patient presents with   lung cancer screening    HPI [name] presents for a follow up visit to discuss lung cancer screening.  1-1.5 ppd when she was smoking  More than 30 years pack history Quit smoking 9 years ago.  Michela Pitcher it has been a long time since her last lung cancer screening.      Current Medication: Outpatient Encounter Medications as of 11/28/2022  Medication Sig   ALPRAZolam (XANAX) 0.5 MG tablet Take 1 tablet (0.5 mg total) by mouth 2 (two) times daily as needed for anxiety.   carbamazepine (TEGRETOL) 200 MG tablet Take 1 tablet (200 mg total) by mouth 2 (two) times daily.   Cyanocobalamin (VITAMIN B 12 PO) Take 1 tablet by mouth daily.   mupirocin ointment (BACTROBAN) 2 % Apply 1 application topically 2 (two) times daily.   OVER THE COUNTER MEDICATION Take 2 capsules by mouth 2 (two) times daily. Super digestive enzymes   pregabalin (LYRICA) 50 MG capsule Take 1 capsule (50 mg total) by mouth 3 (three) times daily.   No facility-administered encounter medications on file as of 11/28/2022.    Surgical History: Past Surgical History:  Procedure Laterality Date   BREAST BIOPSY Left 1996   NEG   CESAREAN SECTION     CYSTOSCOPY/URETEROSCOPY/HOLMIUM LASER/STENT PLACEMENT Right 08/13/2018   Procedure: CYSTOSCOPY/URETEROSCOPY/HOLMIUM LASER/STENT PLACEMENT;  Surgeon: Hollice Espy, MD;  Location: [facility] ORS;  Service: Urology;  Laterality: Right;   FOOT SURGERY Right    mortons neuroma   Lower Extremity Arterial and Venous Dopplers Bilateral 07/2022    LE A Dopplers 07/16/2022: Bilat EIA, CFA, SFA, Pop A, TAT, PTA normal - no stenosis; LE V Dopplers 08/07/2022: No evidence of DVT in both lower extremities.  No evidence of superficial venous thrombosis or superficial  venous reflux seen bilaterally.   TRANSTHORACIC ECHOCARDIOGRAM  08/07/2022   EF 60 to 65%.  Normal LV function.  No RWMA.  Normal diastolic parameters.  Normal RV size and function.  Normal mitral and aortic valve.  Mildly elevated RAP-pretty normal study.   TUBAL LIGATION     UMBILICAL HERNIA REPAIR     Zio Patch Event Monitor (14-day)     Predominant underlying rhythm was Sinus Rhythm: HR range 41-117 bpm, AVG 66 bpm   4 atrial runs: Fastest 6 beats- HR range 156-193 bpm, Avg 173 bpm, 1.7 sec; Longest 9 beats HR Range 106-133 bpm / Avg 118 bpm, 5.15 sec.   Rare isolated PACs and PVCs (<1%).  Rare couplets.  No triplets.  Short episode of ventricular trigeminy noted.  No Sustained Arrhythmias: ATach, SVT, A-fib/A-flutter, V Tach    Medical History: Past Medical History:  Diagnosis Date   Anxiety    Arthritis    neck   Family history of adverse reaction to anesthesia    dad can only stay sedated for a certain amount of time per pt   Foot drop, right foot    WEARS BRACE   GERD (gastroesophageal reflux disease)    History of hiatal hernia    History of kidney stones    Peripheral neuropathy    TMJ (dislocation of temporomandibular joint)     Family History: Family History  Problem Relation Age of Onset   Cancer Mother    Diabetes Father  Hypertension Father    Congestive Heart Failure Sister    Cancer Brother    Breast cancer Neg Hx     Social History   Socioeconomic History   Marital status: Married    Spouse name: Not on file   Number of children: Not on file   Years of education: Not on file   Highest education level: Not on file  Occupational History   Not on file  Tobacco Use   Smoking status: Former    Packs/day: 0.50    Years: 40.00    Total pack years: 20.00    Types: Cigarettes    Quit date: 08/06/2016    Years since quitting: 6.3   Smokeless tobacco: Never  Vaping Use   Vaping Use: Some days  Substance and Sexual Activity   Alcohol use: No   Drug  use: No   Sexual activity: Yes  Other Topics Concern   Not on file  Social History Narrative   Not on file   Social Determinants of Health   Financial Resource Strain: Not on file  Food Insecurity: Not on file  Transportation Needs: Not on file  Physical Activity: Sufficiently Active (07/29/2018)   Exercise Vital Sign    Days of Exercise per Week: 7 days    Minutes of Exercise per Session: 60 min  Stress: No Stress Concern Present (07/29/2018)   [place]    Feeling of Stress : Not at all  Social Connections: Somewhat Isolated (07/29/2018)   Social Connection and Isolation Panel [NHANES]    Frequency of Communication with Friends and Family: Three times a week    Frequency of Social Gatherings with Friends and Family: Three times a week    Attends Religious Services: Never    Active Member of Clubs or Organizations: No    Attends Archivist Meetings: Never    Marital Status: Married  Human resources officer Violence: Not At Risk (07/29/2018)   Humiliation, Afraid, Rape, and Kick questionnaire    Fear of Current or Ex-Partner: No    Emotionally Abused: No    Physically Abused: No    Sexually Abused: No      Review of Systems  Constitutional:  Negative for chills, fatigue and unexpected weight change.  HENT:  Negative for congestion, rhinorrhea, sneezing and sore throat.   Eyes:  Negative for redness.  Respiratory:  Negative for cough, chest tightness, shortness of breath and wheezing.   Cardiovascular: Negative.  Negative for chest pain and palpitations.  Gastrointestinal: Negative.  Negative for abdominal pain, constipation, diarrhea, nausea and vomiting.  Genitourinary:  Negative for dysuria and frequency.  Musculoskeletal:  Positive for arthralgias. Negative for back pain, joint swelling and neck pain.       Facial pain from trigeminal neuralgia, treated with pregabalin.   Skin:  Negative for rash.   Neurological: Negative.  Negative for tremors and numbness.  Hematological:  Negative for adenopathy. Does not bruise/bleed easily.  Psychiatric/Behavioral:  Negative for behavioral problems (Depression), sleep disturbance and suicidal ideas. The patient is nervous/anxious.     Vital Signs: BP 113/65   Pulse 60   Temp (!) 96.9 F (36.1 C)   Resp 16   Ht '5\' 5"'$  (1.651 m)   Wt 162 lb 12.8 oz (73.8 kg)   SpO2 95%   BMI 27.09 kg/m    Physical Exam Vitals reviewed.  Constitutional:      General: She is not in acute distress.  Appearance: Normal appearance. She is not ill-appearing.  Cardiovascular:     Rate and Rhythm: Normal rate and regular rhythm.     Pulses: Normal pulses.     Heart sounds: Normal heart sounds. No murmur heard. Pulmonary:     Effort: Pulmonary effort is normal. No respiratory distress.     Breath sounds: Normal breath sounds. No wheezing.  Skin:    General: Skin is warm and dry.     Capillary Refill: Capillary refill takes less than 2 seconds.  Neurological:     Mental Status: She is alert and oriented to person, place, and time.  Psychiatric:        Mood and Affect: Mood is anxious.        Behavior: Behavior normal.        Assessment/Plan: 1. Shortness of breath Lung cancer screening referral ordered - Ambulatory Referral Lung Cancer Screening [facility]  2. Stopped smoking with greater than 30 pack year history Lung cancer screening referral ordered - Ambulatory Referral Lung Cancer Screening [facility]   General Counseling: dorethea strubel understanding of the findings of todays visit and agrees with plan of treatment. I have discussed any further diagnostic evaluation that may be needed or ordered today. We also reviewed her medications today. she has been encouraged to call the office with any questions or concerns that should arise related to todays visit.    Orders Placed This Encounter  Procedures   Ambulatory  Referral Lung Cancer Screening [facility]    No orders of the defined types were placed in this encounter.   Return for previously scheduled, F/U, Toran Murch PCP in december.   Total time spent:20 Minutes Time spent includes review of chart, medications, test results, and follow up plan with the patient.   [place] Controlled Substance Database was reviewed by me.  This patient was seen by Jonetta Osgood, FNP-C in collaboration with Dr. Clayborn Bigness as a part of collaborative care agreement.   Kyliana Standen R. Valetta Fuller, MSN, FNP-C Internal medicine

## 2023-01-02 ENCOUNTER — Telehealth: Payer: Self-pay | Admitting: Nurse Practitioner

## 2023-01-02 NOTE — Telephone Encounter (Signed)
Lvm for Langley Gauss with [facility] regarding referral-Toni

## 2023-01-10 ENCOUNTER — Encounter: Payer: Medicaid Other | Admitting: Nurse Practitioner

## 2023-01-17 ENCOUNTER — Ambulatory Visit: Payer: Medicaid Other | Admitting: Nurse Practitioner

## 2023-01-17 ENCOUNTER — Encounter: Payer: Self-pay | Admitting: Nurse Practitioner

## 2023-01-17 VITALS — BP 106/61 | HR 70 | Temp 96.8°F | Resp 16 | Ht 65.0 in | Wt 167.8 lb

## 2023-01-17 DIAGNOSIS — G629 Polyneuropathy, unspecified: Secondary | ICD-10-CM

## 2023-01-17 DIAGNOSIS — G500 Trigeminal neuralgia: Secondary | ICD-10-CM | POA: Diagnosis not present

## 2023-01-17 DIAGNOSIS — F419 Anxiety disorder, unspecified: Secondary | ICD-10-CM | POA: Diagnosis not present

## 2023-01-17 MED ORDER — METHOCARBAMOL 750 MG PO TABS: 750.0000 mg | ORAL_TABLET | Freq: Four times a day (QID) | ORAL | 2 refills | Status: DC | PRN

## 2023-01-17 MED ORDER — ALPRAZOLAM 0.5 MG PO TABS: 0.5000 mg | ORAL_TABLET | Freq: Two times a day (BID) | ORAL | 2 refills | Status: DC | PRN

## 2023-01-17 NOTE — Progress Notes (Signed)
[facility] [place], [place] [address]  Internal MEDICINE  Office Visit Note  Patient Name: SHAKILA MAK  [id number]  [id number]  Date of Service: 01/17/2023  Chief Complaint  Patient presents with   Follow-up    HPI Nella presents for a follow-up visit for anxiety, neuropathy, and trigeminal neuralgia Anxiety -- alprazolam refills, stable, alprazolam works well.  Neurpathy -- ice pick pains, requests muscle releaxer, comes in different spots, randomly Trigeminal neuralgia -- avoids triggers esp cold temps, but doing ok with carbamazapine most of the time.     Current Medication: Outpatient Encounter Medications as of 01/17/2023  Medication Sig   carbamazepine (TEGRETOL) 200 MG tablet Take 1 tablet (200 mg total) by mouth 2 (two) times daily.   Cyanocobalamin (VITAMIN B 12 PO) Take 1 tablet by mouth daily.   methocarbamol (ROBAXIN) 750 MG tablet Take 1 tablet (750 mg total) by mouth 4 (four) times daily as needed for muscle spasms.   mupirocin ointment (BACTROBAN) 2 % Apply 1 application topically 2 (two) times daily.   OVER THE COUNTER MEDICATION Take 2 capsules by mouth 2 (two) times daily. Super digestive enzymes   pregabalin (LYRICA) 50 MG capsule Take 1 capsule (50 mg total) by mouth 3 (three) times daily.   [DISCONTINUED] ALPRAZolam (XANAX) 0.5 MG tablet Take 1 tablet (0.5 mg total) by mouth 2 (two) times daily as needed for anxiety.   ALPRAZolam (XANAX) 0.5 MG tablet Take 1 tablet (0.5 mg total) by mouth 2 (two) times daily as needed for anxiety.   No facility-administered encounter medications on file as of 01/17/2023.    Surgical History: Past Surgical History:  Procedure Laterality Date   BREAST BIOPSY Left 1996   NEG   CESAREAN SECTION     CYSTOSCOPY/URETEROSCOPY/HOLMIUM LASER/STENT PLACEMENT Right 08/13/2018   Procedure: CYSTOSCOPY/URETEROSCOPY/HOLMIUM LASER/STENT PLACEMENT;  Surgeon: Hollice Espy, MD;  Location: [facility] ORS;   Service: Urology;  Laterality: Right;   FOOT SURGERY Right    mortons neuroma   Lower Extremity Arterial and Venous Dopplers Bilateral 07/2022    LE A Dopplers 07/16/2022: Bilat EIA, CFA, SFA, Pop A, TAT, PTA normal - no stenosis; LE V Dopplers 08/07/2022: No evidence of DVT in both lower extremities.  No evidence of superficial venous thrombosis or superficial venous reflux seen bilaterally.   TRANSTHORACIC ECHOCARDIOGRAM  08/07/2022   EF 60 to 65%.  Normal LV function.  No RWMA.  Normal diastolic parameters.  Normal RV size and function.  Normal mitral and aortic valve.  Mildly elevated RAP-pretty normal study.   TUBAL LIGATION     UMBILICAL HERNIA REPAIR     Zio Patch Event Monitor (14-day)     Predominant underlying rhythm was Sinus Rhythm: HR range 41-117 bpm, AVG 66 bpm   4 atrial runs: Fastest 6 beats- HR range 156-193 bpm, Avg 173 bpm, 1.7 sec; Longest 9 beats HR Range 106-133 bpm / Avg 118 bpm, 5.15 sec.   Rare isolated PACs and PVCs (<1%).  Rare couplets.  No triplets.  Short episode of ventricular trigeminy noted.  No Sustained Arrhythmias: ATach, SVT, A-fib/A-flutter, V Tach    Medical History: Past Medical History:  Diagnosis Date   Anxiety    Arthritis    neck   Family history of adverse reaction to anesthesia    dad can only stay sedated for a certain amount of time per pt   Foot drop, right foot    WEARS BRACE   GERD (gastroesophageal reflux disease)  History of hiatal hernia    History of kidney stones    Peripheral neuropathy    TMJ (dislocation of temporomandibular joint)     Family History: Family History  Problem Relation Age of Onset   Cancer Mother    Diabetes Father    Hypertension Father    Congestive Heart Failure Sister    Cancer Brother    Breast cancer Neg Hx     Social History   Socioeconomic History   Marital status: Married    Spouse name: Not on file   Number of children: Not on file   Years of education: Not on file   Highest  education level: Not on file  Occupational History   Not on file  Tobacco Use   Smoking status: Former    Packs/day: 0.50    Years: 40.00    Total pack years: 20.00    Types: Cigarettes    Quit date: 08/06/2016    Years since quitting: 6.4   Smokeless tobacco: Never  Vaping Use   Vaping Use: Some days  Substance and Sexual Activity   Alcohol use: No   Drug use: No   Sexual activity: Yes  Other Topics Concern   Not on file  Social History Narrative   Not on file   Social Determinants of Health   Financial Resource Strain: Not on file  Food Insecurity: Not on file  Transportation Needs: Not on file  Physical Activity: Sufficiently Active (07/29/2018)   Exercise Vital Sign    Days of Exercise per Week: 7 days    Minutes of Exercise per Session: 60 min  Stress: No Stress Concern Present (07/29/2018)   [place]    Feeling of Stress : Not at all  Social Connections: Somewhat Isolated (07/29/2018)   Social Connection and Isolation Panel [NHANES]    Frequency of Communication with Friends and Family: Three times a week    Frequency of Social Gatherings with Friends and Family: Three times a week    Attends Religious Services: Never    Active Member of Clubs or Organizations: No    Attends Archivist Meetings: Never    Marital Status: Married  Human resources officer Violence: Not At Risk (07/29/2018)   Humiliation, Afraid, Rape, and Kick questionnaire    Fear of Current or Ex-Partner: No    Emotionally Abused: No    Physically Abused: No    Sexually Abused: No      Review of Systems  Constitutional:  Negative for chills, fatigue and unexpected weight change.  HENT:  Negative for congestion, rhinorrhea, sneezing and sore throat.   Eyes:  Negative for redness.  Respiratory:  Negative for cough, chest tightness, shortness of breath and wheezing.   Cardiovascular: Negative.  Negative for chest pain and  palpitations.  Gastrointestinal: Negative.  Negative for abdominal pain, constipation, diarrhea, nausea and vomiting.  Genitourinary:  Negative for dysuria and frequency.  Musculoskeletal:  Positive for arthralgias. Negative for back pain, joint swelling and neck pain.       Facial pain from trigeminal neuralgia, treated with carbamazapine  Skin:  Negative for rash.  Neurological: Negative.  Negative for tremors and numbness.  Hematological:  Negative for adenopathy. Does not bruise/bleed easily.  Psychiatric/Behavioral:  Negative for behavioral problems (Depression), sleep disturbance and suicidal ideas. The patient is nervous/anxious.     Vital Signs: BP 106/61   Pulse 70   Temp (!) 96.8 F (36 C)  Resp 16   Ht '5\' 5"'$  (1.651 m)   Wt 167 lb 12.8 oz (76.1 kg)   SpO2 96%   BMI 27.92 kg/m    Physical Exam Vitals reviewed.  Constitutional:      General: She is not in acute distress.    Appearance: Normal appearance. She is not ill-appearing.  Cardiovascular:     Rate and Rhythm: Normal rate and regular rhythm.     Pulses: Normal pulses.     Heart sounds: Normal heart sounds. No murmur heard. Pulmonary:     Effort: Pulmonary effort is normal. No respiratory distress.     Breath sounds: Normal breath sounds. No wheezing.  Skin:    General: Skin is warm and dry.     Capillary Refill: Capillary refill takes less than 2 seconds.  Neurological:     Mental Status: She is alert and oriented to person, place, and time.  Psychiatric:        Mood and Affect: Mood is anxious.        Behavior: Behavior normal.        Assessment/Plan: 1. Neuropathy May start methocarbamol prn as prescribed.  - methocarbamol (ROBAXIN) 750 MG tablet; Take 1 tablet (750 mg total) by mouth 4 (four) times daily as needed for muscle spasms.  Dispense: 120 tablet; Refill: 2  2. Trigeminal neuralgia of left side of face Continue carbamazapine as prescribed.   3. Anxiety disorder, unspecified  type Stable, continue alprazolam as prescribed.  - ALPRAZolam (XANAX) 0.5 MG tablet; Take 1 tablet (0.5 mg total) by mouth 2 (two) times daily as needed for anxiety.  Dispense: 60 tablet; Refill: 2   General Counseling: memory heinrichs understanding of the findings of todays visit and agrees with plan of treatment. I have discussed any further diagnostic evaluation that may be needed or ordered today. We also reviewed her medications today. she has been encouraged to call the office with any questions or concerns that should arise related to todays visit.    No orders of the defined types were placed in this encounter.   Meds ordered this encounter  Medications   ALPRAZolam (XANAX) 0.5 MG tablet    Sig: Take 1 tablet (0.5 mg total) by mouth 2 (two) times daily as needed for anxiety.    Dispense:  60 tablet    Refill:  2   methocarbamol (ROBAXIN) 750 MG tablet    Sig: Take 1 tablet (750 mg total) by mouth 4 (four) times daily as needed for muscle spasms.    Dispense:  120 tablet    Refill:  2    Fill today thank you    Return in about 3 months (around 04/10/2023) for F/U, anxiety med refill, Nashley Cordoba PCP, eval new med.   Total time spent:30 Minutes Time spent includes review of chart, medications, test results, and follow up plan with the patient.   [place] Controlled Substance Database was reviewed by me.  This patient was seen by Jonetta Osgood, FNP-C in collaboration with Dr. Clayborn Bigness as a part of collaborative care agreement.   Bryndle Corredor R. Valetta Fuller, MSN, FNP-C Internal medicine

## 2023-01-20 ENCOUNTER — Encounter: Payer: Self-pay | Admitting: Nurse Practitioner

## 2023-01-29 ENCOUNTER — Ambulatory Visit (INDEPENDENT_AMBULATORY_CARE_PROVIDER_SITE_OTHER): Payer: Medicaid Other | Admitting: Nurse Practitioner

## 2023-01-29 ENCOUNTER — Encounter: Payer: Self-pay | Admitting: Nurse Practitioner

## 2023-01-29 VITALS — BP 125/67 | HR 59 | Temp 98.1°F | Resp 16 | Ht 65.0 in | Wt 163.0 lb

## 2023-01-29 DIAGNOSIS — E782 Mixed hyperlipidemia: Secondary | ICD-10-CM

## 2023-01-29 DIAGNOSIS — Z0001 Encounter for general adult medical examination with abnormal findings: Secondary | ICD-10-CM | POA: Diagnosis not present

## 2023-01-29 DIAGNOSIS — G500 Trigeminal neuralgia: Secondary | ICD-10-CM | POA: Diagnosis not present

## 2023-01-29 DIAGNOSIS — E538 Deficiency of other specified B group vitamins: Secondary | ICD-10-CM

## 2023-01-29 DIAGNOSIS — E559 Vitamin D deficiency, unspecified: Secondary | ICD-10-CM

## 2023-01-29 DIAGNOSIS — Z87891 Personal history of nicotine dependence: Secondary | ICD-10-CM

## 2023-01-29 DIAGNOSIS — Z1231 Encounter for screening mammogram for malignant neoplasm of breast: Secondary | ICD-10-CM

## 2023-01-29 NOTE — Progress Notes (Signed)
[facility] [place], [place] [address]  Internal MEDICINE  Office Visit Note  Patient Name: Nicole Rich  [id number]  [id number]  Date of Service: 01/29/2023  Chief Complaint  Patient presents with   Gastroesophageal Reflux   Annual Exam    HPI Ellarose presents for an annual well visit and physical exam.  Well-appearing 62 y.o. female with GERD, trigeminal neuralgia, psoriasis and anxiety.  Routine CRC screening: due next year Routine mammogram: due now DEXA scan: will do this in 2028 Pap smear: due in 2026 Labs: due for routine labs  New or worsening pain: no change Other concerns: none     01/29/2023    3:09 PM  Depression screen PHQ 2/9  Decreased Interest 0  Down, Depressed, Hopeless 0  PHQ - 2 Score 0    Functional Status Survey: Is the patient deaf or have difficulty hearing?: No Does the patient have difficulty seeing, even when wearing glasses/contacts?: Yes Does the patient have difficulty concentrating, remembering, or making decisions?: No Does the patient have difficulty walking or climbing stairs?: Yes Does the patient have difficulty dressing or bathing?: No Does the patient have difficulty doing errands alone such as visiting a doctor's office or shopping?: No      04/23/2022    3:59 PM 07/23/2022    4:01 PM 10/22/2022    3:53 PM 01/17/2023    4:00 PM 01/29/2023    3:09 PM  [place] in the past year? 1 0  1 1  Was there an injury with Fall? 0   0 0  Fall Risk Category Calculator '2   2 2  '$ Fall Risk Category (Retired) Moderate      (RETIRED) Patient Fall Risk Level Moderate fall risk  Moderate fall risk    Patient at Risk for Falls Due to   No Fall Risks    Fall risk Follow up Falls evaluation completed  Falls evaluation completed Falls evaluation completed Falls evaluation completed       Current Medication: Outpatient Encounter Medications as of 01/29/2023  Medication Sig   ALPRAZolam (XANAX) 0.5 MG tablet  Take 1 tablet (0.5 mg total) by mouth 2 (two) times daily as needed for anxiety.   carbamazepine (TEGRETOL) 200 MG tablet Take 1 tablet (200 mg total) by mouth 2 (two) times daily.   Cyanocobalamin (VITAMIN B 12 PO) Take 1 tablet by mouth daily.   methocarbamol (ROBAXIN) 750 MG tablet Take 1 tablet (750 mg total) by mouth 4 (four) times daily as needed for muscle spasms.   mupirocin ointment (BACTROBAN) 2 % Apply 1 application topically 2 (two) times daily.   OVER THE COUNTER MEDICATION Take 2 capsules by mouth 2 (two) times daily. Super digestive enzymes   pregabalin (LYRICA) 50 MG capsule Take 1 capsule (50 mg total) by mouth 3 (three) times daily.   No facility-administered encounter medications on file as of 01/29/2023.    Surgical History: Past Surgical History:  Procedure Laterality Date   BREAST BIOPSY Left 1996   NEG   CESAREAN SECTION     CYSTOSCOPY/URETEROSCOPY/HOLMIUM LASER/STENT PLACEMENT Right 08/13/2018   Procedure: CYSTOSCOPY/URETEROSCOPY/HOLMIUM LASER/STENT PLACEMENT;  Surgeon: Hollice Espy, MD;  Location: [facility] ORS;  Service: Urology;  Laterality: Right;   FOOT SURGERY Right    mortons neuroma   Lower Extremity Arterial and Venous Dopplers Bilateral 07/2022    LE A Dopplers 07/16/2022: Bilat EIA, CFA, SFA, Pop A, TAT, PTA normal - no stenosis; LE V Dopplers  08/07/2022: No evidence of DVT in both lower extremities.  No evidence of superficial venous thrombosis or superficial venous reflux seen bilaterally.   TRANSTHORACIC ECHOCARDIOGRAM  08/07/2022   EF 60 to 65%.  Normal LV function.  No RWMA.  Normal diastolic parameters.  Normal RV size and function.  Normal mitral and aortic valve.  Mildly elevated RAP-pretty normal study.   TUBAL LIGATION     UMBILICAL HERNIA REPAIR     Zio Patch Event Monitor (14-day)     Predominant underlying rhythm was Sinus Rhythm: HR range 41-117 bpm, AVG 66 bpm   4 atrial runs: Fastest 6 beats- HR range 156-193 bpm, Avg 173 bpm, 1.7 sec; Longest  9 beats HR Range 106-133 bpm / Avg 118 bpm, 5.15 sec.   Rare isolated PACs and PVCs (<1%).  Rare couplets.  No triplets.  Short episode of ventricular trigeminy noted.  No Sustained Arrhythmias: ATach, SVT, A-fib/A-flutter, V Tach    Medical History: Past Medical History:  Diagnosis Date   Anxiety    Arthritis    neck   Family history of adverse reaction to anesthesia    dad can only stay sedated for a certain amount of time per pt   Foot drop, right foot    WEARS BRACE   GERD (gastroesophageal reflux disease)    History of hiatal hernia    History of kidney stones    Peripheral neuropathy    TMJ (dislocation of temporomandibular joint)     Family History: Family History  Problem Relation Age of Onset   Cancer Mother    Diabetes Father    Hypertension Father    Congestive Heart Failure Sister    Cancer Brother    Breast cancer Neg Hx     Social History   Socioeconomic History   Marital status: Married    Spouse name: Not on file   Number of children: Not on file   Years of education: Not on file   Highest education level: Not on file  Occupational History   Not on file  Tobacco Use   Smoking status: Former    Packs/day: 0.50    Years: 40.00    Total pack years: 20.00    Types: Cigarettes    Quit date: 08/06/2016    Years since quitting: 6.4   Smokeless tobacco: Never  Vaping Use   Vaping Use: Some days  Substance and Sexual Activity   Alcohol use: No   Drug use: No   Sexual activity: Yes  Other Topics Concern   Not on file  Social History Narrative   Not on file   Social Determinants of Health   Financial Resource Strain: Not on file  Food Insecurity: Not on file  Transportation Needs: Not on file  Physical Activity: Sufficiently Active (07/29/2018)   Exercise Vital Sign    Days of Exercise per Week: 7 days    Minutes of Exercise per Session: 60 min  Stress: No Stress Concern Present (07/29/2018)   [place]    Feeling of Stress : Not at all  Social Connections: Somewhat Isolated (07/29/2018)   Social Connection and Isolation Panel [NHANES]    Frequency of Communication with Friends and Family: Three times a week    Frequency of Social Gatherings with Friends and Family: Three times a week    Attends Religious Services: Never    Active Member of Clubs or Organizations: No    Attends Club  or Organization Meetings: Never    Marital Status: Married  Human resources officer Violence: Not At Risk (07/29/2018)   Humiliation, Afraid, Rape, and Kick questionnaire    Fear of Current or Ex-Partner: No    Emotionally Abused: No    Physically Abused: No    Sexually Abused: No      Review of Systems  Constitutional:  Negative for activity change, appetite change, chills, fatigue, fever and unexpected weight change.  HENT: Negative.  Negative for congestion, ear pain, rhinorrhea, sore throat and trouble swallowing.   Eyes: Negative.   Respiratory: Negative.  Negative for cough, chest tightness, shortness of breath and wheezing.   Cardiovascular: Negative.  Negative for chest pain.  Gastrointestinal: Negative.  Negative for abdominal pain, blood in stool, constipation, diarrhea, nausea and vomiting.  Endocrine: Negative.   Genitourinary: Negative.  Negative for difficulty urinating, dysuria, frequency, hematuria and urgency.  Musculoskeletal: Negative.  Negative for arthralgias, back pain, joint swelling, myalgias and neck pain.  Skin: Negative.  Negative for rash and wound.  Allergic/Immunologic: Negative.  Negative for immunocompromised state.  Neurological: Negative.  Negative for dizziness, seizures, numbness and headaches.  Hematological: Negative.   Psychiatric/Behavioral: Negative.  Negative for behavioral problems, self-injury and suicidal ideas. The patient is not nervous/anxious.     Vital Signs: BP 125/67   Pulse (!) 59   Temp 98.1 F (36.7 C)   Resp 16    Ht '5\' 5"'$  (1.651 m)   Wt 163 lb (73.9 kg)   SpO2 97%   BMI 27.12 kg/m    Physical Exam Vitals reviewed.  Constitutional:      General: She is awake. She is not in acute distress.    Appearance: Normal appearance. She is well-developed, well-groomed and overweight. She is not ill-appearing or diaphoretic.  HENT:     Head: Normocephalic and atraumatic.     Right Ear: Tympanic membrane, ear canal and external ear normal.     Left Ear: Tympanic membrane, ear canal and external ear normal.     Nose: Nose normal. No congestion or rhinorrhea.     Mouth/Throat:     Lips: Pink.     Mouth: Mucous membranes are moist.     Pharynx: Oropharynx is clear. Uvula midline. No oropharyngeal exudate or posterior oropharyngeal erythema.  Eyes:     General: Lids are normal. Vision grossly intact. Gaze aligned appropriately. No scleral icterus.       Right eye: No discharge.        Left eye: No discharge.     Extraocular Movements: Extraocular movements intact.     Conjunctiva/sclera: Conjunctivae normal.     Pupils: Pupils are equal, round, and reactive to light.     Funduscopic exam:    Right eye: Red reflex present.        Left eye: Red reflex present. Neck:     Thyroid: No thyromegaly.     Vascular: No carotid bruit or JVD.     Trachea: Trachea and phonation normal. No tracheal deviation.  Cardiovascular:     Rate and Rhythm: Normal rate and regular rhythm.     Pulses: Normal pulses.     Heart sounds: Normal heart sounds, S1 normal and S2 normal. No murmur heard.    No friction rub. No gallop.  Pulmonary:     Effort: Pulmonary effort is normal. No accessory muscle usage or respiratory distress.     Breath sounds: Normal breath sounds and air entry. No stridor. No wheezing or rales.  Chest:     Chest wall: No tenderness.  Breasts:    Right: Normal. No swelling, bleeding, inverted nipple, mass, nipple discharge, skin change or tenderness.     Left: Normal. No swelling, bleeding, inverted  nipple, mass, nipple discharge, skin change or tenderness.  Abdominal:     General: Bowel sounds are normal. There is no distension.     Palpations: Abdomen is soft. There is no shifting dullness, fluid wave, mass or pulsatile mass.     Tenderness: There is no abdominal tenderness. There is no guarding or rebound.  Musculoskeletal:        General: No tenderness or deformity. Normal range of motion.     Cervical back: Normal range of motion and neck supple.     Right lower leg: No edema.     Left lower leg: No edema.  Lymphadenopathy:     Cervical: No cervical adenopathy.     Upper Body:     Right upper body: No supraclavicular, axillary or pectoral adenopathy.     Left upper body: No supraclavicular, axillary or pectoral adenopathy.  Skin:    General: Skin is warm and dry.     Capillary Refill: Capillary refill takes less than 2 seconds.     Coloration: Skin is not pale.     Findings: No erythema or rash.  Neurological:     Mental Status: She is alert and oriented to person, place, and time.     Cranial Nerves: No cranial nerve deficit.     Motor: No abnormal muscle tone.     Coordination: Coordination normal.     Deep Tendon Reflexes: Reflexes are normal and symmetric.  Psychiatric:        Mood and Affect: Mood normal.        Behavior: Behavior normal. Behavior is cooperative.        Thought Content: Thought content normal.        Judgment: Judgment normal.        Assessment/Plan: 1. Encounter for routine adult health examination with abnormal findings Age-appropriate preventive screenings and vaccinations discussed, annual physical exam completed. Routine labs for health maintenance ordered, see below. PHM updated.  - CBC with Differential/Platelet - CMP14+EGFR - Lipid Profile - B12 and Folate Panel - TSH + free T4 - Vitamin D (25 hydroxy)  2. Trigeminal neuralgia of left side of face Takes tegretol to control nerve pain  3. Vitamin D deficiency Routine labs  ordered - CBC with Differential/Platelet - CMP14+EGFR - Lipid Profile - B12 and Folate Panel - TSH + free T4 - Vitamin D (25 hydroxy)  4. Mixed hyperlipidemia Routine labs ordered - CBC with Differential/Platelet - CMP14+EGFR - Lipid Profile - B12 and Folate Panel - TSH + free T4 - Vitamin D (25 hydroxy)  5. B12 deficiency Routine labs ordered - CBC with Differential/Platelet - CMP14+EGFR - Lipid Profile - B12 and Folate Panel - TSH + free T4 - Vitamin D (25 hydroxy)  6. Encounter for screening mammogram for malignant neoplasm of breast Routine mammogram ordered - MM 3D SCREEN BREAST BILATERAL; Future  7. Stopped smoking with greater than 40 pack year history Routine lung cancer screening ordered.  - CT CHEST LUNG CANCER SCREENING LOW DOSE WO CONTRAST; Future     General Counseling: lee-anne flicker understanding of the findings of todays visit and agrees with plan of treatment. I have discussed any further diagnostic evaluation that may be needed or ordered today. We also reviewed her medications today. she has  been encouraged to call the office with any questions or concerns that should arise related to todays visit.    Orders Placed This Encounter  Procedures   CT CHEST LUNG CANCER SCREENING LOW DOSE WO CONTRAST   MM 3D SCREEN BREAST BILATERAL   CBC with Differential/Platelet   CMP14+EGFR   Lipid Profile   B12 and Folate Panel   TSH + free T4   Vitamin D (25 hydroxy)    No orders of the defined types were placed in this encounter.   Return in about 3 months (around 04/30/2023) for F/U, anxiety med refill, Marsena Taff PCP.   Total time spent:30 Minutes Time spent includes review of chart, medications, test results, and follow up plan with the patient.   [place] Controlled Substance Database was reviewed by me.  This patient was seen by Jonetta Osgood, FNP-C in collaboration with Dr. Clayborn Bigness as a part of collaborative care agreement.  Kaycen Whitworth R. Valetta Fuller,  MSN, FNP-C Internal medicine

## 2023-02-01 LAB — B12 AND FOLATE PANEL
Folate: 9.3 ng/mL (ref >3.0)
Vitamin B-12: 461 pg/mL (ref 232–1245)

## 2023-02-01 LAB — CBC WITH DIFFERENTIAL/PLATELET
Basophils Absolute: 0 10*3/uL (ref 0.0–0.2)
Basos: 0 %
EOS (ABSOLUTE): 0.1 10*3/uL (ref 0.0–0.4)
Eos: 2 %
Hematocrit: 43.5 % (ref 34.0–46.6)
Hemoglobin: 14.2 g/dL (ref 11.1–15.9)
Immature Grans (Abs): 0.1 10*3/uL (ref 0.0–0.1)
Immature Granulocytes: 1 %
Lymphocytes Absolute: 2.6 10*3/uL (ref 0.7–3.1)
Lymphs: 36 %
MCH: 29.2 pg (ref 26.6–33.0)
MCHC: 32.6 g/dL (ref 31.5–35.7)
MCV: 90 fL (ref 79–97)
Monocytes Absolute: 0.6 10*3/uL (ref 0.1–0.9)
Monocytes: 8 %
Neutrophils Absolute: 3.9 10*3/uL (ref 1.4–7.0)
Neutrophils: 53 %
Platelets: 284 10*3/uL (ref 150–450)
RBC: 4.86 x10E6/uL (ref 3.77–5.28)
RDW: 12.7 % (ref 11.7–15.4)
WBC: 7.4 10*3/uL (ref 3.4–10.8)

## 2023-02-01 LAB — CMP14+EGFR
ALT: 9 IU/L (ref 0–32)
AST: 16 IU/L (ref 0–40)
Albumin/Globulin Ratio: 1.9 (ref 1.2–2.2)
Albumin: 4.1 g/dL (ref 3.8–4.9)
Alkaline Phosphatase: 94 IU/L (ref 44–121)
BUN/Creatinine Ratio: 18 (ref 12–28)
BUN: 14 mg/dL (ref 8–27)
Bilirubin Total: 0.5 mg/dL (ref 0.0–1.2)
CO2: 26 mmol/L (ref 20–29)
Calcium: 9.7 mg/dL (ref 8.7–10.3)
Chloride: 105 mmol/L (ref 96–106)
Creatinine, Ser: 0.77 mg/dL (ref 0.57–1.00)
Globulin, Total: 2.2 g/dL (ref 1.5–4.5)
Glucose: 84 mg/dL (ref 70–99)
Potassium: 4.7 mmol/L (ref 3.5–5.2)
Sodium: 144 mmol/L (ref 134–144)
Total Protein: 6.3 g/dL (ref 6.0–8.5)
eGFR: 88 mL/min/{1.73_m2} (ref >59)

## 2023-02-01 LAB — LIPID PANEL
Chol/HDL Ratio: 3.8 ratio (ref 0.0–4.4)
Cholesterol, Total: 216 mg/dL — ABNORMAL HIGH (ref 100–199)
HDL: 57 mg/dL (ref >39)
LDL Chol Calc (NIH): 137 mg/dL — ABNORMAL HIGH (ref 0–99)
Triglycerides: 122 mg/dL (ref 0–149)
VLDL Cholesterol Cal: 22 mg/dL (ref 5–40)

## 2023-02-01 LAB — VITAMIN D 25 HYDROXY (VIT D DEFICIENCY, FRACTURES): Vit D, 25-Hydroxy: 23.1 ng/mL — ABNORMAL LOW (ref 30.0–100.0)

## 2023-02-01 LAB — TSH+FREE T4
Free T4: 1.09 ng/dL (ref 0.82–1.77)
TSH: 3.82 u[IU]/mL (ref 0.450–4.500)

## 2023-02-26 ENCOUNTER — Telehealth: Payer: Self-pay | Admitting: Nurse Practitioner

## 2023-02-26 NOTE — Telephone Encounter (Signed)
Lvm and sent message to patient regarding CT-Nicole Rich

## 2023-03-05 ENCOUNTER — Ambulatory Visit
Admission: RE | Admit: 2023-03-05 | Discharge: 2023-03-05 | Disposition: A | Discharge status: Home / Self Care | Payer: Medicaid Other | Source: Ambulatory Visit | Attending: Nurse Practitioner | Admitting: Nurse Practitioner

## 2023-03-05 DIAGNOSIS — Z87891 Personal history of nicotine dependence: Secondary | ICD-10-CM

## 2023-03-12 ENCOUNTER — Telehealth: Payer: Self-pay | Admitting: Nurse Practitioner

## 2023-03-12 NOTE — Telephone Encounter (Signed)
Lvm to schedule f/u to review CT results-Toni

## 2023-03-18 ENCOUNTER — Other Ambulatory Visit: Payer: Self-pay | Admitting: Nurse Practitioner

## 2023-03-18 DIAGNOSIS — G500 Trigeminal neuralgia: Secondary | ICD-10-CM

## 2023-03-18 NOTE — Telephone Encounter (Signed)
Next 04/15/22

## 2023-03-21 ENCOUNTER — Other Ambulatory Visit: Payer: Self-pay | Admitting: Nurse Practitioner

## 2023-03-21 DIAGNOSIS — F419 Anxiety disorder, unspecified: Secondary | ICD-10-CM

## 2023-03-25 NOTE — Progress Notes (Signed)
Will discuss results at next upcoming office visit

## 2023-04-16 ENCOUNTER — Ambulatory Visit (INDEPENDENT_AMBULATORY_CARE_PROVIDER_SITE_OTHER): Payer: Medicaid Other | Admitting: Nurse Practitioner

## 2023-04-16 VITALS — BP 126/65 | HR 62 | Temp 96.9°F | Resp 16 | Ht 65.0 in | Wt 161.2 lb

## 2023-04-16 DIAGNOSIS — G629 Polyneuropathy, unspecified: Secondary | ICD-10-CM

## 2023-04-16 DIAGNOSIS — R21 Rash and other nonspecific skin eruption: Secondary | ICD-10-CM | POA: Diagnosis not present

## 2023-04-16 DIAGNOSIS — G500 Trigeminal neuralgia: Secondary | ICD-10-CM | POA: Diagnosis not present

## 2023-04-16 MED ORDER — METHOCARBAMOL 750 MG PO TABS: 750.0000 mg | ORAL_TABLET | Freq: Four times a day (QID) | ORAL | 2 refills | Status: DC | PRN

## 2023-04-16 MED ORDER — MUPIROCIN 2 % EX OINT: 1.0000 | TOPICAL_OINTMENT | Freq: Two times a day (BID) | CUTANEOUS | 2 refills | Status: AC

## 2023-04-16 MED ORDER — CARBAMAZEPINE 200 MG PO TABS: 200.0000 mg | ORAL_TABLET | Freq: Two times a day (BID) | ORAL | 3 refills | Status: DC

## 2023-04-16 NOTE — Progress Notes (Signed)
[facility] [address] [place], [place] [address]  Internal MEDICINE  Office Visit Note  Patient Name: Nicole Rich  [id number]  [id number]  Date of Service: 04/16/2023  Chief Complaint  Patient presents with   Trigeminal Neuralgia    HPI Nicole Rich presents for a follow-up visit for trigeminal neuralgia and medication refills Muscle relaxant is working, need refills.  CT chest showed Lung RADS 1 negative as well as aortic atherosclerosis and mild emphysema. None of the results were new information but there is no significantly concerning findings, annual lung cancer screenings are still recommended   Current Medication: Outpatient Encounter Medications as of 04/16/2023  Medication Sig   ALPRAZolam (XANAX) 0.5 MG tablet TAKE ONE TABLET BY MOUTH TWICE (2) DAILY AS NEEDED FOR ANXIETY   carbamazepine (TEGRETOL) 200 MG tablet Take 1 tablet (200 mg total) by mouth 2 (two) times daily.   Cyanocobalamin (VITAMIN B 12 PO) Take 1 tablet by mouth daily.   methocarbamol (ROBAXIN) 750 MG tablet Take 1 tablet (750 mg total) by mouth 4 (four) times daily as needed for muscle spasms.   mupirocin ointment (BACTROBAN) 2 % Apply 1 Application topically 2 (two) times daily.   OVER THE COUNTER MEDICATION Take 2 capsules by mouth 2 (two) times daily. Super digestive enzymes   pregabalin (LYRICA) 50 MG capsule TAKE ONE CAPSULE BY MOUTH THREE TIMES DAILY   [DISCONTINUED] carbamazepine (TEGRETOL) 200 MG tablet Take 1 tablet (200 mg total) by mouth 2 (two) times daily.   [DISCONTINUED] methocarbamol (ROBAXIN) 750 MG tablet Take 1 tablet (750 mg total) by mouth 4 (four) times daily as needed for muscle spasms.   [DISCONTINUED] mupirocin ointment (BACTROBAN) 2 % Apply 1 application topically 2 (two) times daily.   No facility-administered encounter medications on file as of 04/16/2023.    Surgical History: Past Surgical History:  Procedure Laterality Date   BREAST BIOPSY Left 1996   NEG    CESAREAN SECTION     CYSTOSCOPY/URETEROSCOPY/HOLMIUM LASER/STENT PLACEMENT Right 08/13/2018   Procedure: CYSTOSCOPY/URETEROSCOPY/HOLMIUM LASER/STENT PLACEMENT;  Surgeon: Vanna Scotland, MD;  Location: [facility] ORS;  Service: Urology;  Laterality: Right;   FOOT SURGERY Right    mortons neuroma   Lower Extremity Arterial and Venous Dopplers Bilateral 07/2022    LE A Dopplers 07/16/2022: Bilat EIA, CFA, SFA, Pop A, TAT, PTA normal - no stenosis; LE V Dopplers 08/07/2022: No evidence of DVT in both lower extremities.  No evidence of superficial venous thrombosis or superficial venous reflux seen bilaterally.   TRANSTHORACIC ECHOCARDIOGRAM  08/07/2022   EF 60 to 65%.  Normal LV function.  No RWMA.  Normal diastolic parameters.  Normal RV size and function.  Normal mitral and aortic valve.  Mildly elevated RAP-pretty normal study.   TUBAL LIGATION     UMBILICAL HERNIA REPAIR     Zio Patch Event Monitor (14-day)     Predominant underlying rhythm was Sinus Rhythm: HR range 41-117 bpm, AVG 66 bpm   4 atrial runs: Fastest 6 beats- HR range 156-193 bpm, Avg 173 bpm, 1.7 sec; Longest 9 beats HR Range 106-133 bpm / Avg 118 bpm, 5.15 sec.   Rare isolated PACs and PVCs (<1%).  Rare couplets.  No triplets.  Short episode of ventricular trigeminy noted.  No Sustained Arrhythmias: ATach, SVT, A-fib/A-flutter, V Tach    Medical History: Past Medical History:  Diagnosis Date   Anxiety    Arthritis    neck   Family history of adverse reaction to anesthesia  dad can only stay sedated for a certain amount of time per pt   Foot drop, right foot    WEARS BRACE   GERD (gastroesophageal reflux disease)    History of hiatal hernia    History of kidney stones    Peripheral neuropathy    TMJ (dislocation of temporomandibular joint)     Family History: Family History  Problem Relation Age of Onset   Cancer Mother    Diabetes Father    Hypertension Father    Congestive Heart Failure Sister    Cancer Brother     Breast cancer Neg Hx     Social History   Socioeconomic History   Marital status: Married    Spouse name: Not on file   Number of children: Not on file   Years of education: Not on file   Highest education level: Not on file  Occupational History   Not on file  Tobacco Use   Smoking status: Former    Packs/day: 0.50    Years: 40.00    Additional pack years: 0.00    Total pack years: 20.00    Types: Cigarettes    Quit date: 08/06/2016    Years since quitting: 6.7   Smokeless tobacco: Never  Vaping Use   Vaping Use: Some days  Substance and Sexual Activity   Alcohol use: No   Drug use: No   Sexual activity: Yes  Other Topics Concern   Not on file  Social History Narrative   Not on file   Social Determinants of Health   Financial Resource Strain: Not on file  Food Insecurity: Not on file  Transportation Needs: Not on file  Physical Activity: Sufficiently Active (07/29/2018)   Exercise Vital Sign    Days of Exercise per Week: 7 days    Minutes of Exercise per Session: 60 min  Stress: No Stress Concern Present (07/29/2018)   [employer] of Occupational Health - Occupational Stress Questionnaire    Feeling of Stress : Not at all  Social Connections: Somewhat Isolated (07/29/2018)   Social Connection and Isolation Panel [NHANES]    Frequency of Communication with Friends and Family: Three times a week    Frequency of Social Gatherings with Friends and Family: Three times a week    Attends Religious Services: Never    Active Member of Clubs or Organizations: No    Attends Banker Meetings: Never    Marital Status: Married  Catering manager Violence: Not At Risk (07/29/2018)   Humiliation, Afraid, Rape, and Kick questionnaire    Fear of Current or Ex-Partner: No    Emotionally Abused: No    Physically Abused: No    Sexually Abused: No      Review of Systems  Constitutional:  Negative for chills, fatigue and unexpected weight change.  HENT:   Negative for congestion, rhinorrhea, sneezing and sore throat.   Eyes:  Negative for redness.  Respiratory:  Negative for cough, chest tightness, shortness of breath and wheezing.   Cardiovascular: Negative.  Negative for chest pain and palpitations.  Gastrointestinal: Negative.  Negative for abdominal pain, constipation, diarrhea, nausea and vomiting.  Genitourinary:  Negative for dysuria and frequency.  Musculoskeletal:  Positive for arthralgias. Negative for back pain, joint swelling and neck pain.       Facial pain from trigeminal neuralgia, treated with carbamazapine  Skin:  Negative for rash.  Neurological: Negative.  Negative for tremors and numbness.  Hematological:  Negative for adenopathy. Does not  bruise/bleed easily.  Psychiatric/Behavioral:  Negative for behavioral problems (Depression), sleep disturbance and suicidal ideas. The patient is nervous/anxious.     Vital Signs: BP 126/65   Pulse 62   Temp (!) 96.9 F (36.1 C)   Resp 16   Ht 5\' 5"  (1.651 m)   Wt 161 lb 3.2 oz (73.1 kg)   SpO2 95%   BMI 26.83 kg/m    Physical Exam Vitals reviewed.  Constitutional:      General: She is not in acute distress.    Appearance: Normal appearance. She is not ill-appearing.  HENT:     Head: Normocephalic and atraumatic.  Eyes:     Pupils: Pupils are equal, round, and reactive to light.  Cardiovascular:     Rate and Rhythm: Normal rate and regular rhythm.  Pulmonary:     Effort: Pulmonary effort is normal. No respiratory distress.  Neurological:     Mental Status: She is alert and oriented to person, place, and time.  Psychiatric:        Mood and Affect: Mood normal.        Behavior: Behavior normal.        Assessment/Plan: 1. Trigeminal neuralgia of left side of face Continue tegretol as prescribed. - carbamazepine (TEGRETOL) 200 MG tablet; Take 1 tablet (200 mg total) by mouth 2 (two) times daily.  Dispense: 180 tablet; Refill: 3  2. Neuropathy Continue prn  methocarbamol as prescribed.  - methocarbamol (ROBAXIN) 750 MG tablet; Take 1 tablet (750 mg total) by mouth 4 (four) times daily as needed for muscle spasms.  Dispense: 120 tablet; Refill: 2  3. Rash Use mupirocin if needed - mupirocin ointment (BACTROBAN) 2 %; Apply 1 Application topically 2 (two) times daily.  Dispense: 22 g; Refill: 2   General Counseling: Nicole Rich verbalizes understanding of the findings of todays visit and agrees with plan of treatment. I have discussed any further diagnostic evaluation that may be needed or ordered today. We also reviewed her medications today. she has been encouraged to call the office with any questions or concerns that should arise related to todays visit.    No orders of the defined types were placed in this encounter.   Meds ordered this encounter  Medications   mupirocin ointment (BACTROBAN) 2 %    Sig: Apply 1 Application topically 2 (two) times daily.    Dispense:  22 g    Refill:  2   methocarbamol (ROBAXIN) 750 MG tablet    Sig: Take 1 tablet (750 mg total) by mouth 4 (four) times daily as needed for muscle spasms.    Dispense:  120 tablet    Refill:  2    Fill today thank you   carbamazepine (TEGRETOL) 200 MG tablet    Sig: Take 1 tablet (200 mg total) by mouth 2 (two) times daily.    Dispense:  180 tablet    Refill:  3    Risks and side effects discussed with patient, she has been on this medication before, please discontinue nortriptyline.    Return for F/U, med refill, Nevin Kozuch PCP in mid june, please schedule.   Total time spent:30 Minutes Time spent includes review of chart, medications, test results, and follow up plan with the patient.    Controlled Substance Database was reviewed by me.  This patient was seen by Sallyanne Kuster, FNP-C in collaboration with Dr. Beverely Risen as a part of collaborative care agreement.   Vladislav Axelson R. Tedd Sias, MSN, FNP-C Internal medicine

## 2023-04-27 ENCOUNTER — Encounter: Payer: Self-pay | Admitting: Nurse Practitioner

## 2023-05-21 ENCOUNTER — Ambulatory Visit
Admission: RE | Admit: 2023-05-21 | Discharge: 2023-05-21 | Disposition: A | Discharge status: Home / Self Care | Payer: Medicaid Other | Source: Ambulatory Visit | Attending: Nurse Practitioner | Admitting: Nurse Practitioner

## 2023-05-21 DIAGNOSIS — Z1231 Encounter for screening mammogram for malignant neoplasm of breast: Secondary | ICD-10-CM

## 2023-05-24 ENCOUNTER — Other Ambulatory Visit: Payer: Self-pay | Admitting: Nurse Practitioner

## 2023-05-24 DIAGNOSIS — N63 Unspecified lump in unspecified breast: Secondary | ICD-10-CM

## 2023-05-24 DIAGNOSIS — R928 Other abnormal and inconclusive findings on diagnostic imaging of breast: Secondary | ICD-10-CM

## 2023-05-29 ENCOUNTER — Ambulatory Visit
Admission: RE | Admit: 2023-05-29 | Discharge: 2023-05-29 | Disposition: A | Discharge status: Home / Self Care | Payer: Medicaid Other | Source: Ambulatory Visit | Attending: Nurse Practitioner | Admitting: Nurse Practitioner

## 2023-05-29 DIAGNOSIS — N63 Unspecified lump in unspecified breast: Secondary | ICD-10-CM

## 2023-05-29 DIAGNOSIS — R928 Other abnormal and inconclusive findings on diagnostic imaging of breast: Secondary | ICD-10-CM

## 2023-06-11 ENCOUNTER — Encounter: Payer: Self-pay | Admitting: Nurse Practitioner

## 2023-06-11 ENCOUNTER — Ambulatory Visit (INDEPENDENT_AMBULATORY_CARE_PROVIDER_SITE_OTHER): Payer: Medicaid Other | Admitting: Nurse Practitioner

## 2023-06-11 VITALS — BP 128/88 | HR 60 | Temp 98.0°F | Resp 16 | Ht 65.0 in | Wt 159.0 lb

## 2023-06-11 DIAGNOSIS — F419 Anxiety disorder, unspecified: Secondary | ICD-10-CM

## 2023-06-11 DIAGNOSIS — I700 Atherosclerosis of aorta: Secondary | ICD-10-CM | POA: Diagnosis not present

## 2023-06-11 DIAGNOSIS — G500 Trigeminal neuralgia: Secondary | ICD-10-CM

## 2023-06-11 MED ORDER — PREGABALIN 50 MG PO CAPS: 50.0000 mg | ORAL_CAPSULE | Freq: Three times a day (TID) | ORAL | 2 refills | Status: DC

## 2023-06-11 MED ORDER — ALPRAZOLAM 0.5 MG PO TABS: ORAL_TABLET | ORAL | 2 refills | Status: DC

## 2023-06-11 NOTE — Progress Notes (Signed)
[facility] [address] [place], [place] [address]  Internal MEDICINE  Office Visit Note  Patient Name: Nicole Rich  [id number]  [id number]  Date of Service: 06/11/2023  Chief Complaint  Patient presents with   Follow-up   Gastroesophageal Reflux    HPI Nicole Rich presents for a follow-up visit for trigeminal neuralgia, anxiety and aortic atherosclerosis.  Trigeminal neuralgia -- carbamazapine and lyrica remain effective in controlling her flare ups and episodes most of the time.  Anxiety -- controlled, takes alprazolam prn as prescribed.  High cholesterol -- most recent imaging showed aortic atherosclerosis. Not currently on a statin. But her numbers have been improving with diet.     Current Medication: Outpatient Encounter Medications as of 06/11/2023  Medication Sig   carbamazepine (TEGRETOL) 200 MG tablet Take 1 tablet (200 mg total) by mouth 2 (two) times daily.   Cyanocobalamin (VITAMIN B 12 PO) Take 1 tablet by mouth daily.   methocarbamol (ROBAXIN) 750 MG tablet Take 1 tablet (750 mg total) by mouth 4 (four) times daily as needed for muscle spasms.   mupirocin ointment (BACTROBAN) 2 % Apply 1 Application topically 2 (two) times daily.   OVER THE COUNTER MEDICATION Take 2 capsules by mouth 2 (two) times daily. Super digestive enzymes   [DISCONTINUED] ALPRAZolam (XANAX) 0.5 MG tablet TAKE ONE TABLET BY MOUTH TWICE (2) DAILY AS NEEDED FOR ANXIETY   [DISCONTINUED] pregabalin (LYRICA) 50 MG capsule TAKE ONE CAPSULE BY MOUTH THREE TIMES DAILY   ALPRAZolam (XANAX) 0.5 MG tablet TAKE ONE TABLET BY MOUTH TWICE (2) DAILY AS NEEDED FOR ANXIETY   pregabalin (LYRICA) 50 MG capsule Take 1 capsule (50 mg total) by mouth 3 (three) times daily.   No facility-administered encounter medications on file as of 06/11/2023.    Surgical History: Past Surgical History:  Procedure Laterality Date   BREAST BIOPSY Left 1996   NEG   CESAREAN SECTION      CYSTOSCOPY/URETEROSCOPY/HOLMIUM LASER/STENT PLACEMENT Right 08/13/2018   Procedure: CYSTOSCOPY/URETEROSCOPY/HOLMIUM LASER/STENT PLACEMENT;  Surgeon: Vanna Scotland, MD;  Location: [facility] ORS;  Service: Urology;  Laterality: Right;   FOOT SURGERY Right    mortons neuroma   Lower Extremity Arterial and Venous Dopplers Bilateral 07/2022    LE A Dopplers 07/16/2022: Bilat EIA, CFA, SFA, Pop A, TAT, PTA normal - no stenosis; LE V Dopplers 08/07/2022: No evidence of DVT in both lower extremities.  No evidence of superficial venous thrombosis or superficial venous reflux seen bilaterally.   TRANSTHORACIC ECHOCARDIOGRAM  08/07/2022   EF 60 to 65%.  Normal LV function.  No RWMA.  Normal diastolic parameters.  Normal RV size and function.  Normal mitral and aortic valve.  Mildly elevated RAP-pretty normal study.   TUBAL LIGATION     UMBILICAL HERNIA REPAIR     Zio Patch Event Monitor (14-day)     Predominant underlying rhythm was Sinus Rhythm: HR range 41-117 bpm, AVG 66 bpm   4 atrial runs: Fastest 6 beats- HR range 156-193 bpm, Avg 173 bpm, 1.7 sec; Longest 9 beats HR Range 106-133 bpm / Avg 118 bpm, 5.15 sec.   Rare isolated PACs and PVCs (<1%).  Rare couplets.  No triplets.  Short episode of ventricular trigeminy noted.  No Sustained Arrhythmias: ATach, SVT, A-fib/A-flutter, V Tach    Medical History: Past Medical History:  Diagnosis Date   Anxiety    Arthritis    neck   Family history of adverse reaction to anesthesia    dad can only stay  sedated for a certain amount of time per pt   Foot drop, right foot    WEARS BRACE   GERD (gastroesophageal reflux disease)    History of hiatal hernia    History of kidney stones    Peripheral neuropathy    TMJ (dislocation of temporomandibular joint)     Family History: Family History  Problem Relation Age of Onset   Cancer Mother    Diabetes Father    Hypertension Father    Congestive Heart Failure Sister    Cancer Brother    Breast cancer Neg Hx      Social History   Socioeconomic History   Marital status: Married    Spouse name: Not on file   Number of children: Not on file   Years of education: Not on file   Highest education level: Not on file  Occupational History   Not on file  Tobacco Use   Smoking status: Former    Packs/day: 0.50    Years: 40.00    Additional pack years: 0.00    Total pack years: 20.00    Types: Cigarettes    Quit date: 08/06/2016    Years since quitting: 6.8   Smokeless tobacco: Never  Vaping Use   Vaping Use: Some days  Substance and Sexual Activity   Alcohol use: No   Drug use: No   Sexual activity: Yes  Other Topics Concern   Not on file  Social History Narrative   Not on file   Social Determinants of Health   Financial Resource Strain: Not on file  Food Insecurity: Not on file  Transportation Needs: Not on file  Physical Activity: Sufficiently Active (07/29/2018)   Exercise Vital Sign    Days of Exercise per Week: 7 days    Minutes of Exercise per Session: 60 min  Stress: No Stress Concern Present (07/29/2018)   [employer] of Occupational Health - Occupational Stress Questionnaire    Feeling of Stress : Not at all  Social Connections: Somewhat Isolated (07/29/2018)   Social Connection and Isolation Panel [NHANES]    Frequency of Communication with Friends and Family: Three times a week    Frequency of Social Gatherings with Friends and Family: Three times a week    Attends Religious Services: Never    Active Member of Clubs or Organizations: No    Attends Banker Meetings: Never    Marital Status: Married  Catering manager Violence: Not At Risk (07/29/2018)   Humiliation, Afraid, Rape, and Kick questionnaire    Fear of Current or Ex-Partner: No    Emotionally Abused: No    Physically Abused: No    Sexually Abused: No      Review of Systems  Constitutional:  Negative for chills, fatigue and unexpected weight change.  HENT:  Negative for congestion,  rhinorrhea, sneezing and sore throat.   Eyes:  Negative for redness.  Respiratory:  Negative for cough, chest tightness, shortness of breath and wheezing.   Cardiovascular: Negative.  Negative for chest pain and palpitations.  Gastrointestinal: Negative.  Negative for abdominal pain, constipation, diarrhea, nausea and vomiting.  Genitourinary:  Negative for dysuria and frequency.  Musculoskeletal:  Positive for arthralgias. Negative for back pain, joint swelling and neck pain.       Facial pain from trigeminal neuralgia, treated with carbamazapine  Skin:  Negative for rash.  Neurological: Negative.  Negative for tremors and numbness.  Hematological:  Negative for adenopathy. Does not bruise/bleed easily.  Psychiatric/Behavioral:  Negative for behavioral problems (Depression), sleep disturbance and suicidal ideas. The patient is nervous/anxious.     Vital Signs: BP 128/88 Comment: 130/90  Pulse 60 Comment: 55  Temp 98 F (36.7 C)   Resp 16   Ht 5\' 5"  (1.651 m)   Wt 159 lb (72.1 kg)   SpO2 95%   BMI 26.46 kg/m    Physical Exam Vitals reviewed.  Constitutional:      General: She is not in acute distress.    Appearance: Normal appearance. She is not ill-appearing.  HENT:     Head: Normocephalic and atraumatic.  Eyes:     Pupils: Pupils are equal, round, and reactive to light.  Cardiovascular:     Rate and Rhythm: Normal rate and regular rhythm.  Pulmonary:     Effort: Pulmonary effort is normal. No respiratory distress.  Neurological:     Mental Status: She is alert and oriented to person, place, and time.  Psychiatric:        Mood and Affect: Mood normal.        Behavior: Behavior normal.        Assessment/Plan: 1. Trigeminal neuralgia of left side of face Continue carbamazapine and lyrica as prescribed.  - pregabalin (LYRICA) 50 MG capsule; Take 1 capsule (50 mg total) by mouth 3 (three) times daily.  Dispense: 90 capsule; Refill: 2  2. Aortic atherosclerosis  (HCC) Continue to monitor, if lipid panel still elevated with next labs, will consider adding a statin  3. Anxiety disorder, unspecified type Continue prn alprazolam as prescribed, follow up in 3 months for additional refills - ALPRAZolam (XANAX) 0.5 MG tablet; TAKE ONE TABLET BY MOUTH TWICE (2) DAILY AS NEEDED FOR ANXIETY  Dispense: 60 tablet; Refill: 2   General Counseling: suvi husman understanding of the findings of todays visit and agrees with plan of treatment. I have discussed any further diagnostic evaluation that may be needed or ordered today. We also reviewed her medications today. she has been encouraged to call the office with any questions or concerns that should arise related to todays visit.    No orders of the defined types were placed in this encounter.   Meds ordered this encounter  Medications   ALPRAZolam (XANAX) 0.5 MG tablet    Sig: TAKE ONE TABLET BY MOUTH TWICE (2) DAILY AS NEEDED FOR ANXIETY    Dispense:  60 tablet    Refill:  2   pregabalin (LYRICA) 50 MG capsule    Sig: Take 1 capsule (50 mg total) by mouth 3 (three) times daily.    Dispense:  90 capsule    Refill:  2    Return in about 3 months (around 09/04/2023) for F/U, anxiety med refill, pain med refill, General Wearing PCP, UDS at next visit. .   Total time spent:30 Minutes Time spent includes review of chart, medications, test results, and follow up plan with the patient.   [place] Controlled Substance Database was reviewed by me.  This patient was seen by Sallyanne Kuster, FNP-C in collaboration with Dr. Beverely Risen as a part of collaborative care agreement.   Atilla Zollner R. Tedd Sias, MSN, FNP-C Internal medicine

## 2023-06-17 ENCOUNTER — Telehealth: Payer: Self-pay | Admitting: Nurse Practitioner

## 2023-06-17 NOTE — Telephone Encounter (Signed)
78 pages of MR faxed to [facility]; [phone]

## 2023-09-11 ENCOUNTER — Encounter: Payer: Self-pay | Admitting: Nurse Practitioner

## 2023-09-11 ENCOUNTER — Ambulatory Visit (INDEPENDENT_AMBULATORY_CARE_PROVIDER_SITE_OTHER): Payer: Medicaid Other | Admitting: Nurse Practitioner

## 2023-09-11 VITALS — BP 128/72 | HR 63 | Temp 98.1°F | Resp 16 | Ht 65.0 in | Wt 157.0 lb

## 2023-09-11 DIAGNOSIS — G500 Trigeminal neuralgia: Secondary | ICD-10-CM

## 2023-09-11 DIAGNOSIS — F419 Anxiety disorder, unspecified: Secondary | ICD-10-CM

## 2023-09-11 DIAGNOSIS — I700 Atherosclerosis of aorta: Secondary | ICD-10-CM | POA: Diagnosis not present

## 2023-09-11 MED ORDER — ALPRAZOLAM 0.5 MG PO TABS: ORAL_TABLET | ORAL | 2 refills | Status: DC

## 2023-09-11 MED ORDER — PREGABALIN 50 MG PO CAPS: 50.0000 mg | ORAL_CAPSULE | Freq: Three times a day (TID) | ORAL | 2 refills | Status: DC

## 2023-09-11 NOTE — Progress Notes (Signed)
[facility] [address] [place], [place] [address]  Internal MEDICINE  Office Visit Note  Patient Name: Nicole Rich  [id number]  [id number]  Date of Service: 09/11/2023  Chief Complaint  Patient presents with   Gastroesophageal Reflux   Follow-up    HPI Nicole Rich presents for a follow-up visit for medication refills, anxiety, trigeminal neuralgia, and high cholesterol Trigeminal neuralgia -- carbamazapine and lyrica remain effective in controlling Nicole Rich flare ups and episodes most of the time. Having a blurry day with Nicole Rich vision.  Anxiety -- controlled, takes alprazolam prn as prescribed.  High cholesterol -- still improving cholesterol with diet.     Current Medication: Outpatient Encounter Medications as of 09/11/2023  Medication Sig   carbamazepine (TEGRETOL) 200 MG tablet Take 1 tablet (200 mg total) by mouth 2 (two) times daily.   Cyanocobalamin (VITAMIN B 12 PO) Take 1 tablet by mouth daily.   methocarbamol (ROBAXIN) 750 MG tablet Take 1 tablet (750 mg total) by mouth 4 (four) times daily as needed for muscle spasms.   mupirocin ointment (BACTROBAN) 2 % Apply 1 Application topically 2 (two) times daily.   OVER THE COUNTER MEDICATION Take 2 capsules by mouth 2 (two) times daily. Super digestive enzymes   [DISCONTINUED] ALPRAZolam (XANAX) 0.5 MG tablet TAKE ONE TABLET BY MOUTH TWICE (2) DAILY AS NEEDED FOR ANXIETY   [DISCONTINUED] pregabalin (LYRICA) 50 MG capsule Take 1 capsule (50 mg total) by mouth 3 (three) times daily.   ALPRAZolam (XANAX) 0.5 MG tablet TAKE ONE TABLET BY MOUTH TWICE (2) DAILY AS NEEDED FOR ANXIETY   pregabalin (LYRICA) 50 MG capsule Take 1 capsule (50 mg total) by mouth 3 (three) times daily.   No facility-administered encounter medications on file as of 09/11/2023.    Surgical History: Past Surgical History:  Procedure Laterality Date   BREAST BIOPSY Left 1996   NEG   CESAREAN SECTION     CYSTOSCOPY/URETEROSCOPY/HOLMIUM LASER/STENT  PLACEMENT Right 08/13/2018   Procedure: CYSTOSCOPY/URETEROSCOPY/HOLMIUM LASER/STENT PLACEMENT;  Surgeon: Vanna Scotland, MD;  Location: [facility] ORS;  Service: Urology;  Laterality: Right;   FOOT SURGERY Right    mortons neuroma   Lower Extremity Arterial and Venous Dopplers Bilateral 07/2022    LE A Dopplers 07/16/2022: Bilat EIA, CFA, SFA, Pop A, TAT, PTA normal - no stenosis; LE V Dopplers 08/07/2022: No evidence of DVT in both lower extremities.  No evidence of superficial venous thrombosis or superficial venous reflux seen bilaterally.   TRANSTHORACIC ECHOCARDIOGRAM  08/07/2022   EF 60 to 65%.  Normal LV function.  No RWMA.  Normal diastolic parameters.  Normal RV size and function.  Normal mitral and aortic valve.  Mildly elevated RAP-pretty normal study.   TUBAL LIGATION     UMBILICAL HERNIA REPAIR     Zio Patch Event Monitor (14-day)     Predominant underlying rhythm was Sinus Rhythm: HR range 41-117 bpm, AVG 66 bpm   4 atrial runs: Fastest 6 beats- HR range 156-193 bpm, Avg 173 bpm, 1.7 sec; Longest 9 beats HR Range 106-133 bpm / Avg 118 bpm, 5.15 sec.   Rare isolated PACs and PVCs (<1%).  Rare couplets.  No triplets.  Short episode of ventricular trigeminy noted.  No Sustained Arrhythmias: ATach, SVT, A-fib/A-flutter, V Tach    Medical History: Past Medical History:  Diagnosis Date   Anxiety    Arthritis    neck   Family history of adverse reaction to anesthesia    dad can only stay sedated for  a certain amount of time per pt   Foot drop, right foot    WEARS BRACE   GERD (gastroesophageal reflux disease)    History of hiatal hernia    History of kidney stones    Peripheral neuropathy    TMJ (dislocation of temporomandibular joint)     Family History: Family History  Problem Relation Age of Onset   Cancer Mother    Diabetes Father    Hypertension Father    Congestive Heart Failure Sister    Cancer Brother    Breast cancer Neg Hx     Social History   Socioeconomic  History   Marital status: Married    Spouse name: Not on file   Number of children: Not on file   Years of education: Not on file   Highest education level: Not on file  Occupational History   Not on file  Tobacco Use   Smoking status: Former    Current packs/day: 0.00    Average packs/day: 0.5 packs/day for 40.0 years (20.0 ttl pk-yrs)    Types: Cigarettes    Start date: 08/06/1976    Quit date: 08/06/2016    Years since quitting: 7.1   Smokeless tobacco: Never  Vaping Use   Vaping status: Some Days  Substance and Sexual Activity   Alcohol use: No   Drug use: No   Sexual activity: Yes  Other Topics Concern   Not on file  Social History Narrative   Not on file   Social Determinants of Health   Financial Resource Strain: Not on file  Food Insecurity: Not on file  Transportation Needs: Not on file  Physical Activity: Sufficiently Active (07/29/2018)   Exercise Vital Sign    Days of Exercise per Week: 7 days    Minutes of Exercise per Session: 60 min  Stress: No Stress Concern Present (07/29/2018)   [employer] of Occupational Health - Occupational Stress Questionnaire    Feeling of Stress : Not at all  Social Connections: Somewhat Isolated (07/29/2018)   Social Connection and Isolation Panel [NHANES]    Frequency of Communication with Friends and Family: Three times a week    Frequency of Social Gatherings with Friends and Family: Three times a week    Attends Religious Services: Never    Active Member of Clubs or Organizations: No    Attends Banker Meetings: Never    Marital Status: Married  Catering manager Violence: Not At Risk (07/29/2018)   Humiliation, Afraid, Rape, and Kick questionnaire    Fear of Current or Ex-Partner: No    Emotionally Abused: No    Physically Abused: No    Sexually Abused: No      Review of Systems  Constitutional:  Negative for chills, fatigue and unexpected weight change.  HENT:  Negative for congestion, rhinorrhea,  sneezing and sore throat.   Eyes:  Negative for redness.  Respiratory:  Negative for cough, chest tightness, shortness of breath and wheezing.   Cardiovascular: Negative.  Negative for chest pain and palpitations.  Gastrointestinal: Negative.  Negative for abdominal pain, constipation, diarrhea, nausea and vomiting.  Genitourinary:  Negative for dysuria and frequency.  Musculoskeletal:  Positive for arthralgias. Negative for back pain, joint swelling and neck pain.       Facial pain from trigeminal neuralgia, treated with carbamazapine  Skin:  Negative for rash.  Neurological: Negative.  Negative for tremors and numbness.  Hematological:  Negative for adenopathy. Does not bruise/bleed easily.  Psychiatric/Behavioral:  Negative for behavioral problems (Depression), sleep disturbance and suicidal ideas. The patient is nervous/anxious.     Vital Signs: BP 128/72   Pulse 63   Temp 98.1 F (36.7 C)   Resp 16   Ht 5\' 5"  (1.651 m)   Wt 157 lb (71.2 kg)   SpO2 93%   BMI 26.13 kg/m    Physical Exam Vitals reviewed.  Constitutional:      General: Nicole Rich is not in acute distress.    Appearance: Normal appearance. Nicole Rich is not ill-appearing.  HENT:     Head: Normocephalic and atraumatic.  Eyes:     Pupils: Pupils are equal, round, and reactive to light.  Cardiovascular:     Rate and Rhythm: Normal rate and regular rhythm.  Pulmonary:     Effort: Pulmonary effort is normal. No respiratory distress.  Neurological:     Mental Status: Nicole Rich is alert and oriented to person, place, and time.  Psychiatric:        Mood and Affect: Mood normal.        Behavior: Behavior normal.        Assessment/Plan: 1. Trigeminal neuralgia of left side of face Continue carbamazepine and pregabalin as prescribed. Follow up in 3 months for additional refills of pregabalin - pregabalin (LYRICA) 50 MG capsule; Take 1 capsule (50 mg total) by mouth 3 (three) times daily.  Dispense: 90 capsule; Refill: 2  2.  Aortic atherosclerosis (HCC) Takes OTC fish oil, not currently on any cholesterol medication.   3. Anxiety disorder, unspecified type Continue alprazolam as prescribed, follow up in 3 months for additional refills. - ALPRAZolam (XANAX) 0.5 MG tablet; TAKE ONE TABLET BY MOUTH TWICE (2) DAILY AS NEEDED FOR ANXIETY  Dispense: 60 tablet; Refill: 2   General Counseling: reagon cardinas understanding of the findings of todays visit and agrees with plan of treatment. I have discussed any further diagnostic evaluation that may be needed or ordered today. We also reviewed Nicole Rich medications today. Nicole Rich has been encouraged to call the office with any questions or concerns that should arise related to todays visit.    No orders of the defined types were placed in this encounter.   Meds ordered this encounter  Medications   ALPRAZolam (XANAX) 0.5 MG tablet    Sig: TAKE ONE TABLET BY MOUTH TWICE (2) DAILY AS NEEDED FOR ANXIETY    Dispense:  60 tablet    Refill:  2   pregabalin (LYRICA) 50 MG capsule    Sig: Take 1 capsule (50 mg total) by mouth 3 (three) times daily.    Dispense:  90 capsule    Refill:  2    Return in about 12 weeks (around 12/04/2023) for F/U, anxiety med refill, pain med refill, Shanyia Stines PCP alprazolam and lyrica. .   Total time spent:30 Minutes Time spent includes review of chart, medications, test results, and follow up plan with the patient.   [place] Controlled Substance Database was reviewed by me.  This patient was seen by Sallyanne Kuster, FNP-C in collaboration with Dr. Beverely Risen as a part of collaborative care agreement.   Eryn Marandola R. Tedd Sias, MSN, FNP-C Internal medicine

## 2023-11-07 ENCOUNTER — Encounter: Payer: Self-pay | Admitting: Nurse Practitioner

## 2023-11-22 ENCOUNTER — Telehealth: Payer: Self-pay | Admitting: Nurse Practitioner

## 2023-11-22 NOTE — Telephone Encounter (Signed)
MB full, sent message regarding 12/04/23 appointment-Nicole Rich

## 2023-12-04 ENCOUNTER — Ambulatory Visit: Payer: Medicaid Other | Admitting: Internal Medicine

## 2023-12-12 ENCOUNTER — Other Ambulatory Visit: Payer: Self-pay | Admitting: Internal Medicine

## 2023-12-12 ENCOUNTER — Other Ambulatory Visit: Payer: Self-pay | Admitting: Nurse Practitioner

## 2023-12-12 DIAGNOSIS — F419 Anxiety disorder, unspecified: Secondary | ICD-10-CM

## 2023-12-12 NOTE — Telephone Encounter (Signed)
Modify therapy

## 2023-12-23 ENCOUNTER — Encounter: Payer: Self-pay | Admitting: Nurse Practitioner

## 2023-12-23 ENCOUNTER — Ambulatory Visit: Payer: Medicaid Other | Admitting: Nurse Practitioner

## 2023-12-23 VITALS — BP 130/72 | HR 67 | Temp 98.2°F | Resp 16 | Ht 65.0 in | Wt 152.4 lb

## 2023-12-23 DIAGNOSIS — G500 Trigeminal neuralgia: Secondary | ICD-10-CM | POA: Diagnosis not present

## 2023-12-23 DIAGNOSIS — G4709 Other insomnia: Secondary | ICD-10-CM

## 2023-12-23 DIAGNOSIS — F419 Anxiety disorder, unspecified: Secondary | ICD-10-CM | POA: Diagnosis not present

## 2023-12-23 DIAGNOSIS — G629 Polyneuropathy, unspecified: Secondary | ICD-10-CM

## 2023-12-23 MED ORDER — METHOCARBAMOL 750 MG PO TABS: 750.0000 mg | ORAL_TABLET | Freq: Four times a day (QID) | ORAL | 2 refills | Status: DC | PRN

## 2023-12-23 MED ORDER — ALPRAZOLAM 0.5 MG PO TABS: 0.5000 mg | ORAL_TABLET | Freq: Two times a day (BID) | ORAL | 2 refills | Status: DC | PRN

## 2023-12-23 MED ORDER — PREGABALIN 50 MG PO CAPS: 50.0000 mg | ORAL_CAPSULE | Freq: Three times a day (TID) | ORAL | 2 refills | Status: DC

## 2023-12-23 NOTE — Progress Notes (Cosign Needed)
[facility] [address] [place], [place] [address]  Internal MEDICINE  Office Visit Note  Patient Name: Nicole Rich  [id number]  [id number]  Date of Service: 12/23/2023  Chief Complaint  Patient presents with   Gastroesophageal Reflux   Follow-up    HPI Nicole Rich presents for a follow-up visit for trigeminal neuralgia, anxiety and insomnia.  Trigeminal neuralgia -- taking tegretol and lyrica, due for refills.  Anxiety -- taking alprazolam twice daily as needed. Due for refills.  Insomnia -- having trouble sleeping, does not want to take any prescription sleep medications, asking for OTC options.     Current Medication: Outpatient Encounter Medications as of 12/23/2023  Medication Sig   carbamazepine (TEGRETOL) 200 MG tablet Take 1 tablet (200 mg total) by mouth 2 (two) times daily.   Cyanocobalamin (VITAMIN B 12 PO) Take 1 tablet by mouth daily.   mupirocin ointment (BACTROBAN) 2 % Apply 1 Application topically 2 (two) times daily.   OVER THE COUNTER MEDICATION Take 2 capsules by mouth 2 (two) times daily. Super digestive enzymes   [DISCONTINUED] ALPRAZolam (XANAX) 0.5 MG tablet Take 1 tablet (0.5 mg total) by mouth 2 (two) times daily as needed for anxiety. for anxiety   [DISCONTINUED] methocarbamol (ROBAXIN) 750 MG tablet Take 1 tablet (750 mg total) by mouth 4 (four) times daily as needed for muscle spasms.   [DISCONTINUED] pregabalin (LYRICA) 50 MG capsule Take 1 capsule (50 mg total) by mouth 3 (three) times daily.   ALPRAZolam (XANAX) 0.5 MG tablet Take 1 tablet (0.5 mg total) by mouth 2 (two) times daily as needed for anxiety. for anxiety   methocarbamol (ROBAXIN) 750 MG tablet Take 1 tablet (750 mg total) by mouth 4 (four) times daily as needed for muscle spasms.   pregabalin (LYRICA) 50 MG capsule Take 1 capsule (50 mg total) by mouth 3 (three) times daily.   No facility-administered encounter medications on file as of 12/23/2023.    Surgical  History: Past Surgical History:  Procedure Laterality Date   BREAST BIOPSY Left 1996   NEG   CESAREAN SECTION     CYSTOSCOPY/URETEROSCOPY/HOLMIUM LASER/STENT PLACEMENT Right 08/13/2018   Procedure: CYSTOSCOPY/URETEROSCOPY/HOLMIUM LASER/STENT PLACEMENT;  Surgeon: Vanna Scotland, MD;  Location: [facility] ORS;  Service: Urology;  Laterality: Right;   FOOT SURGERY Right    mortons neuroma   Lower Extremity Arterial and Venous Dopplers Bilateral 07/2022    LE A Dopplers 07/16/2022: Bilat EIA, CFA, SFA, Pop A, TAT, PTA normal - no stenosis; LE V Dopplers 08/07/2022: No evidence of DVT in both lower extremities.  No evidence of superficial venous thrombosis or superficial venous reflux seen bilaterally.   TRANSTHORACIC ECHOCARDIOGRAM  08/07/2022   EF 60 to 65%.  Normal LV function.  No RWMA.  Normal diastolic parameters.  Normal RV size and function.  Normal mitral and aortic valve.  Mildly elevated RAP-pretty normal study.   TUBAL LIGATION     UMBILICAL HERNIA REPAIR     Zio Patch Event Monitor (14-day)     Predominant underlying rhythm was Sinus Rhythm: HR range 41-117 bpm, AVG 66 bpm   4 atrial runs: Fastest 6 beats- HR range 156-193 bpm, Avg 173 bpm, 1.7 sec; Longest 9 beats HR Range 106-133 bpm / Avg 118 bpm, 5.15 sec.   Rare isolated PACs and PVCs (<1%).  Rare couplets.  No triplets.  Short episode of ventricular trigeminy noted.  No Sustained Arrhythmias: ATach, SVT, A-fib/A-flutter, V Tach    Medical History: Past Medical History:  Diagnosis Date   Anxiety    Arthritis    neck   Family history of adverse reaction to anesthesia    dad can only stay sedated for a certain amount of time per pt   Foot drop, right foot    WEARS BRACE   GERD (gastroesophageal reflux disease)    History of hiatal hernia    History of kidney stones    Peripheral neuropathy    TMJ (dislocation of temporomandibular joint)     Family History: Family History  Problem Relation Age of Onset   Cancer Mother     Diabetes Father    Hypertension Father    Congestive Heart Failure Sister    Cancer Brother    Breast cancer Neg Hx     Social History   Socioeconomic History   Marital status: Married    Spouse name: Not on file   Number of children: Not on file   Years of education: Not on file   Highest education level: Not on file  Occupational History   Not on file  Tobacco Use   Smoking status: Former    Current packs/day: 0.00    Average packs/day: 0.5 packs/day for 40.0 years (20.0 ttl pk-yrs)    Types: Cigarettes    Start date: 08/06/1976    Quit date: 08/06/2016    Years since quitting: 7.3   Smokeless tobacco: Never  Vaping Use   Vaping status: Some Days  Substance and Sexual Activity   Alcohol use: No   Drug use: No   Sexual activity: Yes  Other Topics Concern   Not on file  Social History Narrative   Not on file   Social Drivers of Health   Financial Resource Strain: Not on file  Food Insecurity: Not on file  Transportation Needs: Not on file  Physical Activity: Sufficiently Active (07/29/2018)   Exercise Vital Sign    Days of Exercise per Week: 7 days    Minutes of Exercise per Session: 60 min  Stress: No Stress Concern Present (07/29/2018)   [employer] of Occupational Health - Occupational Stress Questionnaire    Feeling of Stress : Not at all  Social Connections: Somewhat Isolated (07/29/2018)   Social Connection and Isolation Panel [NHANES]    Frequency of Communication with Friends and Family: Three times a week    Frequency of Social Gatherings with Friends and Family: Three times a week    Attends Religious Services: Never    Active Member of Clubs or Organizations: No    Attends Banker Meetings: Never    Marital Status: Married  Catering manager Violence: Not At Risk (07/29/2018)   Humiliation, Afraid, Rape, and Kick questionnaire    Fear of Current or Ex-Partner: No    Emotionally Abused: No    Physically Abused: No    Sexually Abused:  No      Review of Systems  Constitutional:  Negative for chills, fatigue and unexpected weight change.  HENT:  Negative for congestion, rhinorrhea, sneezing and sore throat.   Eyes:  Negative for redness.  Respiratory:  Negative for cough, chest tightness, shortness of breath and wheezing.   Cardiovascular: Negative.  Negative for chest pain and palpitations.  Gastrointestinal: Negative.  Negative for abdominal pain, constipation, diarrhea, nausea and vomiting.  Genitourinary:  Negative for dysuria and frequency.  Musculoskeletal:  Positive for arthralgias. Negative for back pain, joint swelling and neck pain.       Facial pain from trigeminal neuralgia,  treated with carbamazapine  Skin:  Negative for rash.  Neurological: Negative.  Negative for tremors and numbness.  Hematological:  Negative for adenopathy. Does not bruise/bleed easily.  Psychiatric/Behavioral:  Negative for behavioral problems (Depression), sleep disturbance and suicidal ideas. The patient is nervous/anxious.     Vital Signs: BP 130/72   Pulse 67   Temp 98.2 F (36.8 C)   Resp 16   Ht 5\' 5"  (1.651 m)   Wt 152 lb 6.4 oz (69.1 kg)   SpO2 96%   BMI 25.36 kg/m    Physical Exam Vitals reviewed.  Constitutional:      General: She is not in acute distress.    Appearance: Normal appearance. She is not ill-appearing.  HENT:     Head: Normocephalic and atraumatic.  Eyes:     Pupils: Pupils are equal, round, and reactive to light.  Cardiovascular:     Rate and Rhythm: Normal rate and regular rhythm.  Pulmonary:     Effort: Pulmonary effort is normal. No respiratory distress.  Neurological:     Mental Status: She is alert and oriented to person, place, and time.  Psychiatric:        Mood and Affect: Mood normal.        Behavior: Behavior normal.        Assessment/Plan: 1. Trigeminal neuralgia of left side of face (Primary) Continue tegretol and lyrica as prescribed, refills ordered.  - pregabalin  (LYRICA) 50 MG capsule; Take 1 capsule (50 mg total) by mouth 3 (three) times daily.  Dispense: 90 capsule; Refill: 2  2. Neuropathy Continue methocarbamol as needed as prescribed. Refills ordered  - methocarbamol (ROBAXIN) 750 MG tablet; Take 1 tablet (750 mg total) by mouth 4 (four) times daily as needed for muscle spasms.  Dispense: 120 tablet; Refill: 2  3. Other insomnia Discussed trying OTC magnesium glycinate 200-420 mg at bedtime OR metatonin 3-10 mg at bed OR doxylamine 12.5-25 mg at bedtime. Patient said she will try one of these OTC options, recommended that patient try the magnesium glycinate first.  4. Anxiety disorder, unspecified type Continue alprazolam as needed as prescribed, refills ordered.  - ALPRAZolam (XANAX) 0.5 MG tablet; Take 1 tablet (0.5 mg total) by mouth 2 (two) times daily as needed for anxiety. for anxiety  Dispense: 60 tablet; Refill: 2   General Counseling: trista gadsden understanding of the findings of todays visit and agrees with plan of treatment. I have discussed any further diagnostic evaluation that may be needed or ordered today. We also reviewed her medications today. she has been encouraged to call the office with any questions or concerns that should arise related to todays visit.    No orders of the defined types were placed in this encounter.   Meds ordered this encounter  Medications   pregabalin (LYRICA) 50 MG capsule    Sig: Take 1 capsule (50 mg total) by mouth 3 (three) times daily.    Dispense:  90 capsule    Refill:  2    For future refills, keep on file.   ALPRAZolam (XANAX) 0.5 MG tablet    Sig: Take 1 tablet (0.5 mg total) by mouth 2 (two) times daily as needed for anxiety. for anxiety    Dispense:  60 tablet    Refill:  2    For future refills, keep on file   methocarbamol (ROBAXIN) 750 MG tablet    Sig: Take 1 tablet (750 mg total) by mouth 4 (four) times daily as  needed for muscle spasms.    Dispense:  120 tablet     Refill:  2    Fill today thank you    Return in about 3 months (around 03/18/2024) for F/U, anxiety med refill, pain med refill, Faizah Kandler PCP.   Total time spent:30 Minutes Time spent includes review of chart, medications, test results, and follow up plan with the patient.   [place] Controlled Substance Database was reviewed by me.  This patient was seen by Sallyanne Kuster, FNP-C in collaboration with Dr. Beverely Risen as a part of collaborative care agreement.   Rebacca Votaw R. Tedd Sias, MSN, FNP-C Internal medicine

## 2024-01-27 ENCOUNTER — Telehealth: Payer: Self-pay | Admitting: Nurse Practitioner

## 2024-01-27 NOTE — Telephone Encounter (Signed)
12/31/22-12/31/23 MR uploaded to [facility]

## 2024-02-25 ENCOUNTER — Telehealth: Payer: Self-pay | Admitting: Nurse Practitioner

## 2024-02-25 NOTE — Telephone Encounter (Signed)
 Left vm and sent mychart message to confirm 03/03/24 appointment-Toni

## 2024-03-03 ENCOUNTER — Encounter: Payer: Self-pay | Admitting: Nurse Practitioner

## 2024-03-03 ENCOUNTER — Ambulatory Visit: Payer: Medicaid Other | Admitting: Nurse Practitioner

## 2024-03-03 VITALS — BP 130/90 | HR 72 | Temp 98.0°F | Resp 16 | Ht 65.0 in | Wt 155.0 lb

## 2024-03-03 DIAGNOSIS — Z1211 Encounter for screening for malignant neoplasm of colon: Secondary | ICD-10-CM

## 2024-03-03 DIAGNOSIS — Z0001 Encounter for general adult medical examination with abnormal findings: Secondary | ICD-10-CM

## 2024-03-03 DIAGNOSIS — G500 Trigeminal neuralgia: Secondary | ICD-10-CM

## 2024-03-03 DIAGNOSIS — Z1212 Encounter for screening for malignant neoplasm of rectum: Secondary | ICD-10-CM

## 2024-03-03 DIAGNOSIS — E782 Mixed hyperlipidemia: Secondary | ICD-10-CM

## 2024-03-03 DIAGNOSIS — E538 Deficiency of other specified B group vitamins: Secondary | ICD-10-CM | POA: Diagnosis not present

## 2024-03-03 DIAGNOSIS — I700 Atherosclerosis of aorta: Secondary | ICD-10-CM

## 2024-03-03 DIAGNOSIS — G629 Polyneuropathy, unspecified: Secondary | ICD-10-CM

## 2024-03-03 DIAGNOSIS — F419 Anxiety disorder, unspecified: Secondary | ICD-10-CM

## 2024-03-03 DIAGNOSIS — E559 Vitamin D deficiency, unspecified: Secondary | ICD-10-CM

## 2024-03-03 DIAGNOSIS — Z87891 Personal history of nicotine dependence: Secondary | ICD-10-CM

## 2024-03-03 MED ORDER — ALPRAZOLAM 0.5 MG PO TABS: 0.5000 mg | ORAL_TABLET | Freq: Two times a day (BID) | ORAL | 2 refills | Status: DC | PRN

## 2024-03-03 MED ORDER — METHOCARBAMOL 750 MG PO TABS: 750.0000 mg | ORAL_TABLET | Freq: Four times a day (QID) | ORAL | 2 refills | Status: DC | PRN

## 2024-03-03 MED ORDER — CARBAMAZEPINE 200 MG PO TABS: 200.0000 mg | ORAL_TABLET | Freq: Two times a day (BID) | ORAL | 3 refills | Status: AC

## 2024-03-03 MED ORDER — PREGABALIN 50 MG PO CAPS: 50.0000 mg | ORAL_CAPSULE | Freq: Three times a day (TID) | ORAL | 2 refills | Status: DC

## 2024-03-03 NOTE — Progress Notes (Signed)
 [facility] [address] [place], [place] [address]  Internal MEDICINE  Office Visit Note  Patient Name: Nicole Rich  [id number]  [id number]  Date of Service: 03/03/2024  Chief Complaint  Patient presents with   Gastroesophageal Reflux   Annual Exam   Quality Metric Gaps    Colonoscopy    HPI Nicole Rich presents for an annual well visit and physical exam.  Well-appearing 63 y.o. female with GERD, trigeminal neuralgia, psoriasis and anxiety.  Routine CRC screening: due now  Routine mammogram: due in may 2026.  DEXA scan: due in 4 years  Pap smear: due in December 2026 Eye exam and/or foot exam: Labs: routine labs due  New or worsening pain: chronic pain with trigeminal neuralgia. Neurology referral [facility]? Other concerns:none     Current Medication: Outpatient Encounter Medications as of 03/03/2024  Medication Sig   Cyanocobalamin (VITAMIN B 12 PO) Take 1 tablet by mouth daily.   mupirocin ointment (BACTROBAN) 2 % Apply 1 Application topically 2 (two) times daily.   OVER THE COUNTER MEDICATION Take 2 capsules by mouth 2 (two) times daily. Super digestive enzymes   [DISCONTINUED] ALPRAZolam (XANAX) 0.5 MG tablet Take 1 tablet (0.5 mg total) by mouth 2 (two) times daily as needed for anxiety. for anxiety   [DISCONTINUED] carbamazepine (TEGRETOL) 200 MG tablet Take 1 tablet (200 mg total) by mouth 2 (two) times daily.   [DISCONTINUED] methocarbamol (ROBAXIN) 750 MG tablet Take 1 tablet (750 mg total) by mouth 4 (four) times daily as needed for muscle spasms.   [DISCONTINUED] pregabalin (LYRICA) 50 MG capsule Take 1 capsule (50 mg total) by mouth 3 (three) times daily.   ALPRAZolam (XANAX) 0.5 MG tablet Take 1 tablet (0.5 mg total) by mouth 2 (two) times daily as needed for anxiety. for anxiety   carbamazepine (TEGRETOL) 200 MG tablet Take 1 tablet (200 mg total) by mouth 2 (two) times daily.   methocarbamol (ROBAXIN) 750 MG tablet Take 1 tablet (750 mg total) by mouth 4  (four) times daily as needed for muscle spasms.   pregabalin (LYRICA) 50 MG capsule Take 1 capsule (50 mg total) by mouth 3 (three) times daily.   No facility-administered encounter medications on file as of 03/03/2024.    Surgical History: Past Surgical History:  Procedure Laterality Date   BREAST BIOPSY Left 1996   NEG   CESAREAN SECTION     CYSTOSCOPY/URETEROSCOPY/HOLMIUM LASER/STENT PLACEMENT Right 08/13/2018   Procedure: CYSTOSCOPY/URETEROSCOPY/HOLMIUM LASER/STENT PLACEMENT;  Surgeon: Vanna Scotland, MD;  Location: [facility] ORS;  Service: Urology;  Laterality: Right;   FOOT SURGERY Right    mortons neuroma   Lower Extremity Arterial and Venous Dopplers Bilateral 07/2022    LE A Dopplers 07/16/2022: Bilat EIA, CFA, SFA, Pop A, TAT, PTA normal - no stenosis; LE V Dopplers 08/07/2022: No evidence of DVT in both lower extremities.  No evidence of superficial venous thrombosis or superficial venous reflux seen bilaterally.   TRANSTHORACIC ECHOCARDIOGRAM  08/07/2022   EF 60 to 65%.  Normal LV function.  No RWMA.  Normal diastolic parameters.  Normal RV size and function.  Normal mitral and aortic valve.  Mildly elevated RAP-pretty normal study.   TUBAL LIGATION     UMBILICAL HERNIA REPAIR     Zio Patch Event Monitor (14-day)     Predominant underlying rhythm was Sinus Rhythm: HR range 41-117 bpm, AVG 66 bpm   4 atrial runs: Fastest 6 beats- HR range 156-193 bpm, Avg 173 bpm, 1.7 sec; Longest 9  beats HR Range 106-133 bpm / Avg 118 bpm, 5.15 sec.   Rare isolated PACs and PVCs (<1%).  Rare couplets.  No triplets.  Short episode of ventricular trigeminy noted.  No Sustained Arrhythmias: ATach, SVT, A-fib/A-flutter, V Tach    Medical History: Past Medical History:  Diagnosis Date   Anxiety    Arthritis    neck   Family history of adverse reaction to anesthesia    dad can only stay sedated for a certain amount of time per pt   Foot drop, right foot    WEARS BRACE   GERD (gastroesophageal  reflux disease)    History of hiatal hernia    History of kidney stones    Peripheral neuropathy    TMJ (dislocation of temporomandibular joint)     Family History: Family History  Problem Relation Age of Onset   Cancer Mother    Diabetes Father    Hypertension Father    Congestive Heart Failure Sister    Cancer Brother    Breast cancer Neg Hx     Social History   Socioeconomic History   Marital status: Married    Spouse name: Not on file   Number of children: Not on file   Years of education: Not on file   Highest education level: Not on file  Occupational History   Not on file  Tobacco Use   Smoking status: Former    Current packs/day: 0.00    Average packs/day: 0.5 packs/day for 40.0 years (20.0 ttl pk-yrs)    Types: Cigarettes    Start date: 08/06/1976    Quit date: 08/06/2016    Years since quitting: 7.5   Smokeless tobacco: Never  Vaping Use   Vaping status: Some Days  Substance and Sexual Activity   Alcohol use: No   Drug use: No   Sexual activity: Yes  Other Topics Concern   Not on file  Social History Narrative   Not on file   Social Drivers of Health   Financial Resource Strain: Not on file  Food Insecurity: Not on file  Transportation Needs: Not on file  Physical Activity: Sufficiently Active (07/29/2018)   Exercise Vital Sign    Days of Exercise per Week: 7 days    Minutes of Exercise per Session: 60 min  Stress: No Stress Concern Present (07/29/2018)   [employer] of Occupational Health - Occupational Stress Questionnaire    Feeling of Stress : Not at all  Social Connections: Somewhat Isolated (07/29/2018)   Social Connection and Isolation Panel [NHANES]    Frequency of Communication with Friends and Family: Three times a week    Frequency of Social Gatherings with Friends and Family: Three times a week    Attends Religious Services: Never    Active Member of Clubs or Organizations: No    Attends Banker Meetings: Never     Marital Status: Married  Catering manager Violence: Not At Risk (07/29/2018)   Humiliation, Afraid, Rape, and Kick questionnaire    Fear of Current or Ex-Partner: No    Emotionally Abused: No    Physically Abused: No    Sexually Abused: No      Review of Systems  Constitutional:  Negative for activity change, appetite change, chills, fatigue, fever and unexpected weight change.  HENT: Negative.  Negative for congestion, ear pain, rhinorrhea, sore throat and trouble swallowing.   Eyes: Negative.   Respiratory: Negative.  Negative for cough, chest tightness, shortness of breath and wheezing.  Cardiovascular: Negative.  Negative for chest pain.  Gastrointestinal: Negative.  Negative for abdominal pain, blood in stool, constipation, diarrhea, nausea and vomiting.  Endocrine: Negative.   Genitourinary: Negative.  Negative for difficulty urinating, dysuria, frequency, hematuria and urgency.  Musculoskeletal: Negative.  Negative for arthralgias, back pain, joint swelling, myalgias and neck pain.  Skin: Negative.  Negative for rash and wound.  Allergic/Immunologic: Negative.  Negative for immunocompromised state.  Neurological: Negative.  Negative for dizziness, seizures, numbness and headaches.  Hematological: Negative.   Psychiatric/Behavioral: Negative.  Negative for behavioral problems, self-injury and suicidal ideas. The patient is not nervous/anxious.     Vital Signs: BP (!) 130/90   Pulse 72   Temp 98 F (36.7 C)   Resp 16   Ht 5\' 5"  (1.651 m)   Wt 155 lb (70.3 kg)   SpO2 96%   BMI 25.79 kg/m    Physical Exam Vitals reviewed.  Constitutional:      General: She is awake. She is not in acute distress.    Appearance: Normal appearance. She is well-developed, well-groomed and overweight. She is not ill-appearing or diaphoretic.  HENT:     Head: Normocephalic and atraumatic.     Right Ear: Tympanic membrane, ear canal and external ear normal.     Left Ear: Tympanic  membrane, ear canal and external ear normal.     Nose: Nose normal. No congestion or rhinorrhea.     Mouth/Throat:     Lips: Pink.     Mouth: Mucous membranes are moist.     Pharynx: Oropharynx is clear. Uvula midline. No oropharyngeal exudate or posterior oropharyngeal erythema.  Eyes:     General: Lids are normal. Vision grossly intact. Gaze aligned appropriately. No scleral icterus.       Right eye: No discharge.        Left eye: No discharge.     Extraocular Movements: Extraocular movements intact.     Conjunctiva/sclera: Conjunctivae normal.     Pupils: Pupils are equal, round, and reactive to light.     Funduscopic exam:    Right eye: Red reflex present.        Left eye: Red reflex present. Neck:     Thyroid: No thyromegaly.     Vascular: No carotid bruit or JVD.     Trachea: Trachea and phonation normal. No tracheal deviation.  Cardiovascular:     Rate and Rhythm: Normal rate and regular rhythm.     Pulses: Normal pulses.     Heart sounds: Normal heart sounds, S1 normal and S2 normal. No murmur heard.    No friction rub. No gallop.  Pulmonary:     Effort: Pulmonary effort is normal. No accessory muscle usage or respiratory distress.     Breath sounds: Normal breath sounds and air entry. No stridor. No wheezing or rales.  Chest:     Chest wall: No tenderness.  Breasts:    Right: Normal. No swelling, bleeding, inverted nipple, mass, nipple discharge, skin change or tenderness.     Left: Normal. No swelling, bleeding, inverted nipple, mass, nipple discharge, skin change or tenderness.  Abdominal:     General: Bowel sounds are normal. There is no distension.     Palpations: Abdomen is soft. There is no shifting dullness, fluid wave, mass or pulsatile mass.     Tenderness: There is no abdominal tenderness. There is no guarding or rebound.  Musculoskeletal:        General: No tenderness or deformity. Normal range  of motion.     Cervical back: Normal range of motion and neck  supple.     Right lower leg: No edema.     Left lower leg: No edema.  Lymphadenopathy:     Cervical: No cervical adenopathy.     Upper Body:     Right upper body: No supraclavicular, axillary or pectoral adenopathy.     Left upper body: No supraclavicular, axillary or pectoral adenopathy.  Skin:    General: Skin is warm and dry.     Capillary Refill: Capillary refill takes less than 2 seconds.     Coloration: Skin is not pale.     Findings: No erythema or rash.  Neurological:     Mental Status: She is alert and oriented to person, place, and time.     Cranial Nerves: No cranial nerve deficit.     Motor: No abnormal muscle tone.     Coordination: Coordination normal.     Deep Tendon Reflexes: Reflexes are normal and symmetric.  Psychiatric:        Mood and Affect: Mood normal.        Behavior: Behavior normal. Behavior is cooperative.        Thought Content: Thought content normal.        Judgment: Judgment normal.        Assessment/Plan: 1. Encounter for routine adult health examination with abnormal findings (Primary) Age-appropriate preventive screenings and vaccinations discussed, annual physical exam completed. Routine labs for health maintenance ordered, see below. PHM updated.   - CBC with Differential/Platelet - CMP14+EGFR - Lipid Profile - B12 and Folate Panel - Vitamin D (25 hydroxy)  2. Trigeminal neuralgia of left side of face Continue carbamazepine and pregabalin as prescribed. Follow up in 3 months for additional refills of pregabalin.  - carbamazepine (TEGRETOL) 200 MG tablet; Take 1 tablet (200 mg total) by mouth 2 (two) times daily.  Dispense: 180 tablet; Refill: 3 - pregabalin (LYRICA) 50 MG capsule; Take 1 capsule (50 mg total) by mouth 3 (three) times daily.  Dispense: 90 capsule; Refill: 2  3. Aortic atherosclerosis (HCC) Routine labs ordered  - CBC with Differential/Platelet - CMP14+EGFR - Lipid Profile  4. Neuropathy Continue prn methocarbamol  as prescribed.  - methocarbamol (ROBAXIN) 750 MG tablet; Take 1 tablet (750 mg total) by mouth 4 (four) times daily as needed for muscle spasms.  Dispense: 120 tablet; Refill: 2  5. B12 deficiency Routine labs ordered  - CBC with Differential/Platelet - B12 and Folate Panel  6. Vitamin D deficiency Routine lab ordered  - Vitamin D (25 hydroxy)  7. Screening for colorectal cancer Referred to GI for colonoscopy  - Ambulatory referral to Gastroenterology  8. Stopped smoking with greater than 40 pack year history CT chest lung cancer screening ordered  - CT CHEST LUNG CA SCREEN LOW DOSE W/O CM; Future  9. Anxiety disorder, unspecified type Continue prn alprazolam as prescribed. Follow up in 3 months for additional refills  - ALPRAZolam (XANAX) 0.5 MG tablet; Take 1 tablet (0.5 mg total) by mouth 2 (two) times daily as needed for anxiety. for anxiety  Dispense: 60 tablet; Refill: 2    General Counseling: magdalena skilton understanding of the findings of todays visit and agrees with plan of treatment. I have discussed any further diagnostic evaluation that may be needed or ordered today. We also reviewed her medications today. she has been encouraged to call the office with any questions or concerns that should arise related to todays  visit.    Orders Placed This Encounter  Procedures   CT CHEST LUNG CA SCREEN LOW DOSE W/O CM   CBC with Differential/Platelet   CMP14+EGFR   Lipid Profile   B12 and Folate Panel   Vitamin D (25 hydroxy)   Ambulatory referral to Gastroenterology    Meds ordered this encounter  Medications   ALPRAZolam (XANAX) 0.5 MG tablet    Sig: Take 1 tablet (0.5 mg total) by mouth 2 (two) times daily as needed for anxiety. for anxiety    Dispense:  60 tablet    Refill:  2    For future refills, keep on file   carbamazepine (TEGRETOL) 200 MG tablet    Sig: Take 1 tablet (200 mg total) by mouth 2 (two) times daily.    Dispense:  180 tablet    Refill:  3     Risks and side effects discussed with patient, she has been on this medication before, please discontinue nortriptyline.   pregabalin (LYRICA) 50 MG capsule    Sig: Take 1 capsule (50 mg total) by mouth 3 (three) times daily.    Dispense:  90 capsule    Refill:  2    For future refills, keep on file.   methocarbamol (ROBAXIN) 750 MG tablet    Sig: Take 1 tablet (750 mg total) by mouth 4 (four) times daily as needed for muscle spasms.    Dispense:  120 tablet    Refill:  2    Fill today thank you    Return in about 3 months (around 05/27/2024) for F/U, anxiety med refill, Andreya Lacks PCP.   Total time spent:30 Minutes Time spent includes review of chart, medications, test results, and follow up plan with the patient.   [place] Controlled Substance Database was reviewed by me.  This patient was seen by Sallyanne Kuster, FNP-C in collaboration with Dr. Beverely Risen as a part of collaborative care agreement.  Tom Macpherson R. Tedd Sias, MSN, FNP-C Internal medicine

## 2024-03-04 ENCOUNTER — Telehealth: Payer: Self-pay | Admitting: Nurse Practitioner

## 2024-03-04 NOTE — Telephone Encounter (Signed)
 GI referral sent via Epic to [facility]. Highlighted for patient on AVS-Toni

## 2024-03-06 ENCOUNTER — Encounter: Payer: Self-pay | Admitting: Nurse Practitioner

## 2024-03-11 ENCOUNTER — Ambulatory Visit
Admission: RE | Admit: 2024-03-11 | Discharge: 2024-03-11 | Disposition: A | Discharge status: Home / Self Care | Source: Ambulatory Visit | Attending: Nurse Practitioner | Admitting: Nurse Practitioner

## 2024-03-11 DIAGNOSIS — Z87891 Personal history of nicotine dependence: Secondary | ICD-10-CM

## 2024-03-17 ENCOUNTER — Encounter: Payer: Self-pay | Admitting: *Deleted

## 2024-03-19 ENCOUNTER — Ambulatory Visit: Payer: Medicaid Other | Admitting: Nurse Practitioner

## 2024-03-28 LAB — CBC WITH DIFFERENTIAL/PLATELET
Basophils Absolute: 0 10*3/uL (ref 0.0–0.2)
Basos: 0 %
EOS (ABSOLUTE): 0.2 10*3/uL (ref 0.0–0.4)
Eos: 2 %
Hematocrit: 43 % (ref 34.0–46.6)
Hemoglobin: 14.6 g/dL (ref 11.1–15.9)
Immature Grans (Abs): 0 10*3/uL (ref 0.0–0.1)
Immature Granulocytes: 0 %
Lymphocytes Absolute: 2.4 10*3/uL (ref 0.7–3.1)
Lymphs: 34 %
MCH: 30.5 pg (ref 26.6–33.0)
MCHC: 34 g/dL (ref 31.5–35.7)
MCV: 90 fL (ref 79–97)
Monocytes Absolute: 0.6 10*3/uL (ref 0.1–0.9)
Monocytes: 9 %
Neutrophils Absolute: 4 10*3/uL (ref 1.4–7.0)
Neutrophils: 55 %
Platelets: 283 10*3/uL (ref 150–450)
RBC: 4.79 x10E6/uL (ref 3.77–5.28)
RDW: 12.9 % (ref 11.7–15.4)
WBC: 7.2 10*3/uL (ref 3.4–10.8)

## 2024-03-28 LAB — CMP14+EGFR
ALT: 11 IU/L (ref 0–32)
AST: 16 IU/L (ref 0–40)
Albumin: 4.3 g/dL (ref 3.9–4.9)
Alkaline Phosphatase: 105 IU/L (ref 44–121)
BUN/Creatinine Ratio: 15 (ref 12–28)
BUN: 11 mg/dL (ref 8–27)
Bilirubin Total: 0.3 mg/dL (ref 0.0–1.2)
CO2: 25 mmol/L (ref 20–29)
Calcium: 9.5 mg/dL (ref 8.7–10.3)
Chloride: 105 mmol/L (ref 96–106)
Creatinine, Ser: 0.75 mg/dL (ref 0.57–1.00)
Globulin, Total: 2.3 g/dL (ref 1.5–4.5)
Glucose: 79 mg/dL (ref 70–99)
Potassium: 4.6 mmol/L (ref 3.5–5.2)
Sodium: 143 mmol/L (ref 134–144)
Total Protein: 6.6 g/dL (ref 6.0–8.5)
eGFR: 91 mL/min/{1.73_m2} (ref >59)

## 2024-03-28 LAB — B12 AND FOLATE PANEL
Folate: 2 ng/mL — ABNORMAL LOW (ref >3.0)
Vitamin B-12: 369 pg/mL (ref 232–1245)

## 2024-03-28 LAB — VITAMIN D 25 HYDROXY (VIT D DEFICIENCY, FRACTURES): Vit D, 25-Hydroxy: 23.5 ng/mL — ABNORMAL LOW (ref 30.0–100.0)

## 2024-03-28 LAB — LIPID PANEL
Chol/HDL Ratio: 4.3 ratio (ref 0.0–4.4)
Cholesterol, Total: 223 mg/dL — ABNORMAL HIGH (ref 100–199)
HDL: 52 mg/dL (ref >39)
LDL Chol Calc (NIH): 140 mg/dL — ABNORMAL HIGH (ref 0–99)
Triglycerides: 176 mg/dL — ABNORMAL HIGH (ref 0–149)
VLDL Cholesterol Cal: 31 mg/dL (ref 5–40)

## 2024-04-29 ENCOUNTER — Telehealth: Payer: Self-pay | Admitting: Nurse Practitioner

## 2024-04-29 NOTE — Telephone Encounter (Signed)
 Received MR request from [facility] [employer] [facility]. Faxed them invoice for $24.25 for 36 pages-Toni

## 2024-05-07 ENCOUNTER — Telehealth: Payer: Self-pay | Admitting: Nurse Practitioner

## 2024-05-07 NOTE — Telephone Encounter (Signed)
 Lvm to return my call regarding GI referral.

## 2024-05-14 ENCOUNTER — Telehealth: Payer: Self-pay | Admitting: Nurse Practitioner

## 2024-05-14 NOTE — Telephone Encounter (Signed)
 06-13-2023-current MR emailed to [facility]; [email or web address]-Toni

## 2024-06-01 ENCOUNTER — Encounter: Payer: Self-pay | Admitting: *Deleted

## 2024-06-02 ENCOUNTER — Ambulatory Visit: Admitting: Nurse Practitioner

## 2024-06-02 ENCOUNTER — Encounter: Payer: Self-pay | Admitting: Nurse Practitioner

## 2024-06-02 VITALS — BP 120/70 | HR 70 | Temp 97.2°F | Resp 16 | Ht 65.0 in | Wt 160.0 lb

## 2024-06-02 DIAGNOSIS — E538 Deficiency of other specified B group vitamins: Secondary | ICD-10-CM | POA: Diagnosis not present

## 2024-06-02 DIAGNOSIS — G629 Polyneuropathy, unspecified: Secondary | ICD-10-CM | POA: Diagnosis not present

## 2024-06-02 DIAGNOSIS — F419 Anxiety disorder, unspecified: Secondary | ICD-10-CM | POA: Diagnosis not present

## 2024-06-02 DIAGNOSIS — G500 Trigeminal neuralgia: Secondary | ICD-10-CM | POA: Diagnosis not present

## 2024-06-02 MED ORDER — METHOCARBAMOL 750 MG PO TABS: 750.0000 mg | ORAL_TABLET | Freq: Four times a day (QID) | ORAL | 2 refills | Status: DC | PRN

## 2024-06-02 MED ORDER — PREGABALIN 50 MG PO CAPS: 50.0000 mg | ORAL_CAPSULE | Freq: Three times a day (TID) | ORAL | 2 refills | Status: DC

## 2024-06-02 MED ORDER — CYANOCOBALAMIN 1000 MCG/ML IJ SOLN
1000.0000 ug | Freq: Once | INTRAMUSCULAR | Status: AC
  Administered 2024-06-02: 1000 ug via INTRAMUSCULAR

## 2024-06-02 MED ORDER — FOLIC ACID 1 MG PO TABS: 1.0000 mg | ORAL_TABLET | Freq: Every day | ORAL | 3 refills | Status: AC

## 2024-06-02 MED ORDER — ALPRAZOLAM 0.5 MG PO TABS: 0.5000 mg | ORAL_TABLET | Freq: Two times a day (BID) | ORAL | 2 refills | Status: DC | PRN

## 2024-06-02 NOTE — Progress Notes (Signed)
 [facility] [address] [place], [place] [address]  Internal MEDICINE  Office Visit Note  Patient Name: Nicole Rich  [id number]  [id number]  Date of Service: 06/02/2024  Chief Complaint  Patient presents with   Follow-up   Anxiety   Quality Metric Gaps    Colonoscopy    HPI Safiyyah presents for a follow-up visit for trigeminal neuralgia, anxiety, lab results and refills.  Trigeminal neuralgia -- takes tegretol  and pregabalin .  Anxiety -- takes alprazolam  as needed, due for refills.  Recently had teeth pulled about 6 days ago, getting dentures soon. Pain is better but jaw is sore still. Hopefully will  Lab results -- low folate and low B12     Current Medication: Outpatient Encounter Medications as of 06/02/2024  Medication Sig   carbamazepine  (TEGRETOL ) 200 MG tablet Take 1 tablet (200 mg total) by mouth 2 (two) times daily.   Cyanocobalamin  (VITAMIN B 12 PO) Take 1 tablet by mouth daily.   folic acid  (FOLVITE ) 1 MG tablet Take 1 tablet (1 mg total) by mouth daily.   mupirocin  ointment (BACTROBAN ) 2 % Apply 1 Application topically 2 (two) times daily.   OVER THE COUNTER MEDICATION Take 2 capsules by mouth 2 (two) times daily. Super digestive enzymes   [DISCONTINUED] ALPRAZolam  (XANAX ) 0.5 MG tablet Take 1 tablet (0.5 mg total) by mouth 2 (two) times daily as needed for anxiety. for anxiety   [DISCONTINUED] methocarbamol  (ROBAXIN ) 750 MG tablet Take 1 tablet (750 mg total) by mouth 4 (four) times daily as needed for muscle spasms.   [DISCONTINUED] pregabalin  (LYRICA ) 50 MG capsule Take 1 capsule (50 mg total) by mouth 3 (three) times daily.   ALPRAZolam  (XANAX ) 0.5 MG tablet Take 1 tablet (0.5 mg total) by mouth 2 (two) times daily as needed for anxiety. for anxiety   methocarbamol  (ROBAXIN ) 750 MG tablet Take 1 tablet (750 mg total) by mouth 4 (four) times daily as needed for muscle spasms.   pregabalin  (LYRICA ) 50 MG capsule Take 1 capsule (50 mg total) by mouth 3  (three) times daily.   Facility-Administered Encounter Medications as of 06/02/2024  Medication   cyanocobalamin  (VITAMIN B12) injection 1,000 mcg    Surgical History: Past Surgical History:  Procedure Laterality Date   BREAST BIOPSY Left 1996   NEG   CESAREAN SECTION     CYSTOSCOPY/URETEROSCOPY/HOLMIUM LASER/STENT PLACEMENT Right 08/13/2018   Procedure: CYSTOSCOPY/URETEROSCOPY/HOLMIUM LASER/STENT PLACEMENT;  Surgeon: Penne Knee, MD;  Location: [facility] ORS;  Service: Urology;  Laterality: Right;   FOOT SURGERY Right    mortons neuroma   Lower Extremity Arterial and Venous Dopplers Bilateral 07/2022    LE A Dopplers 07/16/2022: Bilat EIA, CFA, SFA, Pop A, TAT, PTA normal - no stenosis; LE V Dopplers 08/07/2022: No evidence of DVT in both lower extremities.  No evidence of superficial venous thrombosis or superficial venous reflux seen bilaterally.   TRANSTHORACIC ECHOCARDIOGRAM  08/07/2022   EF 60 to 65%.  Normal LV function.  No RWMA.  Normal diastolic parameters.  Normal RV size and function.  Normal mitral and aortic valve.  Mildly elevated RAP-pretty normal study.   TUBAL LIGATION     UMBILICAL HERNIA REPAIR     Zio Patch Event Monitor (14-day)     Predominant underlying rhythm was Sinus Rhythm: HR range 41-117 bpm, AVG 66 bpm   4 atrial runs: Fastest 6 beats- HR range 156-193 bpm, Avg 173 bpm, 1.7 sec; Longest 9 beats HR Range 106-133 bpm / Avg 118  bpm, 5.15 sec.   Rare isolated PACs and PVCs (<1%).  Rare couplets.  No triplets.  Short episode of ventricular trigeminy noted.  No Sustained Arrhythmias: ATach, SVT, A-fib/A-flutter, V Tach    Medical History: Past Medical History:  Diagnosis Date   Anxiety    Arthritis    neck   Family history of adverse reaction to anesthesia    dad can only stay sedated for a certain amount of time per pt   Foot drop, right foot    WEARS BRACE   GERD (gastroesophageal reflux disease)    History of hiatal hernia    History of kidney stones     Peripheral neuropathy    TMJ (dislocation of temporomandibular joint)     Family History: Family History  Problem Relation Age of Onset   Cancer Mother    Diabetes Father    Hypertension Father    Congestive Heart Failure Sister    Cancer Brother    Breast cancer Neg Hx     Social History   Socioeconomic History   Marital status: Married    Spouse name: Not on file   Number of children: Not on file   Years of education: Not on file   Highest education level: Not on file  Occupational History   Not on file  Tobacco Use   Smoking status: Former    Current packs/day: 0.00    Average packs/day: 0.5 packs/day for 40.0 years (20.0 ttl pk-yrs)    Types: Cigarettes    Start date: 08/06/1976    Quit date: 08/06/2016    Years since quitting: 7.8   Smokeless tobacco: Never  Vaping Use   Vaping status: Some Days  Substance and Sexual Activity   Alcohol use: No   Drug use: No   Sexual activity: Yes  Other Topics Concern   Not on file  Social History Narrative   Not on file   Social Drivers of Health   Financial Resource Strain: Not on file  Food Insecurity: Not on file  Transportation Needs: Not on file  Physical Activity: Sufficiently Active (07/29/2018)   Exercise Vital Sign    Days of Exercise per Week: 7 days    Minutes of Exercise per Session: 60 min  Stress: No Stress Concern Present (07/29/2018)   [employer] of Occupational Health - Occupational Stress Questionnaire    Feeling of Stress : Not at all  Social Connections: Somewhat Isolated (07/29/2018)   Social Connection and Isolation Panel [NHANES]    Frequency of Communication with Friends and Family: Three times a week    Frequency of Social Gatherings with Friends and Family: Three times a week    Attends Religious Services: Never    Active Member of Clubs or Organizations: No    Attends Banker Meetings: Never    Marital Status: Married  Catering manager Violence: Not At Risk (07/29/2018)    Humiliation, Afraid, Rape, and Kick questionnaire    Fear of Current or Ex-Partner: No    Emotionally Abused: No    Physically Abused: No    Sexually Abused: No      Review of Systems  Constitutional:  Negative for chills, fatigue and unexpected weight change.  HENT:  Negative for congestion, rhinorrhea, sneezing and sore throat.   Eyes:  Negative for redness.  Respiratory:  Negative for cough, chest tightness, shortness of breath and wheezing.   Cardiovascular: Negative.  Negative for chest pain and palpitations.  Gastrointestinal: Negative.  Negative  for abdominal pain, constipation, diarrhea, nausea and vomiting.  Genitourinary:  Negative for dysuria and frequency.  Musculoskeletal:  Positive for arthralgias. Negative for back pain, joint swelling and neck pain.       Facial pain from trigeminal neuralgia, treated with carbamazapine  Skin:  Negative for rash.  Neurological: Negative.  Negative for tremors and numbness.  Hematological:  Negative for adenopathy. Does not bruise/bleed easily.  Psychiatric/Behavioral:  Negative for behavioral problems (Depression), sleep disturbance and suicidal ideas. The patient is nervous/anxious.     Vital Signs: BP 120/70   Pulse 70   Temp (!) 97.2 F (36.2 C)   Resp 16   Ht 5' 5 (1.651 m)   Wt 160 lb (72.6 kg)   SpO2 99%   BMI 26.63 kg/m    Physical Exam Vitals reviewed.  Constitutional:      General: She is not in acute distress.    Appearance: Normal appearance. She is not ill-appearing.  HENT:     Head: Normocephalic and atraumatic.  Eyes:     Pupils: Pupils are equal, round, and reactive to light.  Cardiovascular:     Rate and Rhythm: Normal rate and regular rhythm.  Pulmonary:     Effort: Pulmonary effort is normal. No respiratory distress.  Neurological:     Mental Status: She is alert and oriented to person, place, and time.  Psychiatric:        Mood and Affect: Mood normal.        Behavior: Behavior normal.         Assessment/Plan: 1. Trigeminal neuralgia of left side of face (Primary) Continue pregabalin  as prescirbed. Follow up in 3 months for addition refills. - pregabalin  (LYRICA ) 50 MG capsule; Take 1 capsule (50 mg total) by mouth 3 (three) times daily.  Dispense: 90 capsule; Refill: 2  2. Folate deficiency Continue folic acid  supplement as prescribed.  - folic acid  (FOLVITE ) 1 MG tablet; Take 1 tablet (1 mg total) by mouth daily.  Dispense: 90 tablet; Refill: 3  3. B12 deficiency B12 injection administered in office today  - cyanocobalamin  (VITAMIN B12) injection 1,000 mcg  4. Neuropathy Continue methocarbamol  as prescribed  - methocarbamol  (ROBAXIN ) 750 MG tablet; Take 1 tablet (750 mg total) by mouth 4 (four) times daily as needed for muscle spasms.  Dispense: 120 tablet; Refill: 2  5. Anxiety disorder, unspecified type Continue alprazolam  as prescribed. Follow up in 3 months for additional refills  - ALPRAZolam  (XANAX ) 0.5 MG tablet; Take 1 tablet (0.5 mg total) by mouth 2 (two) times daily as needed for anxiety. for anxiety  Dispense: 60 tablet; Refill: 2   General Counseling: carlye panameno understanding of the findings of todays visit and agrees with plan of treatment. I have discussed any further diagnostic evaluation that may be needed or ordered today. We also reviewed her medications today. she has been encouraged to call the office with any questions or concerns that should arise related to todays visit.    No orders of the defined types were placed in this encounter.   Meds ordered this encounter  Medications   pregabalin  (LYRICA ) 50 MG capsule    Sig: Take 1 capsule (50 mg total) by mouth 3 (three) times daily.    Dispense:  90 capsule    Refill:  2    For future refills, keep on file.   methocarbamol  (ROBAXIN ) 750 MG tablet    Sig: Take 1 tablet (750 mg total) by mouth 4 (four) times daily  as needed for muscle spasms.    Dispense:  120 tablet     Refill:  2    Fill today thank you   ALPRAZolam  (XANAX ) 0.5 MG tablet    Sig: Take 1 tablet (0.5 mg total) by mouth 2 (two) times daily as needed for anxiety. for anxiety    Dispense:  60 tablet    Refill:  2    For future refills, keep on file   cyanocobalamin  (VITAMIN B12) injection 1,000 mcg   folic acid  (FOLVITE ) 1 MG tablet    Sig: Take 1 tablet (1 mg total) by mouth daily.    Dispense:  90 tablet    Refill:  3    Fill new script today    Return in about 3 months (around 08/26/2024) for F/U, anxiety med refill, Gwyndolyn Guilford PCP alprazolam  and pregabalin . and monthly nurse visits for B12 inj.   Total time spent:30 Minutes Time spent includes review of chart, medications, test results, and follow up plan with the patient.   [place] Controlled Substance Database was reviewed by me.  This patient was seen by Mardy Maxin, FNP-C in collaboration with Dr. Sigrid Bathe as a part of collaborative care agreement.   Domenica Weightman R. Maxin, MSN, FNP-C Internal medicine

## 2024-06-30 ENCOUNTER — Ambulatory Visit (INDEPENDENT_AMBULATORY_CARE_PROVIDER_SITE_OTHER)

## 2024-06-30 DIAGNOSIS — E538 Deficiency of other specified B group vitamins: Secondary | ICD-10-CM | POA: Diagnosis not present

## 2024-06-30 MED ORDER — CYANOCOBALAMIN 1000 MCG/ML IJ SOLN
1000.0000 ug | Freq: Once | INTRAMUSCULAR | Status: AC
  Administered 2024-06-30: 1000 ug via INTRAMUSCULAR

## 2024-07-06 ENCOUNTER — Encounter: Payer: Self-pay | Admitting: Nurse Practitioner

## 2024-07-28 ENCOUNTER — Ambulatory Visit

## 2024-08-25 ENCOUNTER — Ambulatory Visit

## 2024-08-26 ENCOUNTER — Ambulatory Visit: Admitting: Nurse Practitioner

## 2024-08-27 ENCOUNTER — Encounter: Payer: Self-pay | Admitting: Nurse Practitioner

## 2024-08-27 ENCOUNTER — Ambulatory Visit (INDEPENDENT_AMBULATORY_CARE_PROVIDER_SITE_OTHER): Admitting: Nurse Practitioner

## 2024-08-27 VITALS — BP 122/70 | HR 57 | Temp 97.3°F | Resp 16 | Ht 65.0 in | Wt 153.4 lb

## 2024-08-27 DIAGNOSIS — F419 Anxiety disorder, unspecified: Secondary | ICD-10-CM

## 2024-08-27 DIAGNOSIS — Z1211 Encounter for screening for malignant neoplasm of colon: Secondary | ICD-10-CM

## 2024-08-27 DIAGNOSIS — G629 Polyneuropathy, unspecified: Secondary | ICD-10-CM

## 2024-08-27 DIAGNOSIS — G500 Trigeminal neuralgia: Secondary | ICD-10-CM

## 2024-08-27 DIAGNOSIS — Z1212 Encounter for screening for malignant neoplasm of rectum: Secondary | ICD-10-CM

## 2024-08-27 MED ORDER — ALPRAZOLAM 0.5 MG PO TABS: 0.5000 mg | ORAL_TABLET | Freq: Two times a day (BID) | ORAL | 2 refills | Status: AC | PRN

## 2024-08-27 MED ORDER — METHOCARBAMOL 750 MG PO TABS: 750.0000 mg | ORAL_TABLET | Freq: Four times a day (QID) | ORAL | 2 refills | Status: AC | PRN

## 2024-08-27 MED ORDER — PREGABALIN 50 MG PO CAPS: 50.0000 mg | ORAL_CAPSULE | Freq: Three times a day (TID) | ORAL | 2 refills | Status: AC

## 2024-08-27 NOTE — Progress Notes (Signed)
 [facility] [address] [place], [place] [address]  Internal MEDICINE  Office Visit Note  Patient Name: Nicole Rich  [id number]  [id number]  Date of Service: 08/27/2024  Chief Complaint  Patient presents with   Gastroesophageal Reflux   Follow-up    HPI Lima presents for a follow-up visit for trigeminal neuralgia, anxiety, screenings and refills. Due for CRC screening -- no family history of colon cancer, ok with cologuard test  Trigeminal neuralgia -- symptoms optimally controlled with carbamazapine and pregabalin  Anxiety -- taking alprazolam  as needed. Increased anxiety due to taking care of her father who has significant medical problems at age 39.     Current Medication: Outpatient Encounter Medications as of 08/27/2024  Medication Sig   ALPRAZolam  (XANAX ) 0.5 MG tablet Take 1 tablet (0.5 mg total) by mouth 2 (two) times daily as needed for anxiety. for anxiety   carbamazepine  (TEGRETOL ) 200 MG tablet Take 1 tablet (200 mg total) by mouth 2 (two) times daily.   Cyanocobalamin  (VITAMIN B 12 PO) Take 1 tablet by mouth daily.   folic acid  (FOLVITE ) 1 MG tablet Take 1 tablet (1 mg total) by mouth daily.   methocarbamol  (ROBAXIN ) 750 MG tablet Take 1 tablet (750 mg total) by mouth 4 (four) times daily as needed for muscle spasms.   mupirocin  ointment (BACTROBAN ) 2 % Apply 1 Application topically 2 (two) times daily.   OVER THE COUNTER MEDICATION Take 2 capsules by mouth 2 (two) times daily. Super digestive enzymes   pregabalin  (LYRICA ) 50 MG capsule Take 1 capsule (50 mg total) by mouth 3 (three) times daily.   [DISCONTINUED] ALPRAZolam  (XANAX ) 0.5 MG tablet Take 1 tablet (0.5 mg total) by mouth 2 (two) times daily as needed for anxiety. for anxiety   [DISCONTINUED] methocarbamol  (ROBAXIN ) 750 MG tablet Take 1 tablet (750 mg total) by mouth 4 (four) times daily as needed for muscle spasms.   [DISCONTINUED] pregabalin  (LYRICA ) 50 MG capsule Take 1 capsule (50 mg  total) by mouth 3 (three) times daily.   No facility-administered encounter medications on file as of 08/27/2024.    Surgical History: Past Surgical History:  Procedure Laterality Date   BREAST BIOPSY Left 1996   NEG   CESAREAN SECTION     CYSTOSCOPY/URETEROSCOPY/HOLMIUM LASER/STENT PLACEMENT Right 08/13/2018   Procedure: CYSTOSCOPY/URETEROSCOPY/HOLMIUM LASER/STENT PLACEMENT;  Surgeon: Penne Knee, MD;  Location: [facility] ORS;  Service: Urology;  Laterality: Right;   FOOT SURGERY Right    mortons neuroma   Lower Extremity Arterial and Venous Dopplers Bilateral 07/2022    LE A Dopplers 07/16/2022: Bilat EIA, CFA, SFA, Pop A, TAT, PTA normal - no stenosis; LE V Dopplers 08/07/2022: No evidence of DVT in both lower extremities.  No evidence of superficial venous thrombosis or superficial venous reflux seen bilaterally.   TRANSTHORACIC ECHOCARDIOGRAM  08/07/2022   EF 60 to 65%.  Normal LV function.  No RWMA.  Normal diastolic parameters.  Normal RV size and function.  Normal mitral and aortic valve.  Mildly elevated RAP-pretty normal study.   TUBAL LIGATION     UMBILICAL HERNIA REPAIR     Zio Patch Event Monitor (14-day)     Predominant underlying rhythm was Sinus Rhythm: HR range 41-117 bpm, AVG 66 bpm   4 atrial runs: Fastest 6 beats- HR range 156-193 bpm, Avg 173 bpm, 1.7 sec; Longest 9 beats HR Range 106-133 bpm / Avg 118 bpm, 5.15 sec.   Rare isolated PACs and PVCs (<1%).  Rare couplets.  No  triplets.  Short episode of ventricular trigeminy noted.  No Sustained Arrhythmias: ATach, SVT, A-fib/A-flutter, V Tach    Medical History: Past Medical History:  Diagnosis Date   Anxiety    Arthritis    neck   Family history of adverse reaction to anesthesia    dad can only stay sedated for a certain amount of time per pt   Foot drop, right foot    WEARS BRACE   GERD (gastroesophageal reflux disease)    History of hiatal hernia    History of kidney stones    Peripheral neuropathy    TMJ  (dislocation of temporomandibular joint)     Family History: Family History  Problem Relation Age of Onset   Cancer Mother    Diabetes Father    Hypertension Father    Congestive Heart Failure Sister    Cancer Brother    Breast cancer Neg Hx     Social History   Socioeconomic History   Marital status: Married    Spouse name: Not on file   Number of children: Not on file   Years of education: Not on file   Highest education level: Not on file  Occupational History   Not on file  Tobacco Use   Smoking status: Former    Current packs/day: 0.00    Average packs/day: 0.5 packs/day for 40.0 years (20.0 ttl pk-yrs)    Types: Cigarettes    Start date: 08/06/1976    Quit date: 08/06/2016    Years since quitting: 8.0   Smokeless tobacco: Never  Vaping Use   Vaping status: Some Days  Substance and Sexual Activity   Alcohol use: No   Drug use: No   Sexual activity: Yes  Other Topics Concern   Not on file  Social History Narrative   Not on file   Social Drivers of Health   Financial Resource Strain: Not on file  Food Insecurity: Not on file  Transportation Needs: Not on file  Physical Activity: Sufficiently Active (07/29/2018)   Exercise Vital Sign    Days of Exercise per Week: 7 days    Minutes of Exercise per Session: 60 min  Stress: No Stress Concern Present (07/29/2018)   [employer] of Occupational Health - Occupational Stress Questionnaire    Feeling of Stress : Not at all  Social Connections: Somewhat Isolated (07/29/2018)   Social Connection and Isolation Panel    Frequency of Communication with Friends and Family: Three times a week    Frequency of Social Gatherings with Friends and Family: Three times a week    Attends Religious Services: Never    Active Member of Clubs or Organizations: No    Attends Banker Meetings: Never    Marital Status: Married  Catering manager Violence: Not At Risk (07/29/2018)   Humiliation, Afraid, Rape, and Kick  questionnaire    Fear of Current or Ex-Partner: No    Emotionally Abused: No    Physically Abused: No    Sexually Abused: No      Review of Systems  Constitutional:  Negative for chills, fatigue and unexpected weight change.  HENT:  Negative for congestion, rhinorrhea, sneezing and sore throat.   Eyes:  Negative for redness.  Respiratory:  Negative for cough, chest tightness, shortness of breath and wheezing.   Cardiovascular: Negative.  Negative for chest pain and palpitations.  Gastrointestinal: Negative.  Negative for abdominal pain, constipation, diarrhea, nausea and vomiting.  Genitourinary:  Negative for dysuria and frequency.  Musculoskeletal:  Positive for arthralgias. Negative for back pain, joint swelling and neck pain.       Facial pain from trigeminal neuralgia, treated with carbamazapine  Skin:  Negative for rash.  Neurological: Negative.  Negative for tremors and numbness.  Hematological:  Negative for adenopathy. Does not bruise/bleed easily.  Psychiatric/Behavioral:  Negative for behavioral problems (Depression), sleep disturbance and suicidal ideas. The patient is nervous/anxious.     Vital Signs: BP 122/70   Pulse (!) 57   Temp (!) 97.3 F (36.3 C)   Resp 16   Ht 5' 5 (1.651 m)   Wt 153 lb 6.4 oz (69.6 kg)   SpO2 93%   BMI 25.53 kg/m    Physical Exam Vitals reviewed.  Constitutional:      General: She is not in acute distress.    Appearance: Normal appearance. She is not ill-appearing.  HENT:     Head: Normocephalic and atraumatic.  Eyes:     Pupils: Pupils are equal, round, and reactive to light.  Cardiovascular:     Rate and Rhythm: Normal rate and regular rhythm.  Pulmonary:     Effort: Pulmonary effort is normal. No respiratory distress.  Neurological:     Mental Status: She is alert and oriented to person, place, and time.  Psychiatric:        Mood and Affect: Mood normal.        Behavior: Behavior normal.         Assessment/Plan: 1. Trigeminal neuralgia of left side of face (Primary) Continue tegretol  and pregabalin  as prescribed. Follow up in 3 months for additional refills of pregabalin .  - pregabalin  (LYRICA ) 50 MG capsule; Take 1 capsule (50 mg total) by mouth 3 (three) times daily.  Dispense: 90 capsule; Refill: 2  2. Neuropathy Continue methocarbamol  as prescribed.  - methocarbamol  (ROBAXIN ) 750 MG tablet; Take 1 tablet (750 mg total) by mouth 4 (four) times daily as needed for muscle spasms.  Dispense: 120 tablet; Refill: 2  3. Screening for colorectal cancer Cologuard test ordered - Cologuard  4. Anxiety disorder, unspecified type Continue prn alprazolam  as prescribed, follow up in 3 months for additional refills. - ALPRAZolam  (XANAX ) 0.5 MG tablet; Take 1 tablet (0.5 mg total) by mouth 2 (two) times daily as needed for anxiety. for anxiety  Dispense: 60 tablet; Refill: 2   General Counseling: orian amberg understanding of the findings of todays visit and agrees with plan of treatment. I have discussed any further diagnostic evaluation that may be needed or ordered today. We also reviewed her medications today. she has been encouraged to call the office with any questions or concerns that should arise related to todays visit.    Orders Placed This Encounter  Procedures   Cologuard    Meds ordered this encounter  Medications   ALPRAZolam  (XANAX ) 0.5 MG tablet    Sig: Take 1 tablet (0.5 mg total) by mouth 2 (two) times daily as needed for anxiety. for anxiety    Dispense:  60 tablet    Refill:  2    For future refills, keep on file   methocarbamol  (ROBAXIN ) 750 MG tablet    Sig: Take 1 tablet (750 mg total) by mouth 4 (four) times daily as needed for muscle spasms.    Dispense:  120 tablet    Refill:  2    Fill today thank you   pregabalin  (LYRICA ) 50 MG capsule    Sig: Take 1 capsule (50 mg total) by mouth  3 (three) times daily.    Dispense:  90 capsule     Refill:  2    For future refills, keep on file.    Return in about 3 months (around 11/18/2024) for F/U, anxiety med refill, pain med refill, Kelissa Merlin PCP.   Total time spent:30 Minutes Time spent includes review of chart, medications, test results, and follow up plan with the patient.   [place] Controlled Substance Database was reviewed by me.  This patient was seen by Mardy Maxin, FNP-C in collaboration with Dr. Sigrid Bathe as a part of collaborative care agreement.   Mihran Lebarron R. Maxin, MSN, FNP-C Internal medicine

## 2024-08-28 ENCOUNTER — Encounter: Payer: Self-pay | Admitting: Nurse Practitioner

## 2024-09-13 LAB — COLOGUARD: COLOGUARD: NEGATIVE

## 2024-09-22 ENCOUNTER — Ambulatory Visit (INDEPENDENT_AMBULATORY_CARE_PROVIDER_SITE_OTHER)

## 2024-09-22 DIAGNOSIS — E538 Deficiency of other specified B group vitamins: Secondary | ICD-10-CM | POA: Diagnosis not present

## 2024-09-22 MED ORDER — CYANOCOBALAMIN 1000 MCG/ML IJ SOLN
1000.0000 ug | Freq: Once | INTRAMUSCULAR | Status: AC
  Administered 2024-09-22: 1000 ug via INTRAMUSCULAR

## 2024-10-27 ENCOUNTER — Ambulatory Visit: Payer: Self-pay

## 2024-10-29 ENCOUNTER — Encounter: Payer: Self-pay | Admitting: *Deleted

## 2024-11-18 ENCOUNTER — Ambulatory Visit: Payer: Self-pay | Admitting: Nurse Practitioner

## 2024-11-24 ENCOUNTER — Ambulatory Visit: Payer: Self-pay

## 2025-03-04 ENCOUNTER — Encounter: Payer: Self-pay | Admitting: Nurse Practitioner
